# Patient Record
Sex: Male | Born: 1950 | Race: Black or African American | Hispanic: No | Marital: Married | Smoking: Never smoker
Health system: Southern US, Community
[De-identification: ages and names within clinical notes are randomized; demographics above are authoritative.]

## PROBLEM LIST (undated history)

## (undated) DIAGNOSIS — E785 Hyperlipidemia, unspecified: Secondary | ICD-10-CM

## (undated) DIAGNOSIS — I10 Essential (primary) hypertension: Secondary | ICD-10-CM

## (undated) DIAGNOSIS — J189 Pneumonia, unspecified organism: Secondary | ICD-10-CM

## (undated) DIAGNOSIS — E119 Type 2 diabetes mellitus without complications: Secondary | ICD-10-CM

## (undated) DIAGNOSIS — R7303 Prediabetes: Secondary | ICD-10-CM

## (undated) DIAGNOSIS — I7121 Aneurysm of the ascending aorta, without rupture: Secondary | ICD-10-CM

## (undated) DIAGNOSIS — I499 Cardiac arrhythmia, unspecified: Secondary | ICD-10-CM

## (undated) HISTORY — PX: CARDIAC CATHETERIZATION: SHX172

## (undated) HISTORY — PX: OTHER SURGICAL HISTORY: SHX169

---

## 2004-05-21 ENCOUNTER — Emergency Department (HOSPITAL_COMMUNITY): Admission: EM | Admit: 2004-05-21 | Discharge: 2004-05-21 | Payer: Self-pay | Admitting: Emergency Medicine

## 2004-05-23 ENCOUNTER — Emergency Department (HOSPITAL_COMMUNITY): Admission: EM | Admit: 2004-05-23 | Discharge: 2004-05-23 | Payer: Self-pay | Admitting: Emergency Medicine

## 2004-05-30 ENCOUNTER — Ambulatory Visit: Payer: Self-pay | Admitting: Family Medicine

## 2004-06-12 ENCOUNTER — Ambulatory Visit: Payer: Self-pay | Admitting: *Deleted

## 2004-06-23 ENCOUNTER — Ambulatory Visit: Payer: Self-pay | Admitting: Cardiology

## 2004-06-23 ENCOUNTER — Encounter (HOSPITAL_COMMUNITY): Admission: RE | Admit: 2004-06-23 | Discharge: 2004-07-23 | Payer: Self-pay | Admitting: *Deleted

## 2004-06-23 ENCOUNTER — Ambulatory Visit: Payer: Self-pay | Admitting: Family Medicine

## 2004-06-30 ENCOUNTER — Ambulatory Visit: Payer: Self-pay | Admitting: *Deleted

## 2004-07-13 ENCOUNTER — Inpatient Hospital Stay (HOSPITAL_BASED_OUTPATIENT_CLINIC_OR_DEPARTMENT_OTHER): Admission: RE | Admit: 2004-07-13 | Discharge: 2004-07-13 | Payer: Self-pay | Admitting: *Deleted

## 2004-07-13 ENCOUNTER — Ambulatory Visit: Payer: Self-pay | Admitting: *Deleted

## 2004-07-27 ENCOUNTER — Ambulatory Visit: Payer: Self-pay | Admitting: *Deleted

## 2005-02-06 ENCOUNTER — Ambulatory Visit: Payer: Self-pay | Admitting: Family Medicine

## 2005-05-16 ENCOUNTER — Ambulatory Visit: Payer: Self-pay | Admitting: Family Medicine

## 2005-05-17 ENCOUNTER — Encounter (HOSPITAL_COMMUNITY): Admission: RE | Admit: 2005-05-17 | Discharge: 2005-06-16 | Payer: Self-pay | Admitting: Family Medicine

## 2005-11-29 ENCOUNTER — Ambulatory Visit: Payer: Self-pay | Admitting: Family Medicine

## 2006-01-10 ENCOUNTER — Ambulatory Visit: Payer: Self-pay | Admitting: Family Medicine

## 2006-04-29 ENCOUNTER — Ambulatory Visit: Payer: Self-pay | Admitting: Family Medicine

## 2006-04-30 ENCOUNTER — Encounter (INDEPENDENT_AMBULATORY_CARE_PROVIDER_SITE_OTHER): Payer: Self-pay | Admitting: Family Medicine

## 2006-04-30 LAB — CONVERTED CEMR LAB
BUN: 21 mg/dL (ref 6–23)
CO2: 26 meq/L (ref 19–32)
Calcium: 9.8 mg/dL (ref 8.4–10.5)
Chloride: 101 meq/L (ref 96–112)
Creatinine, Ser: 1.36 mg/dL (ref 0.40–1.50)
Glucose, Bld: 91 mg/dL (ref 70–99)
Potassium: 4.8 meq/L (ref 3.5–5.3)
Sodium: 141 meq/L (ref 135–145)

## 2006-07-29 ENCOUNTER — Ambulatory Visit: Payer: Self-pay | Admitting: Family Medicine

## 2006-07-29 DIAGNOSIS — K429 Umbilical hernia without obstruction or gangrene: Secondary | ICD-10-CM | POA: Insufficient documentation

## 2006-07-29 DIAGNOSIS — R7989 Other specified abnormal findings of blood chemistry: Secondary | ICD-10-CM | POA: Insufficient documentation

## 2006-07-29 DIAGNOSIS — E785 Hyperlipidemia, unspecified: Secondary | ICD-10-CM | POA: Insufficient documentation

## 2006-07-29 DIAGNOSIS — I10 Essential (primary) hypertension: Secondary | ICD-10-CM | POA: Insufficient documentation

## 2006-07-29 LAB — CONVERTED CEMR LAB
Cholesterol, target level: 200 mg/dL
HDL goal, serum: 40 mg/dL
LDL Goal: 130 mg/dL

## 2006-08-27 ENCOUNTER — Encounter (INDEPENDENT_AMBULATORY_CARE_PROVIDER_SITE_OTHER): Payer: Self-pay | Admitting: Family Medicine

## 2006-09-27 ENCOUNTER — Ambulatory Visit: Payer: Self-pay | Admitting: Family Medicine

## 2006-11-08 ENCOUNTER — Ambulatory Visit: Payer: Self-pay | Admitting: Family Medicine

## 2006-12-10 ENCOUNTER — Encounter (INDEPENDENT_AMBULATORY_CARE_PROVIDER_SITE_OTHER): Payer: Self-pay | Admitting: Family Medicine

## 2006-12-11 LAB — CONVERTED CEMR LAB
ALT: 19 units/L (ref 0–53)
AST: 19 units/L (ref 0–37)
Albumin: 4.5 g/dL (ref 3.5–5.2)
Alkaline Phosphatase: 81 units/L (ref 39–117)
BUN: 13 mg/dL (ref 6–23)
Basophils Absolute: 0 10*3/uL (ref 0.0–0.1)
Basophils Relative: 1 % (ref 0–1)
CO2: 27 meq/L (ref 19–32)
Calcium: 9.5 mg/dL (ref 8.4–10.5)
Chloride: 103 meq/L (ref 96–112)
Cholesterol: 173 mg/dL (ref 0–200)
Creatinine, Ser: 1.41 mg/dL (ref 0.40–1.50)
Eosinophils Absolute: 0.1 10*3/uL (ref 0.0–0.7)
Eosinophils Relative: 2 % (ref 0–5)
Glucose, Bld: 106 mg/dL — ABNORMAL HIGH (ref 70–99)
HCT: 46.9 % (ref 39.0–52.0)
HDL: 45 mg/dL (ref >39)
Hemoglobin: 15.6 g/dL (ref 13.0–17.0)
LDL Cholesterol: 102 mg/dL — ABNORMAL HIGH (ref 0–99)
Lymphocytes Relative: 43 % (ref 12–46)
Lymphs Abs: 1.8 10*3/uL (ref 0.7–3.3)
MCHC: 33.3 g/dL (ref 30.0–36.0)
MCV: 93.6 fL (ref 78.0–100.0)
Monocytes Absolute: 0.3 10*3/uL (ref 0.2–0.7)
Monocytes Relative: 8 % (ref 3–11)
Neutro Abs: 2 10*3/uL (ref 1.7–7.7)
Neutrophils Relative %: 48 % (ref 43–77)
PSA: 1.61 ng/mL (ref 0.10–4.00)
Platelets: 255 10*3/uL (ref 150–400)
Potassium: 4.3 meq/L (ref 3.5–5.3)
RBC: 5.01 M/uL (ref 4.22–5.81)
RDW: 12.6 % (ref 11.5–14.0)
Sodium: 141 meq/L (ref 135–145)
TSH: 1.548 microintl units/mL (ref 0.350–5.50)
Total Bilirubin: 0.9 mg/dL (ref 0.3–1.2)
Total CHOL/HDL Ratio: 3.8
Total Protein: 7.5 g/dL (ref 6.0–8.3)
Triglycerides: 129 mg/dL (ref <150)
VLDL: 26 mg/dL (ref 0–40)
WBC: 4.2 10*3/uL (ref 4.0–10.5)

## 2006-12-13 ENCOUNTER — Ambulatory Visit: Payer: Self-pay | Admitting: Family Medicine

## 2006-12-13 LAB — CONVERTED CEMR LAB: OCCULT 1: NEGATIVE

## 2007-03-14 ENCOUNTER — Ambulatory Visit: Payer: Self-pay | Admitting: Family Medicine

## 2007-03-17 ENCOUNTER — Telehealth (INDEPENDENT_AMBULATORY_CARE_PROVIDER_SITE_OTHER): Payer: Self-pay | Admitting: *Deleted

## 2007-03-26 ENCOUNTER — Emergency Department (HOSPITAL_COMMUNITY): Admission: EM | Admit: 2007-03-26 | Discharge: 2007-03-26 | Payer: Self-pay | Admitting: Emergency Medicine

## 2007-04-10 ENCOUNTER — Encounter: Payer: Self-pay | Admitting: Family Medicine

## 2007-06-20 ENCOUNTER — Ambulatory Visit: Payer: Self-pay | Admitting: Family Medicine

## 2007-06-21 ENCOUNTER — Encounter (INDEPENDENT_AMBULATORY_CARE_PROVIDER_SITE_OTHER): Payer: Self-pay | Admitting: Family Medicine

## 2007-06-21 LAB — CONVERTED CEMR LAB
BUN: 12 mg/dL (ref 6–23)
CO2: 21 meq/L (ref 19–32)
Calcium: 8.8 mg/dL (ref 8.4–10.5)
Chloride: 102 meq/L (ref 96–112)
Creatinine, Ser: 1.09 mg/dL (ref 0.40–1.50)
Glucose, Bld: 83 mg/dL (ref 70–99)
Potassium: 4.5 meq/L (ref 3.5–5.3)
Sodium: 137 meq/L (ref 135–145)

## 2007-06-23 ENCOUNTER — Telehealth (INDEPENDENT_AMBULATORY_CARE_PROVIDER_SITE_OTHER): Payer: Self-pay | Admitting: *Deleted

## 2007-06-24 ENCOUNTER — Encounter (INDEPENDENT_AMBULATORY_CARE_PROVIDER_SITE_OTHER): Payer: Self-pay | Admitting: Family Medicine

## 2007-07-28 ENCOUNTER — Telehealth (INDEPENDENT_AMBULATORY_CARE_PROVIDER_SITE_OTHER): Payer: Self-pay | Admitting: Family Medicine

## 2007-07-28 ENCOUNTER — Ambulatory Visit: Payer: Self-pay | Admitting: Family Medicine

## 2007-07-28 DIAGNOSIS — J301 Allergic rhinitis due to pollen: Secondary | ICD-10-CM | POA: Insufficient documentation

## 2007-07-28 LAB — CONVERTED CEMR LAB
Bilirubin Urine: NEGATIVE
Blood in Urine, dipstick: NEGATIVE
Glucose, Urine, Semiquant: NEGATIVE
Nitrite: NEGATIVE
Protein, U semiquant: NEGATIVE
Specific Gravity, Urine: 1.02
Urobilinogen, UA: 0.2
WBC Urine, dipstick: NEGATIVE
pH: 5.5

## 2007-08-04 ENCOUNTER — Telehealth (INDEPENDENT_AMBULATORY_CARE_PROVIDER_SITE_OTHER): Payer: Self-pay | Admitting: Family Medicine

## 2007-08-04 ENCOUNTER — Ambulatory Visit: Payer: Self-pay | Admitting: Family Medicine

## 2007-08-25 ENCOUNTER — Ambulatory Visit: Payer: Self-pay | Admitting: Family Medicine

## 2007-08-25 DIAGNOSIS — R609 Edema, unspecified: Secondary | ICD-10-CM | POA: Insufficient documentation

## 2007-09-26 ENCOUNTER — Ambulatory Visit: Payer: Self-pay | Admitting: Family Medicine

## 2007-11-07 ENCOUNTER — Ambulatory Visit: Payer: Self-pay | Admitting: Family Medicine

## 2008-01-02 ENCOUNTER — Ambulatory Visit: Payer: Self-pay | Admitting: Family Medicine

## 2008-04-13 ENCOUNTER — Encounter (INDEPENDENT_AMBULATORY_CARE_PROVIDER_SITE_OTHER): Payer: Self-pay | Admitting: Family Medicine

## 2008-04-16 ENCOUNTER — Ambulatory Visit: Payer: Self-pay | Admitting: Family Medicine

## 2008-05-27 ENCOUNTER — Encounter (INDEPENDENT_AMBULATORY_CARE_PROVIDER_SITE_OTHER): Payer: Self-pay | Admitting: Family Medicine

## 2008-05-31 ENCOUNTER — Ambulatory Visit: Payer: Self-pay | Admitting: Family Medicine

## 2008-05-31 LAB — CONVERTED CEMR LAB
ALT: 20 units/L (ref 0–53)
AST: 18 units/L (ref 0–37)
Albumin: 4.5 g/dL (ref 3.5–5.2)
Alkaline Phosphatase: 82 units/L (ref 39–117)
BUN: 14 mg/dL (ref 6–23)
Basophils Absolute: 0 10*3/uL (ref 0.0–0.1)
Basophils Relative: 1 % (ref 0–1)
CO2: 29 meq/L (ref 19–32)
Calcium: 10 mg/dL (ref 8.4–10.5)
Chloride: 102 meq/L (ref 96–112)
Cholesterol: 191 mg/dL (ref 0–200)
Creatinine, Ser: 1.21 mg/dL (ref 0.40–1.50)
Eosinophils Absolute: 0.1 10*3/uL (ref 0.0–0.7)
Eosinophils Relative: 2 % (ref 0–5)
Glucose, Bld: 84 mg/dL (ref 70–99)
HCT: 48.5 % (ref 39.0–52.0)
HDL: 45 mg/dL (ref >39)
Hemoglobin: 15.7 g/dL (ref 13.0–17.0)
LDL Cholesterol: 95 mg/dL (ref 0–99)
Lymphocytes Relative: 37 % (ref 12–46)
Lymphs Abs: 2.1 10*3/uL (ref 0.7–4.0)
MCHC: 32.4 g/dL (ref 30.0–36.0)
MCV: 95.5 fL (ref 78.0–100.0)
Monocytes Absolute: 0.7 10*3/uL (ref 0.1–1.0)
Monocytes Relative: 11 % (ref 3–12)
Neutro Abs: 2.9 10*3/uL (ref 1.7–7.7)
Neutrophils Relative %: 50 % (ref 43–77)
PSA: 2.87 ng/mL (ref 0.10–4.00)
Platelets: 254 10*3/uL (ref 150–400)
Potassium: 4.3 meq/L (ref 3.5–5.3)
RBC: 5.08 M/uL (ref 4.22–5.81)
RDW: 12.6 % (ref 11.5–15.5)
Sodium: 140 meq/L (ref 135–145)
TSH: 1.414 microintl units/mL (ref 0.350–4.50)
Total Bilirubin: 0.8 mg/dL (ref 0.3–1.2)
Total CHOL/HDL Ratio: 4.2
Total Protein: 7.6 g/dL (ref 6.0–8.3)
Triglycerides: 253 mg/dL — ABNORMAL HIGH (ref <150)
VLDL: 51 mg/dL — ABNORMAL HIGH (ref 0–40)
WBC: 5.8 10*3/uL (ref 4.0–10.5)

## 2008-06-04 ENCOUNTER — Encounter (INDEPENDENT_AMBULATORY_CARE_PROVIDER_SITE_OTHER): Payer: Self-pay | Admitting: Family Medicine

## 2008-06-07 ENCOUNTER — Encounter (INDEPENDENT_AMBULATORY_CARE_PROVIDER_SITE_OTHER): Payer: Self-pay | Admitting: Family Medicine

## 2008-06-07 LAB — CONVERTED CEMR LAB
OCCULT 1: NEGATIVE
OCCULT 2: NEGATIVE
OCCULT 3: NEGATIVE

## 2008-08-31 ENCOUNTER — Ambulatory Visit: Payer: Self-pay | Admitting: Family Medicine

## 2008-11-04 ENCOUNTER — Encounter (INDEPENDENT_AMBULATORY_CARE_PROVIDER_SITE_OTHER): Payer: Self-pay | Admitting: Family Medicine

## 2008-11-30 ENCOUNTER — Ambulatory Visit: Payer: Self-pay | Admitting: Family Medicine

## 2009-01-31 ENCOUNTER — Encounter (INDEPENDENT_AMBULATORY_CARE_PROVIDER_SITE_OTHER): Payer: Self-pay | Admitting: Family Medicine

## 2010-01-03 ENCOUNTER — Encounter: Payer: Self-pay | Admitting: Gastroenterology

## 2010-01-09 ENCOUNTER — Telehealth (INDEPENDENT_AMBULATORY_CARE_PROVIDER_SITE_OTHER): Payer: Self-pay

## 2010-02-03 ENCOUNTER — Encounter (INDEPENDENT_AMBULATORY_CARE_PROVIDER_SITE_OTHER): Payer: Self-pay

## 2010-02-16 ENCOUNTER — Encounter: Payer: Self-pay | Admitting: Gastroenterology

## 2010-02-28 ENCOUNTER — Ambulatory Visit: Payer: Self-pay | Admitting: Gastroenterology

## 2010-02-28 ENCOUNTER — Ambulatory Visit (HOSPITAL_COMMUNITY)
Admission: RE | Admit: 2010-02-28 | Discharge: 2010-02-28 | Payer: Self-pay | Source: Home / Self Care | Admitting: Gastroenterology

## 2010-05-09 NOTE — Assessment & Plan Note (Signed)
Summary: 6 week follow up/arc   Vital Signs:  Patient Profile:   60 Years Old Male Height:     73 inches Weight:      267 pounds BMI:     35.35 O2 Sat:      98 % Temp:     97 degrees F Pulse rate:   84 / minute Resp:     14 per minute BP sitting:   137 / 85  Vitals Entered By: Sherilyn Banker (November 07, 2007 3:57 PM)                 PCP:  Franchot Heidelberg, MD  Chief Complaint:  follow up visit.  History of Present Illness: Pt in for recheck.  He comes in today and states has done well.  He has had a terrible bout of allergic rhinits and stuffed ears. He is using Nasonex and Loratdine and states quit use as worked well. He denies headache, dizzyness and sore throat. He states he likes the nasonex but makes him gag. He denies sneezing, ictchy eyes and sore throat. He is happy with progress. He denies side-effects on Rx except for gag.   He did not bring stool cards back. States forgot as he went on vacation. Has good apetite. Denies nausea and vomitting. No Diarrhea or constipation. No bloody stools.  He now presents.  Hypertension History:      He denies headache, chest pain, palpitations, dyspnea with exertion, orthopnea, PND, peripheral edema, visual symptoms, and neurologic problems.  He notes no problems with any antihypertensive medication side effects.        Positive major cardiovascular risk factors include male age 1 years old or older, hyperlipidemia, and hypertension.  Negative major cardiovascular risk factors include no history of diabetes, negative family history for ischemic heart disease, and non-tobacco-user status.        Further assessment for target organ damage reveals no history of ASHD, stroke/TIA, or peripheral vascular disease.    Lipid Management History:      Positive NCEP/ATP III risk factors include male age 32 years old or older and hypertension.  Negative NCEP/ATP III risk factors include non-diabetic, no family history for ischemic heart  disease, non-tobacco-user status, no ASHD (atherosclerotic heart disease), no prior stroke/TIA, no peripheral vascular disease, and no history of aortic aneurysm.        The patient states that he knows about the "Therapeutic Lifestyle Change" diet.  His compliance with the TLC diet is fair.  The patient expresses understanding of adjunctive measures for cholesterol lowering.  Adjunctive measures started by the patient include aerobic exercise, fiber, and omega-3 supplements.        Prior Medications Reviewed Using: Patient Recall  Updated Prior Medication List: AMLODIPINE BESYLATE 5 MG  TABS (AMLODIPINE BESYLATE) One daily LISINOPRIL-HYDROCHLOROTHIAZIDE 20-12.5 MG  TABS (LISINOPRIL-HYDROCHLOROTHIAZIDE) One daily FISH OIL 1000 MG CAPS (OMEGA-3 FATTY ACIDS) once daily FLAX SEED OIL 1000 MG CAPS (FLAXSEED (LINSEED)) once daily ASPIR-LOW 81 MG TBEC (ASPIRIN) One daily NASONEX 50 MCG/ACT  SUSP (MOMETASONE FUROATE) 2 squirts per nostril once daily LORATADINE 10 MG  TABS (LORATADINE) One daily  Current Allergies (reviewed today): ! * IVP DYE  Past Medical History:    Reviewed history from 06/20/2007 and no changes required:       Current Problems:        MORBID OBESITY (ICD-278.01)       LEG EDEMA, BILATERAL (ICD-782.3)       EUSTACHIAN TUBE DYSFUNCTION (ICD-381.81)  ALLERGIC RHINITIS, SEASONAL (ICD-477.0)       HYPERTENSION (ICD-401)       HERNIA, UMBILICAL (ICD-553.1)       HYPERGLYCEMIA (ICD-790.6)       HYPERLIPIDEMIA (ICD-272.4)                Past Surgical History:    Reviewed history from 09/26/2007 and no changes required:       Denies surgical history       Heart cat - 2006 - normal coronaries    Risk Factors: Tobacco use:  never Drug use:  no Alcohol use:  no  Family History Risk Factors:    Family History of MI in females < 12 years old:  no    Family History of MI in males < 42 years old:  no   Review of Systems  General      Denies chills, fever, and  sweats.  Resp      Denies cough, shortness of breath, sputum productive, and wheezing.  GI      See HPI  GU      Denies nocturia, urinary frequency, and urinary hesitancy.  Neuro      Denies brief paralysis, falling down, headaches, memory loss, seizures, tremors, and weakness.   Physical Exam  General:     Well-developed,well-nourished,in no acute distress; alert,appropriate and cooperative throughout examination. Obese. Lungs:     Normal respiratory effort, chest expands symmetrically. Lungs are clear to auscultation, no crackles or wheezes. Heart:     Normal rate and regular rhythm. S1 and S2 normal without gallop, murmur, click, rub or other extra sounds. Abdomen:     Bowel sounds positive,abdomen soft and non-tender without masses, organomegaly or hernias noted. Extremities:     No clubbing, cyanosis, edema, or deformity noted with normal full range of motion of all joints.   Cervical Nodes:     No lymphadenopathy noted Psych:     Cognition and judgment appear intact. Alert and cooperative with normal attention span and concentration. No apparent delusions, illusions, hallucinations    Impression & Recommendations:  Problem # 1:  HYPERTENSION (ICD-401) Stable. Rx as is. TLC a must. His updated medication list for this problem includes:    Amlodipine Besylate 5 Mg Tabs (Amlodipine besylate) ..... One daily    Lisinopril-hydrochlorothiazide 20-12.5 Mg Tabs (Lisinopril-hydrochlorothiazide) ..... One daily           Problem # 2:  MORBID OBESITY (ICD-278.01) Councelled again on diet,exersize, portion control and caloric intake limit of 2000 calories per day. States will try harder. Aware how eeight loss can improve morbidity and mortality.  Problem # 3:  ALLERGIC RHINITIS, SEASONAL (ICD-477.0) Resume daily Claaritin and try vermayst given "PND and gag" on other nasal spray.   Problem # 4:  EUSTACHIAN TUBE DYSFUNCTION (ICD-381.81) Resolved. Cont allergy  optomization.  Problem # 5:  Preventive Health Care (ICD-V70.0) Failed to bring heme cards back again. Agrees to do so nect week. Aware of cancer and death risk. Declines colonoscopy sighting cost today.  Complete Medication List: 1)  Amlodipine Besylate 5 Mg Tabs (Amlodipine besylate) .... One daily 2)  Lisinopril-hydrochlorothiazide 20-12.5 Mg Tabs (Lisinopril-hydrochlorothiazide) .... One daily 3)  Fish Oil 1000 Mg Caps (Omega-3 fatty acids) .... Once daily 4)  Flax Seed Oil 1000 Mg Caps (Flaxseed (linseed)) .... Once daily 5)  Aspir-low 81 Mg Tbec (Aspirin) .... One daily 6)  Nasonex 50 Mcg/act Susp (Mometasone furoate) .... 2 squirts per nostril once daily 7)  Loratadine  10 Mg Tabs (Loratadine) .... One daily  Hypertension Assessment/Plan:      The patient's hypertensive risk group is category B: At least one risk factor (excluding diabetes) with no target organ damage.  His calculated 10 year risk of coronary heart disease is 11 %.  Today's blood pressure is 137/85.  His blood pressure goal is < 140/90.  Lipid Assessment/Plan:      Based on NCEP/ATP III, the patient's risk factor category is "2 or more risk factors and a calculated 10 year CAD risk of < 20%".  From this information, the patient's calculated lipid goals are as follows: Total cholesterol goal is 200; LDL cholesterol goal is 130; HDL cholesterol goal is 40; Triglyceride goal is 150.     Patient Instructions: 1)  Please schedule a follow-up appointment in 2 months.   ]

## 2010-05-09 NOTE — Assessment & Plan Note (Signed)
Summary: 3 MON F/U   Vital Signs:  Patient Profile:   60 Years Old Male Height:     73 inches Weight:      267 pounds BMI:     35.35 O2 Sat:      98 % Temp:     97.6 degrees F Pulse rate:   89 / minute Resp:     14 per minute BP sitting:   146 / 95  Vitals Entered By: Sherilyn Banker (March 14, 2007 3:47 PM)                 PCP:  Franchot Heidelberg, MD  Chief Complaint:  follow up visit.  History of Present Illness: Pt in for recheck.  He states he has begun exersize and has been weight lifting. He is eating healthier including fruits and veggies. He notes he has been juicing on fruits and loves this. His weight has gonbe up from previous 261 to 267 with Thanksgiving.  He admits he never brought stool cards back and he will bring this back. He will get this back asap. He still does not want the colonscopy. His apetite is good. Denies nausea and vomititng. No diarrhea or constipation. No bloody stool.  Now presents.  Hypertension History:      He denies headache, chest pain, palpitations, dyspnea with exertion, orthopnea, PND, peripheral edema, visual symptoms, neurologic problems, syncope, and side effects from treatment.  Further comments include: Has missed BP pills last two days. Marland Kitchen        Positive major cardiovascular risk factors include male age 51 years old or older, hyperlipidemia, and hypertension.  Negative major cardiovascular risk factors include no history of diabetes, negative family history for ischemic heart disease, and non-tobacco-user status.        Further assessment for target organ damage reveals no history of ASHD, stroke/TIA, or peripheral vascular disease.    Lipid Management History:      Positive NCEP/ATP III risk factors include male age 69 years old or older and hypertension.  Negative NCEP/ATP III risk factors include non-diabetic, no family history for ischemic heart disease, non-tobacco-user status, no ASHD (atherosclerotic heart disease), no  prior stroke/TIA, no peripheral vascular disease, and no history of aortic aneurysm.        The patient states that he knows about the "Therapeutic Lifestyle Change" diet.  His compliance with the TLC diet is fair.  The patient expresses understanding of adjunctive measures for cholesterol lowering.  Adjunctive measures started by the patient include aerobic exercise, fiber, ASA, and omega-3 supplements.      Current Allergies (reviewed today): ! * IVP DYE  Past Medical History:    Reviewed history from 07/29/2006 and no changes required:       Hyperlipidemia       Hypertension  Past Surgical History:    Reviewed history from 07/29/2006 and no changes required:       Denies surgical history   Family History:    Reviewed history from 07/29/2006 and no changes required:       Father: CAD; CVA; MI Dead at 2       Mother: CVA; DM Dead at 68       1 sister: Colon CA 28  Social History:    Reviewed history from 07/29/2006 and no changes required:       Married       Never Smoked       Alcohol use-no  Drug use-no       Occupation: Best boy for [employer]    Review of Systems      See HPI  General      Denies fatigue and malaise.  Resp      Denies cough, shortness of breath, sputum productive, and wheezing.  GI      Denies abdominal pain, constipation, diarrhea, nausea, and vomiting.  GU      Denies decreased libido, nocturia, urinary frequency, and urinary hesitancy.   Physical Exam  General:     Well-developed,well-nourished,in no acute distress; alert,appropriate and cooperative throughout examination Lungs:     Normal respiratory effort, chest expands symmetrically. Lungs are clear to auscultation, no crackles or wheezes. Heart:     Normal rate and regular rhythm. S1 and S2 normal without gallop, murmur, click, rub or other extra sounds. Abdomen:     Bowel sounds positive,abdomen soft and non-tender without masses, organomegaly. Small umbilical  hernia noted. No strangulation. Extremities:     No clubbing, cyanosis, edema, or deformity noted with normal full range of motion of all joints.   Psych:     Cognition and judgment appear intact. Alert and cooperative with normal attention span and concentration. No apparent delusions, illusions, hallucinations    Impression & Recommendations:  Problem # 1:  HYPERTENSION (ICD-401) Discussed.  He is not using meds daily. Councelled risk. Agrees he will try harder. Advised stroke and CAD risk. Limit salt. Exersize anmd weight loss a must. Aware. His updated medication list for this problem includes:    Norvasc 5 Mg Tabs (Amlodipine besylate) ..... Once daily    Lisinopril-hydrochlorothiazide 20-25 Mg Tabs (Lisinopril-hydrochlorothiazide) ..... Once daily   Problem # 2:  HYPERGLYCEMIA (ICD-790.6) See above.  Problem # 3:  HYPERLIPIDEMIA (ICD-272.4) TLC only. Councelled on diet and exersize and low fat intake.  Problem # 4:  Preventive Health Care (ICD-V70.0) Discussed. Needs heme-occults. Refuses colonoscopy.Aware of cancer risk.   Problem # 5:  Non compliance Discussed. Advised need to work with [place] as we cannot optomize his care unless he helps [place]. He is aware. Await stool cards. Take BP meds daily.  Complete Medication List: 1)  Norvasc 5 Mg Tabs (Amlodipine besylate) .... Once daily 2)  Lisinopril-hydrochlorothiazide 20-25 Mg Tabs (Lisinopril-hydrochlorothiazide) .... Once daily 3)  Fish Oil 1000 Mg Caps (Omega-3 fatty acids) .... Once daily 4)  Flax Seed Oil 1000 Mg Caps (Flaxseed (linseed)) .... Once daily 5)  Aspir-low 81 Mg Tbec (Aspirin) .... One daily  Hypertension Assessment/Plan:      The patient's hypertensive risk group is category B: At least one risk factor (excluding diabetes) with no target organ damage.  His calculated 10 year risk of coronary heart disease is 11 %.  Today's blood pressure is 146/95.  His blood pressure goal is < 140/90.  Lipid  Assessment/Plan:      Based on NCEP/ATP III, the patient's risk factor category is "2 or more risk factors and a calculated 10 year CAD risk of < 20%".  From this information, the patient's calculated lipid goals are as follows: Total cholesterol goal is 200; LDL cholesterol goal is 130; HDL cholesterol goal is 40; Triglyceride goal is 150.     Patient Instructions: 1)  Please schedule a follow-up appointment in 3 months.    Prescriptions: LISINOPRIL-HYDROCHLOROTHIAZIDE 20-25 MG TABS (LISINOPRIL-HYDROCHLOROTHIAZIDE) once daily  #30 x 3   Entered and Authorized by:   Franchot Heidelberg MD   Signed by:   Franchot Heidelberg  MD on 03/14/2007   Method used:   Print then Give to Patient   RxID:   [id number] NORVASC 5 MG TABS (AMLODIPINE BESYLATE) once daily  #30 x 3   Entered and Authorized by:   Franchot Heidelberg MD   Signed by:   Franchot Heidelberg MD on 03/14/2007   Method used:   Print then Give to Patient   RxID:   [id number]  ]

## 2010-05-09 NOTE — Assessment & Plan Note (Signed)
Summary: FOLLOW UP/ARC   Vital Signs:  Patient Profile:   60 Years Old Male Height:     73 inches Weight:      261 pounds BMI:     34.56 O2 Sat:      99 % Temp:     97.6 degrees F Pulse rate:   100 / minute Resp:     16 per minute BP sitting:   136 / 88  Vitals Entered By: Sherilyn Banker (September 27, 2006 4:32 PM)               PCP:  Franchot Heidelberg, MD  Chief Complaint:  follow up visit.  History of Present Illness: Pt in for recheck.  He notes he is doing well.  He has HTN and is using meds as directed. he is hopeful he can get BP to 120/80 and stop meds with diet and exersize. he notes no chest pain, shortness of breath, orthopnea, PND and ankle edema. Taking meds daily. Not exersizing too much. No side-effects on medication.  He is due for lipids as he have a hx of this and has not had it done in 2 years. he has been putting a lot of his care of sighting cost concerns after a heart eval  that he is till paying for. he did call his insuarnce company and was told they will not pay for PSA but $70. They could not tell him about colon cancer screen unless I provided code for this. Curios if I can give him this.  He notes he is very reluctant to have evals due to cost concerns and I should understand he does not want to end up broke with tests.  Now presents.  Hypertension History:      He denies headache, chest pain, palpitations, dyspnea with exertion, orthopnea, PND, peripheral edema, visual symptoms, neurologic problems, syncope, and side effects from treatment.  He notes no problems with any antihypertensive medication side effects.        Positive major cardiovascular risk factors include male age 35 years old or older, hyperlipidemia, and hypertension.  Negative major cardiovascular risk factors include no history of diabetes, negative family history for ischemic heart disease, and non-tobacco-user status.        Further assessment for target organ damage reveals no  history of ASHD, stroke/TIA, or peripheral vascular disease.    Lipid Management History:      Positive NCEP/ATP III risk factors include male age 94 years old or older and hypertension.  Negative NCEP/ATP III risk factors include non-diabetic, no family history for ischemic heart disease, non-tobacco-user status, no ASHD (atherosclerotic heart disease), no prior stroke/TIA, no peripheral vascular disease, and no history of aortic aneurysm.        The patient states that he knows about the "Therapeutic Lifestyle Change" diet.  His compliance with the TLC diet is fair.  The patient expresses understanding of adjunctive measures for cholesterol lowering.  Adjunctive measures started by the patient include aerobic exercise, fiber, ASA, and omega-3 supplements.  Comments: Due for lipids.    Current Allergies (reviewed today): ! * IVP DYE   Family History:    Reviewed history from 07/29/2006 and no changes required:       Father: CAD; CVA; MI Dead at 69       Mother: CVA; DM Dead at 80       1 sister: Colon CA 93  Social History:    Reviewed history from 07/29/2006 and no changes  required:       Married       Never Smoked       Alcohol use-no       Drug use-no       Occupation: Best boy for [employer]    Review of Systems      See HPI  General      Denies fatigue and malaise.  Resp      Denies cough, shortness of breath, sputum productive, and wheezing.  GI      Denies abdominal pain, change in bowel habits, constipation, dark tarry stools, diarrhea, nausea, and vomiting.  GU      Denies decreased libido, erectile dysfunction, nocturia, urinary frequency, and urinary hesitancy.  Psych      Denies anxiety and depression.   Physical Exam  General:     Well-developed,well-nourished,in no acute distress; alert,appropriate and cooperative throughout examination Lungs:     Normal respiratory effort, chest expands symmetrically. Lungs are clear to auscultation, no  crackles or wheezes. Heart:     Normal rate and regular rhythm. S1 and S2 normal without gallop, murmur, click, rub or other extra sounds. Abdomen:     Bowel sounds positive,abdomen soft and non-tender without masses, organomegaly. Small umbilical hernia noted. No strangulation. Extremities:     No clubbing, cyanosis, edema, or deformity noted with normal full range of motion of all joints.      Impression & Recommendations:  Problem # 1:  HYPERTENSION (ICD-401) Discussed. BP at goal per JNC 7. Councelled diet, exersize and low salt intake. Advised yearly eye exam and educated on need for dental care. Advised we can certainly look at backing down on meds if he can get BP down with weight loss and TLC. His updated medication list for this problem includes:    Norvasc 5 Mg Tabs (Amlodipine besylate) ..... Once daily    Lisinopril-hydrochlorothiazide 20-25 Mg Tabs (Lisinopril-hydrochlorothiazide) ..... Once daily  Orders: T-CBC w/Diff ([id number])   Problem # 2:  HYPERLIPIDEMIA (ICD-272.4) Discussed need to optomize given risk for atherosclerosis. Councelled diet and exersize. Repeat lipid and optomize per atp iii. He is going to check with insurance on cost of tests and out of pocket expense before decoiding if he wants test. Aware of risk and benfit of not getting this checked. Orders: T-Comprehensive Metabolic Panel [phone]) T-Lipid Profile [phone]) T-TSH [phone])   Problem # 3:  HERNIA, UMBILICAL (ICD-553.1) Stable. Follow. Discussed signs of complication and need to notify me immediately if any change. Not open to surgical eval at this time.  Problem # 4:  Preventive Health Care (ICD-V70.0) Discussed PSA, Recta and colonoscopy. Reviewed USPTF guidelines on screnning and risk for malignancy and death  if not done. He again notes he will check with insurance before doing anything as he does not want expense. Councelled risk and benefits. He is well aware. Advised I  cannot help him if he does not do anything due to cost - this wastes his time and money. He will think about this and check with insurance. he will then get back to me - hence we will see him in 6 weeks and optomize as he allows.   Other Orders: T-PSA  ([id number])  Hypertension Assessment/Plan:      The patient's hypertensive risk group is category B: At least one risk factor (excluding diabetes) with no target organ damage.  Today's blood pressure is 136/88.  His blood pressure goal is < 140/90.  Lipid Assessment/Plan:  Based on NCEP/ATP III, the patient's risk factor category is "2 or more risk factors and a calculated 10 year CAD risk of < 20%".  From this information, the patient's calculated lipid goals are as follows: Total cholesterol goal is 200; LDL cholesterol goal is 130; HDL cholesterol goal is 40; Triglyceride goal is 150.     Patient Instructions: 1)  Please schedule a follow-up appointment in 6 weeks.

## 2010-05-09 NOTE — Assessment & Plan Note (Signed)
Summary: 3 month follow up/arc   Vital Signs:  Patient profile:   60 year old male Height:      73 inches Weight:      261 pounds O2 Sat:      96 % Pulse rate:   88 / minute Resp:     20 per minute BP sitting:   130 / 86  (left arm) Cuff size:   large  Vitals Entered By: Carlye Grippe (November 30, 2008 3:52 PM) CC: follow-up visit BP, Lipid Management, Hypertension Management   Primary Provider:  Franchot Heidelberg, MD  CC:  follow-up visit BP, Lipid Management, and Hypertension Management.  History of Present Illness: Pt in for rcheck.  He notes he is doing well.  He admits he still misses an occasional dose on meds and states knows better.  He denies side-effects on medications. He denies chest pain, SOB, orthopnea, PND and palpitations. Denies leg swelling. Notes socks may dent shin a little at end of long day.  He limits salt. He uis exersizing and does walkimg tapes at home. States does mile daily now. Weight down two more pounds.   He now presents.  Hypertension History:      He denies headache, chest pain, palpitations, dyspnea with exertion, orthopnea, PND, peripheral edema, visual symptoms, neurologic problems, syncope, and side effects from treatment.  Further comments include: See HPI. Marland Kitchen        Positive major cardiovascular risk factors include male age 45 years old or older, hyperlipidemia, and hypertension.  Negative major cardiovascular risk factors include no history of diabetes, negative family history for ischemic heart disease, and non-tobacco-user status.        Further assessment for target organ damage reveals no history of ASHD, stroke/TIA, or peripheral vascular disease.    Lipid Management History:      Positive NCEP/ATP III risk factors include male age 1 years old or older and hypertension.  Negative NCEP/ATP III risk factors include non-diabetic, no family history for ischemic heart disease, non-tobacco-user status, no ASHD (atherosclerotic heart  disease), no prior stroke/TIA, no peripheral vascular disease, and no history of aortic aneurysm.        The patient states that he knows about the "Therapeutic Lifestyle Change" diet.  His compliance with the TLC diet is fair.  The patient expresses understanding of adjunctive measures for cholesterol lowering.  Comments: He is using fish and flaxseed. Needs repeat done.  States poor covergae on insurance and scared lab may not be covered. States more of discount program and looking for new insurance now. .    Current Medications (verified): 1)  Amlodipine Besylate 5 Mg  Tabs (Amlodipine Besylate) .... One Daily 2)  Lisinopril-Hydrochlorothiazide 20-12.5 Mg  Tabs (Lisinopril-Hydrochlorothiazide) .... Two Times A Day 3)  Fish Oil 1000 Mg Caps (Omega-3 Fatty Acids) .... Once Daily 4)  Flax Seed Oil 1000 Mg Caps (Flaxseed (Linseed)) .... Once Daily 5)  Aspir-Low 81 Mg Tbec (Aspirin) .... One Daily 6)  Veramyst 27.5 Mcg/spray Susp (Fluticasone Furoate) .... Two Squirts Per Nostril Daily 7)  Loratadine 10 Mg  Tabs (Loratadine) .... One Daily  Allergies (verified): 1)  ! * Ivp Dye  Past History:  Past Medical History: Last updated: 11/07/2007 Current Problems:  MORBID OBESITY (ICD-278.01) LEG EDEMA, BILATERAL (ICD-782.3) EUSTACHIAN TUBE DYSFUNCTION (ICD-381.81) ALLERGIC RHINITIS, SEASONAL (ICD-477.0) HYPERTENSION (ICD-401) HERNIA, UMBILICAL (ICD-553.1) HYPERGLYCEMIA (ICD-790.6) HYPERLIPIDEMIA (ICD-272.4)  Past Surgical History: Last updated: 08/31/2008 Denies surgical history Heart cath - 2006 - normal coronaries  Family  History: Last updated: 08/31/2008 Father: CAD; CVA; MI Dead at 25 Mother: CVA; DM Dead at 11 1 sister: Colon CA 64 - dx at age 67, now also with DM (dx at age 23) Kids - Step-son - age 34 and healthy  Social History: Last updated: 08/31/2008 Married Never Smoked Alcohol use-no Drug use-no Occupation: Best boy for [employer] Education: [employer], commercial - assosciates in [employer] Lives with wife  Risk Factors: Smoking Status: never (08/31/2008)  Review of Systems      See HPI General:  Denies chills, fever, and sweats. Resp:  Denies cough, shortness of breath, sputum productive, and wheezing. GI:  Denies abdominal pain, constipation, diarrhea, nausea, and vomiting; He had negative heme-occults. Sister dx with colon cancer in early 65s. States may dfo if isnurance can be better. . GU:  Denies nocturia, urinary frequency, and urinary hesitancy; PSA due next year.Marland Kitchen Psych:  Denies anxiety and depression.  Physical Exam  General:  Well-developed,well-nourished,in no acute distress; alert,appropriate and cooperative throughout examination Lungs:  Normal respiratory effort, chest expands symmetrically. Lungs are clear to auscultation, no crackles or wheezes. Heart:  Normal rate and regular rhythm. S1 and S2 normal without gallop, murmur, click, rub or other extra sounds. Abdomen:  Bowel sounds positive,abdomen soft and non-tender without masses, organomegaly with grape sized umbilical hernia at 12. No pain and present for years. Extremities:  No clubbing, cyanosis, edema, or deformity noted with normal full range of motion of all joints.   Cervical Nodes:  No lymphadenopathy noted Psych:  Cognition and judgment appear intact. Alert and cooperative with normal attention span and concentration. No apparent delusions, illusions, hallucinations   Impression & Recommendations:  Problem # 1:  HYPERTENSION (ICD-401) Still has rouble taking RX daily. Urged to do so. He needs repeat renal fucntion and states with current insurance and cost cannot afford. States will look into better insurance. Advised repeat lab at latest in 2 to 3 months. Agrees. He wil have this with new PCP as I am relocating. Gave info on local MDs and agreeable.  Limitsalt, cont weight loss with diet and exersize. Councelled eye and dental care and advised again why  improtant to follow BMP and UA with HTN. His updated medication list for this problem includes:    Amlodipine Besylate 5 Mg Tabs (Amlodipine besylate) ..... One daily    Lisinopril-hydrochlorothiazide 20-12.5 Mg Tabs (Lisinopril-hydrochlorothiazide) .Marland Kitchen..Marland Kitchen Two times a day  Problem # 2:  HYPERLIPIDEMIA (ICD-272.4) See above. Cost concerns with poor insurance. Repeat LFTS and Lipids in 2 to 3 months as hoepful can have better insurance by then. If Trig still high, optomize Rx with Nisapn or similar. Again, TLC diet and exersize a must.  Problem # 3:  MORBID OBESITY (ICD-278.01) See above. Weight down. Cont diet, exersize and portion control.  Problem # 4:  SPECIAL SCREENING FOR MALIGNANT NEOPLASMS COLON (ICD-V76.51) Again advised need for colonoscopy with fam hx of early colon cancer. Advised neg heme-occult and somwhat reassuring but still needs study. States as with labs may do this Fall pending insurance issues resolve.  Problem # 5:  Preventive Health Care (ICD-V70.0) Advoisef flu and H1N1 vaccine this fall and will defer optomization to new MD.  Complete Medication List: 1)  Amlodipine Besylate 5 Mg Tabs (Amlodipine besylate) .... One daily 2)  Lisinopril-hydrochlorothiazide 20-12.5 Mg Tabs (Lisinopril-hydrochlorothiazide) .... Two times a day 3)  Fish Oil 1000 Mg Caps (Omega-3 fatty acids) .... Once daily 4)  Flax Seed Oil 1000 Mg  Caps (Flaxseed (linseed)) .... Once daily 5)  Aspir-low 81 Mg Tbec (Aspirin) .... One daily 6)  Veramyst 27.5 Mcg/spray Susp (Fluticasone furoate) .... Two squirts per nostril daily 7)  Loratadine 10 Mg Tabs (Loratadine) .... One daily  Hypertension Assessment/Plan:      The patient's hypertensive risk group is category B: At least one risk factor (excluding diabetes) with no target organ damage.  His calculated 10 year risk of coronary heart disease is 6 %.  Today's blood pressure is 130/86.  His blood pressure goal is < 140/90.  Lipid Assessment/Plan:       Based on NCEP/ATP III, the patient's risk factor category is "2 or more risk factors and a calculated 10 year CAD risk of < 20%".  The patient's lipid goals are as follows: Total cholesterol goal is 200; LDL cholesterol goal is 130; HDL cholesterol goal is 40; Triglyceride goal is 150.    Patient Instructions: 1)  Please schedule a follow-up appointment in 2 to3 months with new PCP as I am relocating.  Prescriptions: LORATADINE 10 MG  TABS (LORATADINE) One daily  #30 x 3   Entered and Authorized by:   Franchot Heidelberg MD   Signed by:   Franchot Heidelberg MD on 11/30/2008   Method used:   Electronically to        [facility] [phone]* (retail)       [address]       [place], [place]  [address]       Ph: [phone] or [phone]       Fax: [phone]   RxID:   [id number] VERAMYST 27.5 MCG/SPRAY SUSP (FLUTICASONE FUROATE) Two squirts per nostril daily  #1 x 3   Entered and Authorized by:   Franchot Heidelberg MD   Signed by:   Franchot Heidelberg MD on 11/30/2008   Method used:   Electronically to        [facility] [phone]* (retail)       [address]       [place], [place]  [address]       Ph: [phone] or [phone]       Fax: [phone]   RxID:   [id number] LISINOPRIL-HYDROCHLOROTHIAZIDE 20-12.5 MG  TABS (LISINOPRIL-HYDROCHLOROTHIAZIDE) two times a day  #60 x 3   Entered and Authorized by:   Franchot Heidelberg MD   Signed by:   Franchot Heidelberg MD on 11/30/2008   Method used:   Electronically to        [facility] [phone]* (retail)       [address]       [place], [place]  [address]       Ph: [phone] or [phone]       Fax: [phone]   RxID:   [id number] AMLODIPINE BESYLATE 5 MG  TABS (AMLODIPINE BESYLATE) One daily  #90 x 0   Entered and Authorized by:   Franchot Heidelberg MD   Signed by:   Franchot Heidelberg MD on 11/30/2008   Method used:   Electronically to        [facility] [phone]* (retail)       [address]       [place], [place]  [address]       Ph: [phone] or [phone]  Fax: [phone]   RxID:   [id number]

## 2010-05-09 NOTE — Assessment & Plan Note (Signed)
Summary: 3 MONTH FOLLOW UP/ARC   Vital Signs:  Patient Profile:   61 Years Old Male Height:     73 inches Weight:      257 pounds BMI:     34.03 O2 Sat:      100 % Temp:     98.1 degrees F Pulse rate:   65 / minute Resp:     16 per minute BP sitting:   147 / 101  Vitals Entered By: Sherilyn Banker (July 29, 2006 4:28 PM)                Chief Complaint:  follow up visit.  Hypertension History:      He denies headache, chest pain, palpitations, dyspnea with exertion, orthopnea, PND, peripheral edema, visual symptoms, neurologic problems, syncope, and side effects from treatment.  He notes no problems with any antihypertensive medication side effects.  Further comments include: Pt did not take BP meds all weekend. Notes he just fell off schedule a bit. He is watching salt intake. He does not add salt to food. He is exersizing by lifting weights. .        Positive major cardiovascular risk factors include male age 59 years old or older, hyperlipidemia, and hypertension.  Negative major cardiovascular risk factors include no history of diabetes, negative family history for ischemic heart disease, and non-tobacco-user status.        Further assessment for target organ damage reveals no history of ASHD, stroke/TIA, or peripheral vascular disease.    Lipid Management History:      Positive NCEP/ATP III risk factors include male age 67 years old or older and hypertension.  Negative NCEP/ATP III risk factors include non-diabetic, no family history for ischemic heart disease, non-tobacco-user status, no ASHD (atherosclerotic heart disease), no prior stroke/TIA, no peripheral vascular disease, and no history of aortic aneurysm.        The patient states that he does not know about the "Therapeutic Lifestyle Change" diet.  His compliance with the TLC diet is fair.  The patient does not know about adjunctive measures for cholesterol lowering.  Adjunctive measures started by the patient include aerobic  exercise, fiber, ASA, and omega-3 supplements.  Comments: Pt had lipids done in 2006 under Dr. Early Chars - LDL 105 with Trig 165.    Prior Medications (reviewed today): NORVASC 5 MG TABS (AMLODIPINE BESYLATE) once daily LISINOPRIL-HYDROCHLOROTHIAZIDE 20-25 MG TABS (LISINOPRIL-HYDROCHLOROTHIAZIDE) once daily FISH OIL 1000 MG CAPS (OMEGA-3 FATTY ACIDS) once daily FLAX SEED OIL 1000 MG CAPS (FLAXSEED (LINSEED)) once daily Current Allergies (reviewed today): ! * IVP DYE  Past Surgical History:    Heart Cath: July 13, 2004:  Normal coronaries   Family History:    Reviewed history and no changes required:       Father: CAD; CVA; MI Dead at 43       Mother: CVA; DM Dead at 71       1 sister: Colon CA 36  Social History:    Reviewed history and no changes required:       Married       Never Smoked       Alcohol use-no       Drug use-no       Occupation: Best boy for [employer]   Risk Factors:  Tobacco use:  never Drug use:  no Alcohol use:  no  Family History Risk Factors:    Family History of MI in females < 68 years old:  no    Family History of MI in males < 64 years old:  no   Review of Systems      See HPI   Physical Exam  General:     Well-developed,well-nourished,in no acute distress; alert,appropriate and cooperative throughout examination Lungs:     Normal respiratory effort, chest expands symmetrically. Lungs are clear to auscultation, no crackles or wheezes. Heart:     Normal rate and regular rhythm. S1 and S2 normal without gallop, murmur, click, rub or other extra sounds. Abdomen:     Bowel sounds positive,abdomen soft and non-tender without masses, organomegaly or hernias noted. Extremities:     No clubbing, cyanosis, edema, or deformity noted with normal full range of motion of all joints.   Cervical Nodes:     No lymphadenopathy noted Psych:     Cognition and judgment appear intact. Alert and cooperative with normal attention span and  concentration. No apparent delusions, illusions, hallucinations    Impression & Recommendations:  Problem # 1:  HYPERTENSION (ICD-401) BP high. Not taking meds as directed - skeptical he takes it daily as he did talk to me today about getting off medication. Questioned on cost and he notes he can afford it. Councelled on diet, exersize and need for low salt. Advised yearly eye check. Recheck 2 months and optomize per JNC 7. His updated medication list for this problem includes:    Norvasc 5 Mg Tabs (Amlodipine besylate) ..... Once daily    Lisinopril-hydrochlorothiazide 20-25 Mg Tabs (Lisinopril-hydrochlorothiazide) ..... Once daily   Problem # 2:  HYPERLIPIDEMIA (ICD-272.4) Repeat lipids on return.  Problem # 3:  Preventive Health Care (ICD-V70.0) Needs PSA, rectal and colonoscopy given strong fam hx of colon cancer age 64 in a sister. Advised on this - again with risk of cancer and death outlined. Pt sights he is going to call his insurance company and see if they will cover cost. Advised on the importance of this USPTF guidelines. He is well aware.  Problem # 4:  Allergic reaction to IV contrast Info given. Advised need to complete paperwork for medical alert bracelet or necklace.  Medications Added to Medication List This Visit: 1)  Aspir-low 81 Mg Tbec (Aspirin) .... One daily  Hypertension Assessment/Plan:      The patient's hypertensive risk group is category B: At least one risk factor (excluding diabetes) with no target organ damage.  Today's blood pressure is 147/101.  His blood pressure goal is < 140/90.  Lipid Assessment/Plan:      Based on NCEP/ATP III, the patient's risk factor category is "2 or more risk factors and a calculated 10 year CAD risk of < 20%".  From this information, the patient's calculated lipid goals are as follows: Total cholesterol goal is 200; LDL cholesterol goal is 130; HDL cholesterol goal is 40; Triglyceride goal is 150.     Patient  Instructions: 1)  Please schedule a follow-up appointment in 2 months - sooner if needed.

## 2010-05-09 NOTE — Letter (Signed)
Summary: Historic office notes,labs, med. history  Historic office notes,labs, med. history   Imported By: Curtis Sites 01/31/2009 13:25:42  _____________________________________________________________________  External Attachment:    Type:   Image     Comment:   External Document

## 2010-05-09 NOTE — Assessment & Plan Note (Signed)
Summary: 3 MONTH FOLLOW UP/ARC   Vital Signs:  Patient Profile:   60 Years Old Male Height:     73 inches Weight:      271 pounds BMI:     35.88 O2 Sat:      100 % Pulse rate:   78 / minute Resp:     14 per minute BP sitting:   140 / 86  Vitals Entered By: Sherilyn Banker (April 16, 2008 4:15 PM)                 PCP:  Franchot Heidelberg, MD  Chief Complaint:  follow up visit.  History of Present Illness: Pt in for recheck.  He states he ran out of meds a few days ago and did not take for 3 to 4 days due to insurance issues  -told was not in the system after getting new benefit package and then finally was found in the system. States filled and has been on this for 2 days. States cost $22 to fill. States gets Rx at [facility] and curious about cheaper medications. States was $10 before.  He denies chest pain, SOB, orthopnea, PND and palpitations. Denies leg swelling. He is watching salt. He is walking for exersize.  States has slacked off with Xmas. Was lifting weights and walking but not as much now. Will get busy again.   He denies cough and wheezing. States allergies has done great. Has not used Rx daily as he does not need it.   He has refused colonoscopy and has not done stool cards yet. States scared to do until know what benefits are with new insurance. He states needs to call to find out if they will cover this. He denies abdominal pain, nausea and vomitting, diarrhea and constipation. No bloody stools noted.   He denies urianry symptoms.  He now presents.  Lipid Management History:      Positive NCEP/ATP III risk factors include male age 50 years old or older and hypertension.  Negative NCEP/ATP III risk factors include non-diabetic, no family history for ischemic heart disease, non-tobacco-user status, no ASHD (atherosclerotic heart disease), no prior stroke/TIA, no peripheral vascular disease, and no history of aortic aneurysm.        The patient states that he knows  about the "Therapeutic Lifestyle Change" diet.  His compliance with the TLC diet is fair.  The patient expresses understanding of adjunctive measures for cholesterol lowering.  Adjunctive measures started by the patient include aerobic exercise, fiber, and omega-3 supplements.  Comments: Agrees with lab.     Prior Medications Reviewed Using: Patient Recall  Updated Prior Medication List: AMLODIPINE BESYLATE 5 MG  TABS (AMLODIPINE BESYLATE) One daily LISINOPRIL-HYDROCHLOROTHIAZIDE 20-12.5 MG  TABS (LISINOPRIL-HYDROCHLOROTHIAZIDE) One daily FISH OIL 1000 MG CAPS (OMEGA-3 FATTY ACIDS) once daily FLAX SEED OIL 1000 MG CAPS (FLAXSEED (LINSEED)) once daily ASPIR-LOW 81 MG TBEC (ASPIRIN) One daily VERAMYST 27.5 MCG/SPRAY SUSP (FLUTICASONE FUROATE) Two squirts per nostril daily LORATADINE 10 MG  TABS (LORATADINE) One daily  Current Allergies (reviewed today): ! * IVP DYE  Past Medical History:    Reviewed history from 11/07/2007 and no changes required:       Current Problems:        MORBID OBESITY (ICD-278.01)       LEG EDEMA, BILATERAL (ICD-782.3)       EUSTACHIAN TUBE DYSFUNCTION (ICD-381.81)       ALLERGIC RHINITIS, SEASONAL (ICD-477.0)       HYPERTENSION (ICD-401)  HERNIA, UMBILICAL (ICD-553.1)       HYPERGLYCEMIA (ICD-790.6)       HYPERLIPIDEMIA (ICD-272.4)                Past Surgical History:    Reviewed history from 09/26/2007 and no changes required:       Denies surgical history       Heart cat - 2006 - normal coronaries   Family History:    Reviewed history from 07/29/2006 and no changes required:       Father: CAD; CVA; MI Dead at 36       Mother: CVA; DM Dead at 62       1 sister: Colon CA 96  Social History:    Reviewed history from 07/29/2006 and no changes required:       Married       Never Smoked       Alcohol use-no       Drug use-no       Occupation: Best boy for [employer]    Review of Systems      See HPI   Physical  Exam  General:     Well-developed,well-nourished,in no acute distress; alert,appropriate and cooperative throughout examination Lungs:     Normal respiratory effort, chest expands symmetrically. Lungs are clear to auscultation, no crackles or wheezes. Heart:     Normal rate and regular rhythm. S1 and S2 normal without gallop, murmur, click, rub or other extra sounds. Abdomen:     Bowel sounds positive,abdomen soft and non-tender without masses, organomegaly with graoe sized umbilical hernia at 12. No pain and present for years. Extremities:     No clubbing, cyanosis, edema, or deformity noted with normal full range of motion of all joints.   Psych:     Cognition and judgment appear intact. Alert and cooperative with normal attention span and concentration. No apparent delusions, illusions, hallucinations    Impression & Recommendations:  Problem # 1:  HYPERTENSION (ICD-401) Mild elevation. Off meds for few days. Cont as is and recheck 4 weeks. He is hesistant to do labs and has not dfone his part with stool cards. Advised cannot care appropriately if we do not get labs. Agree. He will call insurance and verify payment for studies. Do next week. Await lab and optomize. If insurance does not cover lab, which I doubt, will have to look at altenrative eval and cash pay.  His updated medication list for this problem includes:    Amlodipine Besylate 5 Mg Tabs (Amlodipine besylate) ..... One daily    Lisinopril-hydrochlorothiazide 20-12.5 Mg Tabs (Lisinopril-hydrochlorothiazide) ..... One daily  Orders: T-Comprehensive Metabolic Panel [id number]) T-CBC w/Diff ([id number])   Problem # 2:  HYPERLIPIDEMIA (ICD-272.4) Repeat labs and optomize per ATP III. Orders: T-Comprehensive Metabolic Panel [id number]) T-Lipid Profile [id number]) T-TSH [id number])   Problem # 3:  HERNIA, UMBILICAL (ICD-553.1) Stable. No signsmof stangulation. Offered surgical eval and declines. Advised  strangulation sign and asked for immediate update if occurs. Wear support belt when lifting weights given risk for herniation. Agrees.  Problem # 4:  MORBID OBESITY (ICD-278.01) TLC diet advised.  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 meds as below. May use OTC Mucinex for cough and congestion but avoid all sudafed type products given HTN. Agrees.  Problem # 6:  Preventive Health Care (ICD-V70.0) Check PSA. Rectal next visit. Councelled need for stool cards vs screening colonoscopy and he will follow-up with insurance on benefits and coverage. Aware of risk  in not doing so i.e. cancer and death.  Complete Medication List: 1)  Amlodipine Besylate 5 Mg Tabs (Amlodipine besylate) .... One daily 2)  Lisinopril-hydrochlorothiazide 20-12.5 Mg Tabs (Lisinopril-hydrochlorothiazide) .... One daily 3)  Fish Oil 1000 Mg Caps (Omega-3 fatty acids) .... Once daily 4)  Flax Seed Oil 1000 Mg Caps (Flaxseed (linseed)) .... Once daily 5)  Aspir-low 81 Mg Tbec (Aspirin) .... One daily 6)  Veramyst 27.5 Mcg/spray Susp (Fluticasone furoate) .... Two squirts per nostril daily 7)  Loratadine 10 Mg Tabs (Loratadine) .... One daily  Other Orders: T-PSA  ([id number])  Lipid Assessment/Plan:      Based on NCEP/ATP III, the patient's risk factor category is "2 or more risk factors and a calculated 10 year CAD risk of < 20%".  From this information, the patient's calculated lipid goals are as follows: Total cholesterol goal is 200; LDL cholesterol goal is 130; HDL cholesterol goal is 40; Triglyceride goal is 150.     Patient Instructions: 1)  Please schedule a follow-up appointment in 1 month.   Prescriptions: LISINOPRIL-HYDROCHLOROTHIAZIDE 20-12.5 MG  TABS (LISINOPRIL-HYDROCHLOROTHIAZIDE) One daily  #30 x 1   Entered and Authorized by:   Franchot Heidelberg MD   Signed by:   Franchot Heidelberg MD on 04/16/2008   Method used:   Print then Give to Patient   RxID:    [id number] AMLODIPINE BESYLATE 5 MG  TABS (AMLODIPINE BESYLATE) One daily  #90 x 0   Entered and Authorized by:   Franchot Heidelberg MD   Signed by:   Franchot Heidelberg MD on 04/16/2008   Method used:   Print then Give to Patient   RxID:   [id number]

## 2010-05-09 NOTE — Letter (Signed)
Summary: TCS ORDER/TRIAGE  TCS ORDER/TRIAGE   Imported By: Rexene Alberts 02/16/2010 09:36:38  _____________________________________________________________________  External Attachment:    Type:   Image     Comment:   External Document

## 2010-05-09 NOTE — Progress Notes (Signed)
Summary: patient walk in  Phone Note Call from Patient   Reason for Call: Talk to Nurse Summary of Call: patient came in this morning demanding that he be seen as a work in. He said that he felt dizzy and said he knew it was from his sinuses and wanted to see the dr for it. He then said he needed to talk to the dr anyway. I then told him I could probably need to get him to come in at 1430 because if he needed to talk to dr Erby Pian about something else he needed to come back this afternoon. He wasn't very happy about this and I advised him next time he wanted to be worked in that he could call and speak to our triage nurse and get an appt so he wouldn' have to drive up here and drive back. I did tell him I was sorry and that we would see him at 1430. Initial call taken by: Donneta Romberg,  July 28, 2007 9:55 AM  Follow-up for Phone Call        Noted. Follow-up by: Franchot Heidelberg MD,  July 28, 2007 3:10 PM

## 2010-05-09 NOTE — Letter (Signed)
Summary: lab/xray Letter  lab/xray Letter   Imported By: Curtis Sites 06/24/2007 11:04:36  _____________________________________________________________________  External Attachment:    Type:   Image     Comment:   External Document

## 2010-05-09 NOTE — Miscellaneous (Signed)
Summary: colonoscopy  Clinical Lists Changes  Observations: Added new observation of COLONOSCOPY: refuses (03/14/2007 14:23)      Preventive Care Screening  Colonoscopy:    Date:  03/14/2007    Results:  refuses     06-23-07-pt still has not brought in stool cards.

## 2010-05-09 NOTE — Letter (Signed)
Summary: Internal Other Domingo Dimes  Internal Other Domingo Dimes   Imported By: Cloria Spring LPN 34/74/2595 63:87:56  _____________________________________________________________________  External Attachment:    Type:   Image     Comment:   External Document

## 2010-05-09 NOTE — Assessment & Plan Note (Signed)
Summary: FOLLOW UP 3 MONTH/SLJ   Vital Signs:  Patient profile:   60 year old male Height:      73 inches Weight:      263 pounds O2 Sat:      100 % Temp:     99.0 degrees F Pulse rate:   87 / minute Resp:     14 per minute BP sitting:   154 / 99  Vitals Entered By: Sherilyn Banker LPN (Aug 31, 2008 4:04 PM) CC: BP recheck , weight check, Lipid Management   Primary Gaven Eugene:  Franchot Heidelberg, MD  CC:  BP recheck , weight check, and Lipid Management.  History of Present Illness: Pt in for recheck.  He states he got shocking news today. States has insurance and not sur why they are not paying bills. He will call and check on this with them as not sure what is going on. States has had three or four chnages since he has been there.   He states he has been using BP pills more regularly. he misses this 3 or 4 times since last vist. He states he does not take medication same time each day. States thinks would do better if he used daily. He has lost 6 pounds since last visit with dietary chnages and exersize. He states he will do BP at home and levels here good - states 142/93.  States has been as low as 127.83. He denies chest pain, orthopnea, PND and states still mild leg swelling on amlodipine. States if he wears low cut socks legs do not swell. Knows related to amlodipine.  He is watching salt.   He has a family hx of colon cancer. Had neg heme occult set. He has good apetite. He denies nausea or vomitting. He denies diarrhea and constipation.He states has thought of colonoscopy and states will go on vacation week of July 4th and will do this after. However, will talk to insurance as ppears to not be paying bills.  He now presents.   Lipid Management History:      Positive NCEP/ATP III risk factors include male age 69 years old or older and hypertension.  Negative NCEP/ATP III risk factors include non-diabetic, no family history for ischemic heart disease, non-tobacco-user status, no  ASHD (atherosclerotic heart disease), no prior stroke/TIA, no peripheral vascular disease, and no history of aortic aneurysm.        The patient states that he knows about the "Therapeutic Lifestyle Change" diet.  His compliance with the TLC diet is fair.  The patient expresses understanding of adjunctive measures for cholesterol lowering.  Adjunctive measures started by the patient include aerobic exercise and fiber.  Comments: He states he is more active this summer. Eats tons of veggies and fruits. Has cut back on portions. Limiting fast food. He does walk some and lifts weight. States can do even better.   Preventive Screening-Counseling & Management     Smoking Status: never  Current Problems (verified): 1)  Special Screening For Malignant Neoplasms Colon  (ICD-V76.51) 2)  Morbid Obesity  (ICD-278.01) 3)  Leg Edema, Bilateral  (ICD-782.3) 4)  Allergic Rhinitis, Seasonal  (ICD-477.0) 5)  Hypertension  (ICD-401) 6)  Hernia, Umbilical  (ICD-553.1) 7)  Hyperglycemia  (ICD-790.6) 8)  Hyperlipidemia  (ICD-272.4)  Current Medications (verified): 1)  Amlodipine Besylate 5 Mg  Tabs (Amlodipine Besylate) .... One Daily 2)  Lisinopril-Hydrochlorothiazide 20-12.5 Mg  Tabs (Lisinopril-Hydrochlorothiazide) .... Two Times A Day 3)  Fish Oil 1000 Mg Caps (Omega-3  Fatty Acids) .... Once Daily 4)  Flax Seed Oil 1000 Mg Caps (Flaxseed (Linseed)) .... Once Daily 5)  Aspir-Low 81 Mg Tbec (Aspirin) .... One Daily 6)  Veramyst 27.5 Mcg/spray Susp (Fluticasone Furoate) .... Two Squirts Per Nostril Daily 7)  Loratadine 10 Mg  Tabs (Loratadine) .... One Daily  Allergies (verified): 1)  ! * Ivp Dye  Past History:  Past Medical History:    Current Problems:     MORBID OBESITY (ICD-278.01)    LEG EDEMA, BILATERAL (ICD-782.3)    EUSTACHIAN TUBE DYSFUNCTION (ICD-381.81)    ALLERGIC RHINITIS, SEASONAL (ICD-477.0)    HYPERTENSION (ICD-401)    HERNIA, UMBILICAL (ICD-553.1)    HYPERGLYCEMIA (ICD-790.6)     HYPERLIPIDEMIA (ICD-272.4)     (11/07/2007)  Family History:    Father: CAD; CVA; MI Dead at 70    Mother: CVA; DM Dead at 9    1 sister: Colon CA 8 - dx at age 14     (05/31/2008)  Social History:    Married    Never Smoked    Alcohol use-no    Drug use-no    Occupation: Best boy for [employer]     (07/29/2006)  Risk Factors:    Alcohol Use: N/A    >5 drinks/d w/in last 3 months: N/A    Caffeine Use: N/A    Diet: N/A    Exercise: N/A  Risk Factors:    Smoking Status: never (07/29/2006)    Packs/Day: N/A    Cigars/wk: N/A    Pipe Use/wk: N/A    Cans of tobacco/wk: N/A    Passive Smoke Exposure: N/A  Past Surgical History:    Denies surgical history    Heart cath - 2006 - normal coronaries  Family History:    Father: CAD; CVA; MI Dead at 60    Mother: CVA; DM Dead at 5    1 sister: Colon CA 35 - dx at age 21, now also with DM (dx at age 46)    Kids - Step-son - age 22 and healthy  Social History:    Reviewed history from 07/29/2006 and no changes required:       Married       Never Smoked       Alcohol use-no       Drug use-no       Occupation: Best boy for [employer]       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 Resp:  Denies cough, shortness of breath, sputum productive, and wheezing. GI:  See HPI. GU:  Denies nocturia, urinary frequency, and urinary hesitancy. Psych:  Denies anxiety and depression.  Physical Exam  General:  Well-developed,well-nourished,in no acute distress; alert,appropriate and cooperative throughout examination Lungs:  Normal respiratory effort, chest expands symmetrically. Lungs are clear to auscultation, no crackles or wheezes. Heart:  Normal rate and regular rhythm. S1 and S2 normal without gallop, murmur, click, rub or other extra sounds. Abdomen:  Bowel sounds positive,abdomen soft and  non-tender without masses, organomegaly with grape sized umbilical hernia at 12. No pain and present for years. Extremities:  No clubbing, cyanosis, edema, or deformity noted with normal full range of motion of all joints.   Cervical Nodes:  No lymphadenopathy noted Psych:  Cognition and judgment appear intact. Alert and cooperative with normal  attention span and concentration. No apparent delusions, illusions, hallucinations   Impression & Recommendations:  Problem # 1:  HYPERTENSION (ICD-401) Dicussed. pressure remians high. Refuses Rx chnages. Aware of risk. States will try even harder to take BP med daily. Advised diet, exersize and weight loss. Limit salt. Aware of need for eye and dental care. Recheck at end of visit - 150/92 . He will log BP once weekly for 3 months and will assess at that time.  His updated medication list for this problem includes:    Amlodipine Besylate 5 Mg Tabs (Amlodipine besylate) ..... One daily    Lisinopril-hydrochlorothiazide 20-12.5 Mg Tabs (Lisinopril-hydrochlorothiazide) .Marland Kitchen..Marland Kitchen Two times a day  Problem # 2:  HYPERLIPIDEMIA (ICD-272.4) TLC diet only. Recheck lab in 3 months.  Problem # 3:  MORBID OBESITY (ICD-278.01) Discussed. Happy with weight loss. Urged to stay course, increase exersize.  Problem # 4:  SPECIAL SCREENING FOR MALIGNANT NEOPLASMS COLON (ICD-V76.51) Advised again on colonoscopy. Aware of risk with sister and her hx. Neg stool cards times 3. States amy do after vacaton July 4. Recheck 3 months - sooner if needed. Agrees.  Complete Medication List: 1)  Amlodipine Besylate 5 Mg Tabs (Amlodipine besylate) .... One daily 2)  Lisinopril-hydrochlorothiazide 20-12.5 Mg Tabs (Lisinopril-hydrochlorothiazide) .... Two times a day 3)  Fish Oil 1000 Mg Caps (Omega-3 fatty acids) .... Once daily 4)  Flax Seed Oil 1000 Mg Caps (Flaxseed (linseed)) .... Once daily 5)  Aspir-low 81 Mg Tbec (Aspirin) .... One daily 6)  Veramyst 27.5 Mcg/spray Susp  (Fluticasone furoate) .... Two squirts per nostril daily 7)  Loratadine 10 Mg Tabs (Loratadine) .... One daily  Lipid Assessment/Plan:      Based on NCEP/ATP III, the patient's risk factor category is "2 or more risk factors and a calculated 10 year CAD risk of < 20%".  From this information, the patient's calculated lipid goals are as follows: Total cholesterol goal is 200; LDL cholesterol goal is 130; HDL cholesterol goal is 40; Triglyceride goal is 150.    Patient Instructions: 1)  Please schedule a follow-up appointment in 3 months. Prescriptions: LORATADINE 10 MG  TABS (LORATADINE) One daily  #30 x 3   Entered and Authorized by:   Franchot Heidelberg MD   Signed by:   Franchot Heidelberg MD on 08/31/2008   Method used:   Electronically to        [facility] [phone]* (retail)       [address]       [place], [place]  [address]       Ph: [phone] or [phone]       Fax: [phone]   RxID:   [id number] VERAMYST 27.5 MCG/SPRAY SUSP (FLUTICASONE FUROATE) Two squirts per nostril daily  #1 x 3   Entered and Authorized by:   Franchot Heidelberg MD   Signed by:   Franchot Heidelberg MD on 08/31/2008   Method used:   Electronically to        [facility] [phone]* (retail)       [address]       [place], [place]  [address]       Ph: [phone] or [phone]       Fax: [phone]   RxID:   [id number] LISINOPRIL-HYDROCHLOROTHIAZIDE 20-12.5 MG  TABS (LISINOPRIL-HYDROCHLOROTHIAZIDE) two times a day  #60 x 3   Entered and Authorized by:   Franchot Heidelberg MD  Signed by:   Franchot Heidelberg MD on 08/31/2008   Method used:   Electronically to        [facility] [phone]* (retail)       [address]       [place], [place]  [address]       Ph: [phone] or [phone]       Fax: [phone]   RxID:   [id number] AMLODIPINE BESYLATE 5 MG  TABS (AMLODIPINE BESYLATE) One daily  #90 x 1   Entered and  Authorized by:   Franchot Heidelberg MD   Signed by:   Franchot Heidelberg MD on 08/31/2008   Method used:   Electronically to        [facility] [phone]* (retail)       [address]       [place], [place]  [address]       Ph: [phone] or [phone]       Fax: [phone]   RxID:   [id number]     Prior Medications Reviewed Using: Patient Recall  Current Allergies (reviewed today): ! * IVP DYE

## 2010-05-09 NOTE — Progress Notes (Signed)
Summary: pt cx TCS for 01/23/2010  Phone Note Call from Patient   Caller: Patient Summary of Call: Pt came by office and cancelled appt for TCS on 01/23/2010. Wants to schedule in Nov. when that schedule is available. Prefers a WED. Informed [place] in Endo. Cancelled in IDX. Initial call taken by: Cloria Spring LPN,  January 09, 2010 10:16 AM     Appended Document: pt cx TCS for 01/23/2010 [facility] to call and schedule TCS. Mailing letter also to call.

## 2010-05-09 NOTE — Assessment & Plan Note (Signed)
Summary: 3 month follow up/ dms   Vital Signs:  Patient Profile:   60 Years Old Male Height:     73 inches Weight:      261 pounds BMI:     34.56 O2 Sat:      99 % Temp:     97.1 degrees F Pulse rate:   90 / minute Resp:     16 per minute BP sitting:   146 / 88  Vitals Entered By: Sherilyn Banker (June 20, 2007 4:20 PM)                 PCP:  Franchot Heidelberg, MD  Chief Complaint:  follow up visit.  History of Present Illness: Pt in for recheck.  He notes he is doing well. He has not brought stool cards back. States has been busy and will bring back. He denies apetite change, abdominal pain, weight loss and has not had nausea, vomitting, diarrhea or constipation. No bloody stool. Declines colonoscopy.  He now presents.   Hypertension History:      He denies headache, chest pain, palpitations, dyspnea with exertion, orthopnea, PND, peripheral edema, visual symptoms, neurologic problems, syncope, and side effects from treatment.  He notes no problems with any antihypertensive medication side effects.  Further comments include: Still missing some doses on his BP meds. Has missed about 4 doses in 3 weeks. He is not open to dose increase and states he wants to work on weight loss and diet. He is walking and working with wegjhts now. 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 Does two to three miles on tape. Marland Kitchen        Positive major cardiovascular risk factors include male age 24 years old or older, hyperlipidemia, and hypertension.  Negative major cardiovascular risk factors include no history of diabetes, negative family history for ischemic heart disease, and non-tobacco-user status.        Further assessment for target organ damage reveals no history of ASHD, stroke/TIA, or peripheral vascular disease.    Lipid Management History:      Positive NCEP/ATP III risk factors include male age 42 years old or older and hypertension.  Negative NCEP/ATP III risk  factors include non-diabetic, no family history for ischemic heart disease, non-tobacco-user status, no ASHD (atherosclerotic heart disease), no prior stroke/TIA, no peripheral vascular disease, and no history of aortic aneurysm.        The patient states that he knows about the "Therapeutic Lifestyle Change" diet.  His compliance with the TLC diet is fair.  The patient expresses understanding of adjunctive measures for cholesterol lowering.  Adjunctive measures started by the patient include aerobic exercise, fiber, ASA, and omega-3 supplements.  Comments: Not taking aspirin daily as he should. States forgets.      Updated Prior Medication List: NORVASC 5 MG TABS (AMLODIPINE BESYLATE) once daily LISINOPRIL-HYDROCHLOROTHIAZIDE 20-25 MG TABS (LISINOPRIL-HYDROCHLOROTHIAZIDE) once daily FISH OIL 1000 MG CAPS (OMEGA-3 FATTY ACIDS) once daily FLAX SEED OIL 1000 MG CAPS (FLAXSEED (LINSEED)) once daily ASPIR-LOW 81 MG TBEC (ASPIRIN) One daily  Current Allergies (reviewed today): ! * IVP DYE  Past Medical History:    Reviewed history from 07/29/2006 and no changes required:       HYPERTENSION (ICD-401)       HERNIA, UMBILICAL (ICD-553.1)       HYPERGLYCEMIA (ICD-790.6)       HYPERLIPIDEMIA (ICD-272.4)         Past Surgical History:  Reviewed history from 07/29/2006 and no changes required:       Denies surgical history   Family History:    Reviewed history from 07/29/2006 and no changes required:       Father: CAD; CVA; MI Dead at 8       Mother: CVA; DM Dead at 42       1 sister: Colon CA 77  Social History:    Reviewed history from 07/29/2006 and no changes required:       Married       Never Smoked       Alcohol use-no       Drug use-no       Occupation: Best boy for [employer]   Risk Factors: Tobacco use:  never Drug use:  no Alcohol use:  no  Family History Risk Factors:    Family History of MI in females < 61 years old:  no    Family History of MI in males  < 62 years old:  no   Review of Systems      See HPI  General      Denies fatigue and malaise.  Resp      Denies cough, shortness of breath, sputum productive, and wheezing.  GI      See HPI  GU      Denies nocturia, urinary frequency, and urinary hesitancy.   Physical Exam  General:     Well-developed,well-nourished,in no acute distress; alert,appropriate and cooperative throughout examination Lungs:     Normal respiratory effort, chest expands symmetrically. Lungs are clear to auscultation, no crackles or wheezes. Heart:     Normal rate and regular rhythm. S1 and S2 normal without gallop, murmur, click, rub or other extra sounds. Abdomen:     Bowel sounds positive,abdomen soft and non-tender without masses, organomegaly. Small umbilical hernia noted. No strangulation. Extremities:     No clubbing, cyanosis, edema, or deformity noted with normal full range of motion of all joints.   Cervical Nodes:     No lymphadenopathy noted Psych:     Cognition and judgment appear intact. Alert and cooperative with normal attention span and concentration. No apparent delusions, illusions, hallucinations    Impression & Recommendations:  Problem # 1:  HYPERTENSION (ICD-401) BO too high.Advised med incrase. Declines. States will do diet, exersize. Recheck 3 months. Aware of CVA and MI risk. His updated medication list for this problem includes:    Norvasc 5 Mg Tabs (Amlodipine besylate) ..... Once daily    Lisinopril-hydrochlorothiazide 20-25 Mg Tabs (Lisinopril-hydrochlorothiazide) ..... Once daily  Orders: T-Basic Metabolic Panel [phone]) Venipuncture ([id number])   Problem # 2:  HYPERLIPIDEMIA (ICD-272.4) Cont TLC.   Problem # 3:  HYPERGLYCEMIA (ICD-790.6) Weight loss and healthy diet encouraged. await BMP to check renal fucntion and lytes with HCTZ use.  Problem # 4:  Preventive Health Care (ICD-V70.0) Needs stool guiac. Reasusres me again will do. Declines colonoscopy.  Advised stool cards and he will bring this in as soon as he can. Aware of colon cancer and death risk  Complete Medication List: 1)  Norvasc 5 Mg Tabs (Amlodipine besylate) .... Once daily 2)  Lisinopril-hydrochlorothiazide 20-25 Mg Tabs (Lisinopril-hydrochlorothiazide) .... Once daily 3)  Fish Oil 1000 Mg Caps (Omega-3 fatty acids) .... Once daily 4)  Flax Seed Oil 1000 Mg Caps (Flaxseed (linseed)) .... Once daily 5)  Aspir-low 81 Mg Tbec (Aspirin) .... One daily  Hypertension Assessment/Plan:      The patient's hypertensive risk group  is category B: At least one risk factor (excluding diabetes) with no target organ damage.  His calculated 10 year risk of coronary heart disease is 14 %.  Today's blood pressure is 146/88.  His blood pressure goal is < 140/90.  Lipid Assessment/Plan:      Based on NCEP/ATP III, the patient's risk factor category is "2 or more risk factors and a calculated 10 year CAD risk of < 20%".  From this information, the patient's calculated lipid goals are as follows: Total cholesterol goal is 200; LDL cholesterol goal is 130; HDL cholesterol goal is 40; Triglyceride goal is 150.      Patient Instructions: 1)  Please schedule a follow-up appointment in 3 months.    Prescriptions: LISINOPRIL-HYDROCHLOROTHIAZIDE 20-25 MG TABS (LISINOPRIL-HYDROCHLOROTHIAZIDE) once daily  #30 x 3   Entered and Authorized by:   Franchot Heidelberg MD   Signed by:   Franchot Heidelberg MD on 06/20/2007   Method used:   Print then Give to Patient   RxID:   [id number] NORVASC 5 MG TABS (AMLODIPINE BESYLATE) once daily  #30 x 3   Entered and Authorized by:   Franchot Heidelberg MD   Signed by:   Franchot Heidelberg MD on 06/20/2007   Method used:   Print then Give to Patient   RxID:   [id number]  ]  Preventive Care Screening  Last Flu Shot:    Date:  06/20/2007    Results:  Refused

## 2010-05-09 NOTE — Progress Notes (Signed)
Summary: BP update  Phone Note Call from Patient   Caller: Patient Summary of Call: Patient called and said his BP (144/103)was still up and wanted to be seen today. No appointments avail for today, told I can put him in on Tuesday, but he insists appointment today. Also said his ears are no better. Advise Initial call taken by: Sonny Dandy,  August 04, 2007 8:46 AM  Follow-up for Phone Call        Appt at 4 pm. Follow-up by: Franchot Heidelberg MD,  August 04, 2007 8:52 AM  Additional Follow-up for Phone Call Additional follow up Details #1::        Appointment made and patient called by Adelene Amas. Additional Follow-up by: Sonny Dandy,  August 04, 2007 9:39 AM

## 2010-05-09 NOTE — Assessment & Plan Note (Signed)
Summary: 6 WEEK FOLLOW UP/ARC   Vital Signs:  Patient Profile:   60 Years Old Male Height:     73 inches Weight:      216 pounds BMI:     28.60 O2 Sat:      99 % Temp:     98.0 degrees F Pulse rate:   89 / minute Resp:     14 per minute BP sitting:   151 / 99  Vitals Entered By: Sherilyn Banker (November 08, 2006 3:51 PM)               PCP:  Franchot Heidelberg, MD  Chief Complaint:  follow up visit/ needs refills on Norvasc and Lisinopril.  History of Present Illness: Pt in for recheck.  He notes he was running late today. He never got his labs done since last visit and he will go get this done. He states he just got back from vacation and he has just been too busy at work.  He again notes he is not ready for rectal, PSA and colonoscopy. He wants to wait until we get his blood results before we refer him.  He now presents.  Hypertension History:      He denies headache, chest pain, palpitations, dyspnea with exertion, orthopnea, PND, peripheral edema, visual symptoms, neurologic problems, syncope, and side effects from treatment.  He notes no problems with any antihypertensive medication side effects.  Further comments include: He is watching salt intake. He is not really exersizing. He was rushed to get in today.        Positive major cardiovascular risk factors include male age 74 years old or older, hyperlipidemia, and hypertension.  Negative major cardiovascular risk factors include no history of diabetes, negative family history for ischemic heart disease, and non-tobacco-user status.        Further assessment for target organ damage reveals no history of ASHD, stroke/TIA, or peripheral vascular disease.     Current Allergies (reviewed today): ! * IVP DYE  Past Medical History:    Reviewed history from 07/29/2006 and no changes required:       Hyperlipidemia       Hypertension  Past Surgical History:    Reviewed history from 07/29/2006 and no changes required:  Denies surgical history   Family History:    Reviewed history from 07/29/2006 and no changes required:       Father: CAD; CVA; MI Dead at 76       Mother: CVA; DM Dead at 56       1 sister: Colon CA 81  Social History:    Reviewed history from 07/29/2006 and no changes required:       Married       Never Smoked       Alcohol use-no       Drug use-no       Occupation: Best boy for [employer]    Review of Systems      See HPI   Physical Exam  General:     Well-developed,well-nourished,in no acute distress; alert,appropriate and cooperative throughout examination Lungs:     Normal respiratory effort, chest expands symmetrically. Lungs are clear to auscultation, no crackles or wheezes. Heart:     Normal rate and regular rhythm. S1 and S2 normal without gallop, murmur, click, rub or other extra sounds. Abdomen:     Bowel sounds positive,abdomen soft and non-tender without masses, organomegaly. Small umbilical hernia noted. No strangulation. Extremities:  No clubbing, cyanosis, edema, or deformity noted with normal full range of motion of all joints.      Impression & Recommendations:  Problem # 1:  HYPERTENSION (ICD-401) High. Anxious as he did not follow through on labs and was in rush to get here. Recheck four weeks. Refill meds. Await labs. His updated medication list for this problem includes:    Norvasc 5 Mg Tabs (Amlodipine besylate) ..... Once daily    Lisinopril-hydrochlorothiazide 20-25 Mg Tabs (Lisinopril-hydrochlorothiazide) ..... Once daily   Problem # 2:  HYPERLIPIDEMIA (ICD-272.4) Await labs to optomize per ATP III. Councelled diet, exersize and low salt intake.  Problem # 3:  WELL ADULT EXAM (ICD-V70.0) Await labs and optomize.Aware of USPTF guidelines. Appears to be trying to put off labs and advised I cannot cont to see himand write his meds if he does not do his part. He agrees and will return in 4 weeks with labs prior to visit.   Complete  Medication List: 1)  Norvasc 5 Mg Tabs (Amlodipine besylate) .... Once daily 2)  Lisinopril-hydrochlorothiazide 20-25 Mg Tabs (Lisinopril-hydrochlorothiazide) .... Once daily 3)  Fish Oil 1000 Mg Caps (Omega-3 fatty acids) .... Once daily 4)  Flax Seed Oil 1000 Mg Caps (Flaxseed (linseed)) .... Once daily 5)  Aspir-low 81 Mg Tbec (Aspirin) .... One daily  Hypertension Assessment/Plan:      The patient's hypertensive risk group is category B: At least one risk factor (excluding diabetes) with no target organ damage.  Today's blood pressure is 151/99.  His blood pressure goal is < 140/90.   Patient Instructions: 1)  Please schedule a follow-up appointment in 1 month.    Prescriptions: LISINOPRIL-HYDROCHLOROTHIAZIDE 20-25 MG TABS (LISINOPRIL-HYDROCHLOROTHIAZIDE) once daily  #30 x 1   Entered and Authorized by:   Franchot Heidelberg MD   Signed by:   Franchot Heidelberg MD on 11/08/2006   Method used:   Print then Give to Patient   RxID:   [id number] NORVASC 5 MG TABS (AMLODIPINE BESYLATE) once daily  #30 x 1   Entered and Authorized by:   Franchot Heidelberg MD   Signed by:   Franchot Heidelberg MD on 11/08/2006   Method used:   Print then Give to Patient   RxID:   [id number]

## 2010-05-09 NOTE — Letter (Signed)
Summary: Historic registration  Historic registration   Imported By: Curtis Sites 01/31/2009 13:26:24  _____________________________________________________________________  External Attachment:    Type:   Image     Comment:   External Document

## 2010-05-09 NOTE — Assessment & Plan Note (Signed)
Summary: follow up 1 month/slj   Vital Signs:  Patient Profile:   60 Years Old Male Height:     73 inches Weight:      262 pounds BMI:     34.69 O2 Sat:      98 % Temp:     97.1 degrees F Pulse rate:   99 / minute Resp:     14 per minute BP sitting:   134 / 89  Vitals Entered By: Sherilyn Banker (Aug 25, 2007 4:07 PM)                 PCP:  Franchot Heidelberg, MD  Chief Complaint:  follow up visit.  History of Present Illness: Pt in for recheck.  He has a hx of vertigo sx. He used the antivert and states he only took a few doses. He states he took twice and notes he did not know if he needed to continue. He states when he used this dizzyness was gone. He states still mild diziness at times. Notes when wind blows and hits ear sx come back. He feels unsteady. He denies headaches and runny nose. He has no sore throat and notes occasional itch. He denies sneezing. Minimal cough. States some mucous - mostly white. Much better than before.  He adds sx worse in air conditioning as well. States feels like somthing crawling in head. He denies blackouts and syncope. No falls or injuries noted. He cont Nasonex for allergies and has not used for 3 to 4 days. States when he uses this he does choke a little. He notes when he coughs a lot he feels like blacking out. Normal head CT Dec 2009.   He now presents.   Hypertension History:      He complains of peripheral edema, but denies headache, chest pain, palpitations, dyspnea with exertion, orthopnea, PND, visual symptoms, neurologic problems, syncope, and side effects from treatment.  He notes no problems with any antihypertensive medication side effects.  Further comments include: POn higher dose Norvasc. Now c omplaining of leg swelling. States only since gone to higehr dose. Reports leg numbness and tingling with this. Worse at end of day or wtih long periods of standing. Watching salt intake.        Positive major cardiovascular risk factors  include male age 29 years old or older, hyperlipidemia, and hypertension.  Negative major cardiovascular risk factors include no history of diabetes, negative family history for ischemic heart disease, and non-tobacco-user status.        Further assessment for target organ damage reveals no history of ASHD, stroke/TIA, or peripheral vascular disease.    Lipid Management History:      Positive NCEP/ATP III risk factors include male age 44 years old or older and hypertension.  Negative NCEP/ATP III risk factors include non-diabetic, no family history for ischemic heart disease, non-tobacco-user status, no ASHD (atherosclerotic heart disease), no prior stroke/TIA, no peripheral vascular disease, and no history of aortic aneurysm.        The patient states that he knows about the "Therapeutic Lifestyle Change" diet.  His compliance with the TLC diet is fair.  The patient expresses understanding of adjunctive measures for cholesterol lowering.  Adjunctive measures started by the patient include aerobic exercise, fiber, ASA, and omega-3 supplements.        Prior Medications Reviewed Using: Patient Recall  Updated Prior Medication List: AMLODIPINE BESYLATE 5 MG  TABS (AMLODIPINE BESYLATE) One daily LISINOPRIL-HYDROCHLOROTHIAZIDE 20-12.5 MG  TABS (LISINOPRIL-HYDROCHLOROTHIAZIDE) One  daily FISH OIL 1000 MG CAPS (OMEGA-3 FATTY ACIDS) once daily FLAX SEED OIL 1000 MG CAPS (FLAXSEED (LINSEED)) once daily ASPIR-LOW 81 MG TBEC (ASPIRIN) One daily MECLIZINE HCL 25 MG  TABS (MECLIZINE HCL) One every 6 hours as needed NASONEX 50 MCG/ACT  SUSP (MOMETASONE FUROATE) 2 squirts per nostril once daily LORATADINE 10 MG  TABS (LORATADINE) One daily  Current Allergies (reviewed today): ! * IVP DYE  Past Medical History:    Reviewed history from 06/20/2007 and no changes required:       HYPERTENSION (ICD-401)       HERNIA, UMBILICAL (ICD-553.1)       HYPERGLYCEMIA (ICD-790.6)       HYPERLIPIDEMIA (ICD-272.4)          Past Surgical History:    Reviewed history from 07/29/2006 and no changes required:       Denies surgical history   Family History:    Reviewed history from 07/29/2006 and no changes required:       Father: CAD; CVA; MI Dead at 52       Mother: CVA; DM Dead at 73       1 sister: Colon CA 60  Social History:    Reviewed history from 07/29/2006 and no changes required:       Married       Never Smoked       Alcohol use-no       Drug use-no       Occupation: Best boy for [employer]   Risk Factors: Tobacco use:  never Drug use:  no Alcohol use:  no  Family History Risk Factors:    Family History of MI in females < 48 years old:  no    Family History of MI in males < 49 years old:  no   Review of Systems      See HPI   Physical Exam  General:     Well-developed,well-nourished,in no acute distress; alert,appropriate and cooperative throughout examination Head:     Normocephalic and atraumatic without obvious abnormalities.  Eyes:     No corneal or conjunctival inflammation noted. EOMI. Perrla. Eyes not injected Ears:     External ear exam shows no significant lesions or deformities.  Otoscopic examination reveals clear canals, tympanic membranes are intact bilaterally without bulging, retraction, inflammation or discharge. Hearing is grossly normal bilaterally. Nose:     Mild congestion with large turbinates. Mouth:     Oral mucosa and oropharynx without lesions or exudates.  Teeth in good repair. Neck:     No deformities, masses, or tenderness noted. Lungs:     Normal respiratory effort, chest expands symmetrically. Lungs are clear to auscultation, no crackles or wheezes. Heart:     Normal rate and regular rhythm. S1 and S2 normal without gallop, murmur, click, rub or other extra sounds. Abdomen:     Bowel sounds positive,abdomen soft and non-tender without masses, organomegaly or hernias noted. Extremities:     Trace edema.    Impression &  Recommendations:  Problem # 1:  ALLERGIC RHINITIS, SEASONAL (ICD-477.0) Discussed. Single dose Decdron/Depomedrol today for residual sx. Advised need for daily use of Calritin, Nasonex. He has not done this and was edcuated on allergic response and histamine cascade. Suspect inner ear sx related to latter and will try Antivert two times a day for the next 5 to 7 days. If sx persist, refer ENT. Agrees. Call if sx worsen. Agrees. Orders: Decadron 1mg  (H8469) Depo- Medrol 80mg  (J1040)  Admin of Therapeutic Inj  intramuscular or subcutaneous ([address])   Problem # 2:  HYPERTENSION (ICD-401) BP improved but did not tolerate Norvasc based on leg swelling. Decrease to 5mg  dose as he did not have sx with this. Incerease ACE and use two times a day. Advised risk and beenfit. and gave new script. Councelled diet, exersize and low salt intake. Recheck 4 weeks with renal function check. His updated medication list for this problem includes:    Amlodipine Besylate 5 Mg Tabs (Amlodipine besylate) ..... One daily    Lisinopril-hydrochlorothiazide 20-12.5 Mg Tabs (Lisinopril-hydrochlorothiazide) ..... One daily   Problem # 3:  LEG EDEMA, BILATERAL (ICD-782.3) See above. reassured. Should resolve with decreased Amlodipine dose. His updated medication list for this problem includes:    Lisinopril-hydrochlorothiazide 20-12.5 Mg Tabs (Lisinopril-hydrochlorothiazide) ..... One daily   Complete Medication List: 1)  Amlodipine Besylate 5 Mg Tabs (Amlodipine besylate) .... One daily 2)  Lisinopril-hydrochlorothiazide 20-12.5 Mg Tabs (Lisinopril-hydrochlorothiazide) .... One daily 3)  Fish Oil 1000 Mg Caps (Omega-3 fatty acids) .... Once daily 4)  Flax Seed Oil 1000 Mg Caps (Flaxseed (linseed)) .... Once daily 5)  Aspir-low 81 Mg Tbec (Aspirin) .... One daily 6)  Meclizine Hcl 25 Mg Tabs (Meclizine hcl) .... One every 6 hours as needed 7)  Nasonex 50 Mcg/act Susp (Mometasone furoate) .... 2 squirts per nostril once  daily 8)  Loratadine 10 Mg Tabs (Loratadine) .... One daily  Hypertension Assessment/Plan:      The patient's hypertensive risk group is category B: At least one risk factor (excluding diabetes) with no target organ damage.  His calculated 10 year risk of coronary heart disease is 11 %.  Today's blood pressure is 134/89.  His blood pressure goal is < 140/90.  Lipid Assessment/Plan:      Based on NCEP/ATP III, the patient's risk factor category is "2 or more risk factors and a calculated 10 year CAD risk of < 20%".  From this information, the patient's calculated lipid goals are as follows: Total cholesterol goal is 200; LDL cholesterol goal is 130; HDL cholesterol goal is 40; Triglyceride goal is 150.     Patient Instructions: 1)  Please schedule a follow-up appointment in 1 month.   Prescriptions: LISINOPRIL-HYDROCHLOROTHIAZIDE 20-12.5 MG  TABS (LISINOPRIL-HYDROCHLOROTHIAZIDE) One daily  #60 x 3   Entered and Authorized by:   Franchot Heidelberg MD   Signed by:   Franchot Heidelberg MD on 08/25/2007   Method used:   Print then Give to Patient   RxID:   [id number] AMLODIPINE BESYLATE 5 MG  TABS (AMLODIPINE BESYLATE) One daily  #90 x 3   Entered and Authorized by:   Franchot Heidelberg MD   Signed by:   Franchot Heidelberg MD on 08/25/2007   Method used:   Print then Give to Patient   RxID:   [id number] LORATADINE 10 MG  TABS (LORATADINE) One daily  #30 x 3   Entered and Authorized by:   Franchot Heidelberg MD   Signed by:   Franchot Heidelberg MD on 08/25/2007   Method used:   Print then Give to Patient   RxID:   [id number]  ]  Medication Administration  Injection # 1:    Medication: Decadron 1mg     Diagnosis: ALLERGIC RHINITIS, SEASONAL (ICD-477.0)    Route: IM    Site: L thigh    Exp Date: 02/07/2009    Lot #: [id number]    Mfr: [employer]    Patient tolerated injection without complications  Given by: Sherilyn Banker (Aug 25, 2007 5:03 PM)  Injection # 2:     Medication: Depo- Medrol 80mg     Diagnosis: ALLERGIC RHINITIS, SEASONAL (ICD-477.0)    Route: IM    Site: L thigh    Exp Date: 11/07/2009    Lot #: [id number]    Mfr: [employer]    Patient tolerated injection without complications    Given by: Sherilyn Banker (Aug 25, 2007 5:03 PM)  Orders Added: 1)  Decadron 1mg  [J1094] 2)  Depo- Medrol 80mg  [J1040] 3)  Admin of Therapeutic Inj  intramuscular or subcutaneous [96372] 4)  Est. Patient Level IV [96295]

## 2010-05-09 NOTE — Assessment & Plan Note (Signed)
Summary: 4 WEEK FOLLOW UP/ARC   Vital Signs:  Patient Profile:   60 Years Old Male Height:     73 inches Weight:      216 pounds O2 Sat:      99 % Pulse rate:   87 / minute Resp:     14 per minute BP sitting:   138 / 88  Vitals Entered By: Sherilyn Banker (December 13, 2006 4:20 PM)                 PCP:  Franchot Heidelberg, MD  Chief Complaint:  follow up visit.  History of Present Illness: Pt in for recheck.  He had recent labs. This is reviewed:  1. CBC - normal 2. CMP - glucose 106 3. Lipids - see below 4. TSH - normal 5. PSA - normal  He states he has not had any dysuria. No burning or stinging. States he never urinates after going to bed. No hx of porstate cancer and he denies family hx of prostate cancer - not sure about an uncle but adds he was in his 84s.  His apetite is good. he denies nause, vomitting, diarrhea and constipation. No bloody stools. Not open to colonoscopy at present - states cost is concern and insurance told him they don't pay much for this. Would be agreebale with heme-occults. Aware of colonc cancer risk.  Now presents.  Hypertension History:      He denies headache, chest pain, palpitations, dyspnea with exertion, orthopnea, PND, peripheral edema, visual symptoms, neurologic problems, syncope, and side effects from treatment.  He notes no problems with any antihypertensive medication side effects.  Further comments include: Taking pills. Home readings doing weel - 120s over 80. Watching salt. Exersizing little but will increase.        Positive major cardiovascular risk factors include male age 56 years old or older, hyperlipidemia, and hypertension.  Negative major cardiovascular risk factors include no history of diabetes, negative family history for ischemic heart disease, and non-tobacco-user status.        Further assessment for target organ damage reveals no history of ASHD, stroke/TIA, or peripheral vascular disease.    Lipid Management  History:      Positive NCEP/ATP III risk factors include male age 82 years old or older and hypertension.  Negative NCEP/ATP III risk factors include non-diabetic, no family history for ischemic heart disease, non-tobacco-user status, no ASHD (atherosclerotic heart disease), no prior stroke/TIA, no peripheral vascular disease, and no history of aortic aneurysm.        The patient states that he knows about the "Therapeutic Lifestyle Change" diet.  His compliance with the TLC diet is fair.  The patient expresses understanding of adjunctive measures for cholesterol lowering.  Adjunctive measures started by the patient include aerobic exercise, fiber, ASA, and omega-3 supplements.      Current Allergies (reviewed today): ! * IVP DYE  Past Medical History:    Reviewed history from 07/29/2006 and no changes required:       Hyperlipidemia       Hypertension  Past Surgical History:    Reviewed history from 07/29/2006 and no changes required:       Denies surgical history   Family History:    Reviewed history from 07/29/2006 and no changes required:       Father: CAD; CVA; MI Dead at 74       Mother: CVA; DM Dead at 44       1 sister:  Colon CA 50  Social History:    Reviewed history from 07/29/2006 and no changes required:       Married       Never Smoked       Alcohol use-no       Drug use-no       Occupation: Best boy for [employer]    Review of Systems      See HPI  General      Denies fatigue and malaise.  Resp      Denies cough, shortness of breath, sputum productive, and wheezing.  GI      Denies abdominal pain, constipation, diarrhea, nausea, and vomiting.  GU      Denies decreased libido, nocturia, urinary frequency, and urinary hesitancy.   Physical Exam  General:     Well-developed,well-nourished,in no acute distress; alert,appropriate and cooperative throughout examination Lungs:     Normal respiratory effort, chest expands symmetrically. Lungs are  clear to auscultation, no crackles or wheezes. Heart:     Normal rate and regular rhythm. S1 and S2 normal without gallop, murmur, click, rub or other extra sounds. Abdomen:     Bowel sounds positive,abdomen soft and non-tender without masses, organomegaly. Small umbilical hernia noted. No strangulation. Rectal:     No external abnormalities noted. Normal sphincter tone. No rectal masses or tenderness. Prostate:     Prostate gland firm and smooth, no enlargement, nodularity, tenderness, mass, asymmetry or induration. Extremities:     No clubbing, cyanosis, edema, or deformity noted with normal full range of motion of all joints.   Cervical Nodes:     No lymphadenopathy noted Psych:     Cognition and judgment appear intact. Alert and cooperative with normal attention span and concentration. No apparent delusions, illusions, hallucinations    Impression & Recommendations:  Problem # 1:  WELL ADULT EXAM (ICD-V70.0) Discussed. Advised diet, exersize and low salt intake. Discussed dental care and eye exams. We reviewed USPTF guidelines and hemeoccult was done and found to be negative with PSA and rectal normal. He does not want colonscopy as he feels this is to expensive. He states insurance will not cover this well either. We discussed heme-occults times three as next option and he agrees with this. Instructed on obtaining this and he will drop specimens off for review.   Orders: Hemoccult Guaiac-1 spec.(in office) ([address])   Problem # 2:  HYPERTENSION (ICD-401) Stable. Meds as is.  His updated medication list for this problem includes:    Norvasc 5 Mg Tabs (Amlodipine besylate) ..... Once daily    Lisinopril-hydrochlorothiazide 20-25 Mg Tabs (Lisinopril-hydrochlorothiazide) ..... Once daily   Problem # 3:  HYPERLIPIDEMIA (ICD-272.4) Diascussed. TLC all that is needed.  Problem # 4:  HYPERGLYCEMIA (ICD-790.6) Mild elevation. Advised diet, weight loss. Monitor periodically. Does place  at increased risk for development of DM. Follow.  Complete Medication List: 1)  Norvasc 5 Mg Tabs (Amlodipine besylate) .... Once daily 2)  Lisinopril-hydrochlorothiazide 20-25 Mg Tabs (Lisinopril-hydrochlorothiazide) .... Once daily 3)  Fish Oil 1000 Mg Caps (Omega-3 fatty acids) .... Once daily 4)  Flax Seed Oil 1000 Mg Caps (Flaxseed (linseed)) .... Once daily 5)  Aspir-low 81 Mg Tbec (Aspirin) .... One daily  Hypertension Assessment/Plan:      The patient's hypertensive risk group is category B: At least one risk factor (excluding diabetes) with no target organ damage.  His calculated 10 year risk of coronary heart disease is 9 %.  Today's blood pressure is 138/88.  His blood pressure goal is < 140/90.  Lipid Assessment/Plan:      Based on NCEP/ATP III, the patient's risk factor category is "2 or more risk factors and a calculated 10 year CAD risk of < 20%".  From this information, the patient's calculated lipid goals are as follows: Total cholesterol goal is 200; LDL cholesterol goal is 130; HDL cholesterol goal is 40; Triglyceride goal is 150.     Patient Instructions: 1)  Please schedule a follow-up appointment in 3 months. Drop heme-occults off on as soon as collected.    Prescriptions: LISINOPRIL-HYDROCHLOROTHIAZIDE 20-25 MG TABS (LISINOPRIL-HYDROCHLOROTHIAZIDE) once daily  #30 x 3   Entered and Authorized by:   Franchot Heidelberg MD   Signed by:   Franchot Heidelberg MD on 12/13/2006   Method used:   Print then Give to Patient   RxID:   [id number] NORVASC 5 MG TABS (AMLODIPINE BESYLATE) once daily  #30 x 3   Entered and Authorized by:   Franchot Heidelberg MD   Signed by:   Franchot Heidelberg MD on 12/13/2006   Method used:   Print then Give to Patient   RxID:   [id number]   Laboratory Results    Stool - Occult Blood Hemmoccult #1: negative Date: 12/13/2006  Kit Test Internal QC: Positive   (Normal Range: Negative)

## 2010-05-09 NOTE — Letter (Signed)
Summary: Historic Patient File  Historic Patient File   Imported By: Lind Guest 02/28/2010 11:16:26  _____________________________________________________________________  External Attachment:    Type:   Image     Comment:   External Document

## 2010-05-09 NOTE — Letter (Signed)
Summary: [facility]  [facility]  [address]   [place], [place] [address]   Phone: [phone]  Fax: [phone]    02/03/2010  THOMSON HERBERS [address] [place], [place]  [address] [birth year]  Dear Mr. Ruedas,   Your physician has indicated that:   Please call our office to schedule your appointment for your colonoscopy.     Please call our office at  [phone].    Thank you,    Cloria Spring LPN  [facility] Ph: [phone]   Fax: [phone]

## 2010-05-09 NOTE — Miscellaneous (Signed)
Summary: Occult Blood  Clinical Lists Changes  Observations: Added new observation of HEMOCCU 3 DT: 06/07/2008 (06/07/2008 16:56) Added new observation of HEMOCCU 2 DT: 06/07/2008 (06/07/2008 16:56) Added new observation of HEMOCCU 1 DT: 06/07/2008 (06/07/2008 16:56) Added new observation of LAB RESULTS: Lot 04-06 exp 6-10 (06/07/2008 16:56) Added new observation of HEMOCCULT 3: negative (06/07/2008 16:56) Added new observation of HEMOCCULT 2: negative (06/07/2008 16:56) Added new observation of HEMOCCULT 1: negative (06/07/2008 16:56)     Laboratory Results  Date/Time Received: June 07, 2008 4:56 PM   Stool - Occult Blood Hemmoccult #1: negative Date: 06/07/2008 Hemoccult #2: negative Date: 06/07/2008 Hemoccult #3: negative Date: 06/07/2008 Comments: Lot 04-06 exp 6-10  Results normal Does not rule out neoplasm and pre-cancerous polyps Appt as set Nurse to call  Appended Document: Orders Update    Clinical Lists Changes  Problems: Added new problem of SPECIAL SCREENING FOR MALIGNANT NEOPLASMS COLON (ICD-V76.51) Orders: Added new Service order of Hemoccult Cards -3 specimans (take home) (16109) - Signed      Appended Document: Occult Blood pt notified of results.

## 2010-05-09 NOTE — Assessment & Plan Note (Signed)
Summary: work in/dizzy/?sinus/arc   Vital Signs:  Patient Profile:   60 Years Old Male Height:     73 inches Weight:      257 pounds BMI:     34.03 O2 Sat:      99 % Temp:     97.8 degrees F Pulse rate:   97 / minute Pulse (ortho):   100 / minute Resp:     14 per minute BP sitting:   149 / 94 BP standing:   155 / 99  Vitals Entered By: Sherilyn Banker (July 28, 2007 2:31 PM)                 Serial Vital Signs/Assessments:  Time      Position  BP       Pulse  Resp  Temp     By           Lying LA  144/97   97                    Kim French           Sitting   141/91   90                    Kim French           Standing  155/99   100                   Sherilyn Banker   PCP:  Franchot Heidelberg, MD  Chief Complaint:  sinus congestion, ear pain, and dizzy.  History of Present Illness: Pt in for acute visit.  He states he got sick last week after mowing lawn. He uses dust mask normally but did not this time.He states 3 days after mowing started having sinus trouble. He notes he turned head in bed and felt like falling out. States grabbed sides and wife though he was crazy. Sx cleared after lying dead stilll. States has tapered off now and not nearly as bad. STates sx worse with lying down and ears popping. Describes raw tyope of feeling in them. He has stuffy nose and notes has PND. States has blown but nothing wants to come out.  States he has had to mouth breath as he gets that congested. He has itchy eyes and watering. He has been sneezing and notes miserable. He denies sore throat.  He denies headaches, tremor, weakness and blackouts.   He denies chest pain, SOB, orthopnea, PND and ankle edema. No palpitations.  He has a good apetite. Denies nausea and vomitting. NO report of diarrhea or constipation.  He has no urinary sx and denies frequency and nocturia.   He denies ill contacts and travel. He has self treated with premier allergy relief. He got this at [facility] and states  not as dizzy since. Has only had two doses.   Now presents.  Hypertension History:      He denies headache, chest pain, palpitations, dyspnea with exertion, orthopnea, PND, peripheral edema, visual symptoms, neurologic problems, syncope, and side effects from treatment.  He notes no problems with any antihypertensive medication side effects.  Further comments include: On AMlodipine. States running high at drug store - 140s over 90s to low 100s.        Positive major cardiovascular risk factors include male age 37 years old or older, hyperlipidemia, and hypertension.  Negative major cardiovascular risk factors include no history of diabetes, negative family history  for ischemic heart disease, and non-tobacco-user status.        Further assessment for target organ damage reveals no history of ASHD, stroke/TIA, or peripheral vascular disease.    Lipid Management History:      Positive NCEP/ATP III risk factors include male age 46 years old or older and hypertension.  Negative NCEP/ATP III risk factors include non-diabetic, no family history for ischemic heart disease, non-tobacco-user status, no ASHD (atherosclerotic heart disease), no prior stroke/TIA, no peripheral vascular disease, and no history of aortic aneurysm.        The patient states that he knows about the "Therapeutic Lifestyle Change" diet.  His compliance with the TLC diet is fair.        Prior Medications Reviewed Using: Patient Recall  Updated Prior Medication List: NORVASC 5 MG TABS (AMLODIPINE BESYLATE) once daily LISINOPRIL-HYDROCHLOROTHIAZIDE 20-25 MG TABS (LISINOPRIL-HYDROCHLOROTHIAZIDE) once daily FISH OIL 1000 MG CAPS (OMEGA-3 FATTY ACIDS) once daily FLAX SEED OIL 1000 MG CAPS (FLAXSEED (LINSEED)) once daily ASPIR-LOW 81 MG TBEC (ASPIRIN) One daily  Current Allergies (reviewed today): ! * IVP DYE  Past Medical History:    Reviewed history from 06/20/2007 and no changes required:       HYPERTENSION (ICD-401)        HERNIA, UMBILICAL (ICD-553.1)       HYPERGLYCEMIA (ICD-790.6)       HYPERLIPIDEMIA (ICD-272.4)         Past Surgical History:    Reviewed history from 07/29/2006 and no changes required:       Denies surgical history   Family History:    Reviewed history from 07/29/2006 and no changes required:       Father: CAD; CVA; MI Dead at 34       Mother: CVA; DM Dead at 90       1 sister: Colon CA 7  Social History:    Reviewed history from 07/29/2006 and no changes required:       Married       Never Smoked       Alcohol use-no       Drug use-no       Occupation: Best boy for [employer]    Review of Systems      See HPI   Physical Exam  General:     Well-developed,well-nourished,in no acute distress; alert,appropriate and cooperative throughout examination Head:     Normocephalic and atraumatic without obvious abnormalities. No apparent alopecia or balding. Eyes:     Mild corneal injection. Normal EOM. PERRLA. Ears:     External ear exam shows no significant lesions or deformities.  Otoscopic examination reveals clear canals, tympanic membranes are intact bilaterally without bulging, retraction, inflammation or discharge. Hearing is grossly normal bilaterally. Nose:     Mod congestion with large turbinates. Mouth:     Oral mucosa and oropharynx without lesions or exudates.   Neck:     No deformities, masses, or tenderness noted. Lungs:     Normal respiratory effort, chest expands symmetrically. Lungs are clear to auscultation, no crackles or wheezes. Heart:     Normal rate and regular rhythm. S1 and S2 normal without gallop, murmur, click, rub or other extra sounds. Abdomen:     Bowel sounds positive,abdomen soft and non-tender without masses, organomegaly or hernias noted. Extremities:     No clubbing, cyanosis, edema, or deformity noted with normal full range of motion of all joints.   Neurologic:     No cranial nerve deficits  noted. Station and gait are  normal. Plantar reflexes are down-going bilaterally. DTRs are symmetrical throughout. Sensory, motor and coordinative functions appear intact.    Impression & Recommendations:  Problem # 1:  ALLERGIC RHINITIS, SEASONAL (ICD-477.0) Severe. Single dose Decadron/Depomedrol and add Nasonex to OTC allergy rx. Advised risk and benefit of med use and encouraged mask when mowing. Track pollen counts on [email or web address] and  update if worse. Agrees.  Problem # 2:  EUSTACHIAN TUBE DYSFUNCTION (ICD-381.81) See above. Valasalva manuever q2 hours, nasal steroid.   Problem # 3:  HYPERTENSION (ICD-401) BP high. Increase Amlodipine to 10 mg and cont Lisinopril/HCTZ as is. Advised diet, exersize and low salt intake. recheck 4 weeks. UA normal except for few ketones. Admits did not eat yet - encouraged  to do.  His updated medication list for this problem includes:    Amlodipine Besylate 10 Mg Tabs (Amlodipine besylate) ..... One daily    Lisinopril-hydrochlorothiazide 20-25 Mg Tabs (Lisinopril-hydrochlorothiazide) ..... Once daily  Orders: UA Dipstick w/o Micro (manual) (16109)   Complete Medication List: 1)  Amlodipine Besylate 10 Mg Tabs (Amlodipine besylate) .... One daily 2)  Lisinopril-hydrochlorothiazide 20-25 Mg Tabs (Lisinopril-hydrochlorothiazide) .... Once daily 3)  Fish Oil 1000 Mg Caps (Omega-3 fatty acids) .... Once daily 4)  Flax Seed Oil 1000 Mg Caps (Flaxseed (linseed)) .... Once daily 5)  Aspir-low 81 Mg Tbec (Aspirin) .... One daily  Hypertension Assessment/Plan:      The patient's hypertensive risk group is category B: At least one risk factor (excluding diabetes) with no target organ damage.  His calculated 10 year risk of coronary heart disease is 14 %.  Today's blood pressure is 149/94.  His blood pressure goal is < 140/90.  Lipid Assessment/Plan:      Based on NCEP/ATP III, the patient's risk factor category is "2 or more risk factors and a calculated 10 year CAD risk of < 20%".   From this information, the patient's calculated lipid goals are as follows: Total cholesterol goal is 200; LDL cholesterol goal is 130; HDL cholesterol goal is 40; Triglyceride goal is 150.     Patient Instructions: 1)  Please schedule a follow-up appointment in 1 month - sooner if needed or if sx worsen.    Prescriptions: AMLODIPINE BESYLATE 10 MG  TABS (AMLODIPINE BESYLATE) One daily  #90 x 1   Entered and Authorized by:   Franchot Heidelberg MD   Signed by:   Franchot Heidelberg MD on 07/28/2007   Method used:   Print then Give to Patient   RxID:   [id number]  ] Laboratory Results   Urine Tests    Routine Urinalysis   Color: yellow Appearance: Clear Glucose: negative   (Normal Range: Negative) Bilirubin: negative   (Normal Range: Negative) Ketone: trace (5)   (Normal Range: Negative) Spec. Gravity: 1.020   (Normal Range: 1.003-1.035) Blood: negative   (Normal Range: Negative) pH: 5.5   (Normal Range: 5.0-8.0) Protein: negative   (Normal Range: Negative) Urobilinogen: 0.2   (Normal Range: 0-1) Nitrite: negative   (Normal Range: Negative) Leukocyte Esterace: negative   (Normal Range: Negative)        Appended Document: Orders Update    Clinical Lists Changes  Orders: Added new Service order of Decadron 1mg  (N5621) - Signed Added new Service order of Depo- Medrol 80mg  (J1040) - Signed Added new Service order of Admin of Therapeutic Inj  intramuscular or subcutaneous ([id number]) - Signed        Medication Administration  Injection #  1:    Medication: Decadron 1mg     Diagnosis: ALLERGIC RHINITIS, SEASONAL (ICD-477.0)    Route: IM    Site: L thigh    Exp Date: 04/09/2009    Lot #: [id number]    Mfr: American Regent    Patient tolerated injection without complications    Given by: Sherilyn Banker (July 28, 2007 3:27 PM)  Injection # 2:    Medication: Depo- Medrol 80mg     Diagnosis: ALLERGIC RHINITIS, SEASONAL (ICD-477.0)    Route: IM    Exp Date:  08/07/2008    Mfr: sicor    Patient tolerated injection without complications    Given by: Sherilyn Banker (July 28, 2007 3:28 PM)  Orders Added: 1)  Decadron 1mg  [J1094] 2)  Depo- Medrol 80mg  [J1040] 3)  Admin of Therapeutic Inj  intramuscular or subcutaneous [[id number]]

## 2010-05-09 NOTE — Assessment & Plan Note (Signed)
Summary: WORK IN/BP/ARC   Vital Signs:  Patient Profile:   60 Years Old Male Height:     73 inches Weight:      256 pounds BMI:     33.90 O2 Sat:      99 % Temp:     97.3 degrees F Pulse rate:   94 / minute Resp:     14 per minute BP sitting:   150 / 98  Vitals Entered By: Sherilyn Banker (August 04, 2007 4:10 PM)                 PCP:  Franchot Heidelberg, MD  Chief Complaint:  high BP.  History of Present Illness: Pt in for acute visit.  He called in today and was concerned about BP. He has been checking BP several times a day and states high - woke this am 147/110 at 6 am, then 140sover low 100s rest of day. He denies chest pain, SOB, orthopna, PND, ankle edema and palpitatations. He is watching salt intakie. He is exersizing but not so much as he has been a little dizzy with URI. He states this is getting a little better with the nasonex, OTC Allergy relief and Decdron/Depomedrol injection. States not nearly as dizzy as before and states feels like world is spinning around him. He denies headache, tremor and weakness and has not had falls. He states nose less stuffy and sneezing less. Eyes less itchy. NOtes does have some PND and makes him have a scratchy throat  - similar with ears. He has good apetitie. Denies nausea and vomitting. No diarrhea or constipation. No bloody stools. Cough does have yellow tinge to sputum and brings this up at times.  He now presents.    Updated Prior Medication List: AMLODIPINE BESYLATE 10 MG  TABS (AMLODIPINE BESYLATE) One daily LISINOPRIL-HYDROCHLOROTHIAZIDE 20-25 MG TABS (LISINOPRIL-HYDROCHLOROTHIAZIDE) once daily FISH OIL 1000 MG CAPS (OMEGA-3 FATTY ACIDS) once daily FLAX SEED OIL 1000 MG CAPS (FLAXSEED (LINSEED)) once daily ASPIR-LOW 81 MG TBEC (ASPIRIN) One daily  Current Allergies (reviewed today): ! * IVP DYE  Past Medical History:    Reviewed history from 06/20/2007 and no changes required:       HYPERTENSION (ICD-401)       HERNIA,  UMBILICAL (ICD-553.1)       HYPERGLYCEMIA (ICD-790.6)       HYPERLIPIDEMIA (ICD-272.4)         Past Surgical History:    Reviewed history from 07/29/2006 and no changes required:       Denies surgical history   Family History:    Reviewed history from 07/29/2006 and no changes required:       Father: CAD; CVA; MI Dead at 32       Mother: CVA; DM Dead at 68       1 sister: Colon CA 68  Social History:    Reviewed history from 07/29/2006 and no changes required:       Married       Never Smoked       Alcohol use-no       Drug use-no       Occupation: Best boy for [employer]   Risk Factors: Tobacco use:  never Drug use:  no Alcohol use:  no  Family History Risk Factors:    Family History of MI in females < 98 years old:  no    Family History of MI in males < 42 years old:  no   Review  of Systems      See HPI   Physical Exam  General:     Well-developed,well-nourished,in no acute distress; alert,appropriate and cooperative throughout examination Head:     Normocephalic and atraumatic without obvious abnormalities.  Eyes:     No corneal or conjunctival inflammation noted. EOMI. Perrla. Eyes not injected Ears:     External ear exam shows no significant lesions or deformities.  Otoscopic examination reveals clear canals, tympanic membranes are intact bilaterally without bulging, retraction, inflammation or discharge. Hearing is grossly normal bilaterally. Nose:     Mild congestion with large turbinates. Mouth:     Oral mucosa and oropharynx without lesions or exudates.  Teeth in good repair. Lungs:     Normal respiratory effort, chest expands symmetrically. Lungs are clear to auscultation, no crackles or wheezes. Heart:     Normal rate and regular rhythm. S1 and S2 normal without gallop, murmur, click, rub or other extra sounds. Abdomen:     Bowel sounds positive,abdomen soft and non-tender without masses, organomegaly or hernias noted. Extremities:     No  clubbing, cyanosis, edema, or deformity noted with normal full range of motion of all joints.   Neurologic:     No cranial nerve deficits noted. Station and gait are normal. Plantar reflexes are down-going bilaterally. DTRs are symmetrical throughout. Sensory, motor and coordinative functions appear intact. Psych:     Anxious about BP and states juts trying to stick around.    Impression & Recommendations:  Problem # 1:  HYPERTENSION (ICD-401) BP rechecked at end of visit and 143/93. Advised improved. Councelled 6 weeks until med will reach full benefit. 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 States has heard from many of them that HTN put them where they are at. Councelled we are working to get this down and will doso aggressively. Somehwat slow process given time it takes for meds to reach efficacy. Reassurance given and advised need to check BP once a week in rest for 10 minutes. Councelled low salt intake. Advised healthy diet and exersize and stressed need for weight loss as well. Agrees. Recheck as scheduled - sooner if concern. His updated medication list for this problem includes:    Amlodipine Besylate 10 Mg Tabs (Amlodipine besylate) ..... One daily    Lisinopril-hydrochlorothiazide 20-25 Mg Tabs (Lisinopril-hydrochlorothiazide) ..... Once daily   Problem # 2:  ALLERGIC RHINITIS, SEASONAL (ICD-477.0) Improved. Meds as is.   Problem # 3:  EUSTACHIAN TUBE DYSFUNCTION (ICD-381.81) Likely cause for vertigo. Advised short trial Meclizine. Agreeable. Advised risk and benefit. Update if dizzyness persist or worsens.  Complete Medication List: 1)  Amlodipine Besylate 10 Mg Tabs (Amlodipine besylate) .... One daily 2)  Lisinopril-hydrochlorothiazide 20-25 Mg Tabs (Lisinopril-hydrochlorothiazide) .... Once daily 3)  Fish Oil 1000 Mg Caps (Omega-3 fatty acids) .... Once daily 4)  Flax Seed Oil 1000 Mg Caps (Flaxseed  (linseed)) .... Once daily 5)  Aspir-low 81 Mg Tbec (Aspirin) .... One daily 6)  Meclizine Hcl 25 Mg Tabs (Meclizine hcl) .... One every 6 hours as needed 7)  Nasonex 50 Mcg/act Susp (Mometasone furoate) .... 2 squirts per nostril once daily   Patient Instructions: 1)  As scheduled in 3 weeks - sooner if needed.    Prescriptions: NASONEX 50 MCG/ACT  SUSP (MOMETASONE FUROATE) 2 squirts per nostril once daily  #1 x 3   Entered and Authorized by:   Franchot Heidelberg MD   Signed by:  Franchot Heidelberg MD on 08/04/2007   Method used:   Print then Give to Patient   RxID:   [id number] MECLIZINE HCL 25 MG  TABS (MECLIZINE HCL) One every 6 hours as needed  #30 x 0   Entered and Authorized by:   Franchot Heidelberg MD   Signed by:   Franchot Heidelberg MD on 08/04/2007   Method used:   Print then Give to Patient   RxID:   [id number]  ]

## 2010-05-09 NOTE — Assessment & Plan Note (Signed)
Summary: FOLLOW UP 1 MONTH/SLJ   Vital Signs:  Patient Profile:   60 Years Old Male Height:     73 inches Weight:      269 pounds BMI:     35.62 O2 Sat:      99 % Pulse rate:   90 / minute Resp:     14 per minute BP sitting:   155 / 107  Vitals Entered By: Sherilyn Banker LPN (May 31, 2008 4:15 PM)                 PCP:  Franchot Heidelberg, MD  Chief Complaint:  lab follow up.  History of Present Illness: Pt in for recheck.  He had recent labs and this is reviewed:  1. CBC - normal 2. CMP - normal 3. TSH - normal 4. Lipids - see below. 5. PSA -  normal  He now presents.  Hypertension History:      He denies headache, chest pain, palpitations, dyspnea with exertion, orthopnea, PND, peripheral edema, visual symptoms, neurologic problems, syncope, and side effects from treatment.  He notes no problems with any antihypertensive medication side effects.  Further comments include: He is using BP meds randomly. States not as consitant as usual. States hs varying shifts and believes Rx makes him pee more. States will start taking each morning from now on.        Positive major cardiovascular risk factors include male age 82 years old or older, hyperlipidemia, and hypertension.  Negative major cardiovascular risk factors include no history of diabetes, negative family history for ischemic heart disease, and non-tobacco-user status.        Further assessment for target organ damage reveals no history of ASHD, stroke/TIA, or peripheral vascular disease.    Lipid Management History:      Positive NCEP/ATP III risk factors include male age 76 years old or older and hypertension.  Negative NCEP/ATP III risk factors include non-diabetic, no family history for ischemic heart disease, non-tobacco-user status, no ASHD (atherosclerotic heart disease), no prior stroke/TIA, no peripheral vascular disease, and no history of aortic aneurysm.        The patient states that he knows about the  "Therapeutic Lifestyle Change" diet.  His compliance with the TLC diet is fair.  The patient expresses understanding of adjunctive measures for cholesterol lowering.  Comments: He has not used his flaxseed or sih oil in thelast 10 to 12 months and states must be cause for elevated Trig. He is still exersizing but has fallen of wagon some. States will get back with walking and weight lifting.      Prior Medications Reviewed Using: Patient Recall  Updated Prior Medication List: AMLODIPINE BESYLATE 5 MG  TABS (AMLODIPINE BESYLATE) One daily LISINOPRIL-HYDROCHLOROTHIAZIDE 20-12.5 MG  TABS (LISINOPRIL-HYDROCHLOROTHIAZIDE) One daily FISH OIL 1000 MG CAPS (OMEGA-3 FATTY ACIDS) once daily FLAX SEED OIL 1000 MG CAPS (FLAXSEED (LINSEED)) once daily ASPIR-LOW 81 MG TBEC (ASPIRIN) One daily VERAMYST 27.5 MCG/SPRAY SUSP (FLUTICASONE FUROATE) Two squirts per nostril daily LORATADINE 10 MG  TABS (LORATADINE) One daily  Current Allergies (reviewed today): ! * IVP DYE  Past Medical History:    Reviewed history from 11/07/2007 and no changes required:       Current Problems:        MORBID OBESITY (ICD-278.01)       LEG EDEMA, BILATERAL (ICD-782.3)       EUSTACHIAN TUBE DYSFUNCTION (ICD-381.81)       ALLERGIC RHINITIS, SEASONAL (ICD-477.0)  HYPERTENSION (ICD-401)       HERNIA, UMBILICAL (ICD-553.1)       HYPERGLYCEMIA (ICD-790.6)       HYPERLIPIDEMIA (ICD-272.4)                Past Surgical History:    Reviewed history from 09/26/2007 and no changes required:       Denies surgical history       Heart cat - 2006 - normal coronaries   Family History:    Reviewed history from 07/29/2006 and no changes required:       Father: CAD; CVA; MI Dead at 30       Mother: CVA; DM Dead at 33       1 sister: Colon CA 70 - dx at age 91  Social History:    Reviewed history from 07/29/2006 and no changes required:       Married       Never Smoked       Alcohol use-no       Drug use-no        Occupation: Best boy for [employer]   Risk Factors: Tobacco use:  never Drug use:  no Alcohol use:  no  Family History Risk Factors:    Family History of MI in females < 50 years old:  no    Family History of MI in males < 9 years old:  no  Colonoscopy History:     Date of Last Colonoscopy:  05/31/2008    Results:  Advised   Review of Systems      See HPI  General      Denies chills, fever, and sweats.  Resp      Denies cough, shortness of breath, sputum productive, and wheezing.  GI      Denies abdominal pain, constipation, diarrhea, nausea, and vomiting.      Has fam hx of colon cancer in sister age 41 - now 3. States  will bring stool cards in - was going to do earlier but got busy. Promises to have in by Friday. Declines colonoscopy and sights cost at present.  GU      Denies nocturia, urinary frequency, and urinary hesitancy.      He denies family hx of prostate cancer. Denies nocturia. States may pee once a night if really drinks a lot before bed.  Psych      Denies anxiety and depression.   Physical Exam  General:     Well-developed,well-nourished,in no acute distress; alert,appropriate and cooperative throughout examination Lungs:     Normal respiratory effort, chest expands symmetrically. Lungs are clear to auscultation, no crackles or wheezes. Heart:     Normal rate and regular rhythm. S1 and S2 normal without gallop, murmur, click, rub or other extra sounds. Abdomen:     Bowel sounds positive,abdomen soft and non-tender without masses, organomegaly with grape sized umbilical hernia at 13. No pain and present for years. Genitalia:     Refuses rectal Prostate:     Rfuses rectal Extremities:     No clubbing, cyanosis, edema, or deformity noted with normal full range of motion of all joints.      Impression & Recommendations:  Problem # 1:  HYPERTENSION (ICD-401) BP high. Some inconsistancy in use of meds. Will start daily in am. Advised to  track use of Rx and check on urinary frequency. Update in 4 weeks. Councelled cont TLC with diet, exersize and low fat intake. Advised yearly eye exam and  dental care as well and educated why this is important. His updated medication list for this problem includes:    Amlodipine Besylate 5 Mg Tabs (Amlodipine besylate) ..... One daily    Lisinopril-hydrochlorothiazide 20-12.5 Mg Tabs (Lisinopril-hydrochlorothiazide) ..... One daily   Problem # 2:  HYPERLIPIDEMIA (ICD-272.4) Non fasting per patient. Resume fish oil and flax seed. Recheck 6 months.  Problem # 3:  MORBID OBESITY (ICD-278.01) Discussed. Councelled diet, portion control and edcuated on of exersize 5 days a week. Aim for one pound per week weight loss. Recheck 3 months.  Problem # 4:  Preventive Health Care (ICD-V70.0) Advised rectal and refused. Edcuated risk and benefit of doing this and why it is improtant in prostate cancer screen. Aware and notes may do next visit. He has yet to bring stool cards in. Advised how sister had colon cancer at age 8 and he needs to be screened with colonoscopy as high risk.Advised at dx of colon cancer entials with surgery, chemoa, raditation, feeding tubes and possibly death.  Aware and states will bring stool cards in and will check on deducatbl with insurance. Will update me on next visit and "I promise to have stool cards here by Friday".   He will update in 3 months if he wishes to proceed with colonoscopy.  Complete Medication List: 1)  Amlodipine Besylate 5 Mg Tabs (Amlodipine besylate) .... One daily 2)  Lisinopril-hydrochlorothiazide 20-12.5 Mg Tabs (Lisinopril-hydrochlorothiazide) .... One daily 3)  Fish Oil 1000 Mg Caps (Omega-3 fatty acids) .... Once daily 4)  Flax Seed Oil 1000 Mg Caps (Flaxseed (linseed)) .... Once daily 5)  Aspir-low 81 Mg Tbec (Aspirin) .... One daily 6)  Veramyst 27.5 Mcg/spray Susp (Fluticasone furoate) .... Two squirts per nostril daily 7)  Loratadine  10 Mg Tabs (Loratadine) .... One daily  Hypertension Assessment/Plan:      The patient's hypertensive risk group is category B: At least one risk factor (excluding diabetes) with no target organ damage.  His calculated 10 year risk of coronary heart disease is 9 %.  Today's blood pressure is 155/107.  His blood pressure goal is < 140/90.  Lipid Assessment/Plan:      Based on NCEP/ATP III, the patient's risk factor category is "2 or more risk factors and a calculated 10 year CAD risk of < 20%".  From this information, the patient's calculated lipid goals are as follows: Total cholesterol goal is 200; LDL cholesterol goal is 130; HDL cholesterol goal is 40; Triglyceride goal is 150.     Patient Instructions: 1)  Please schedule a follow-up appointment in 3 months.     Preventive Care Screening  Colonoscopy:    Date:  05/31/2008    Results:  Advised  PSA:    Next Due:  06/2009

## 2010-05-09 NOTE — Miscellaneous (Signed)
Summary: Rx dose change  Clinical Lists Changes  Medications: Changed medication from LISINOPRIL-HYDROCHLOROTHIAZIDE 20-12.5 MG  TABS (LISINOPRIL-HYDROCHLOROTHIAZIDE) One daily to LISINOPRIL-HYDROCHLOROTHIAZIDE 20-12.5 MG  TABS (LISINOPRIL-HYDROCHLOROTHIAZIDE) two times a day

## 2010-05-09 NOTE — Progress Notes (Signed)
Summary: 06/20/07 lab results  Phone Note Outgoing Call   Call placed by: Sonny Dandy,  June 23, 2007 9:33 AM Summary of Call: message left for patient to return call .................................................................Marland KitchenMarland KitchenSonny Dandy  June 23, 2007 9:33 AM   called, no answer, letter mailed .................................................................Marland KitchenMarland KitchenSonny Dandy  June 24, 2007 8:23 AM   Initial call taken by: Sonny Dandy,  June 24, 2007 8:23 AM         Appended Document: 06/20/07 lab results Patient called and results given

## 2010-05-09 NOTE — Assessment & Plan Note (Signed)
Summary: follow up 2 month/slj   Vital Signs:  Patient Profile:   60 Years Old Male Height:     73 inches Weight:      272 pounds BMI:     36.02 O2 Sat:      99 % Pulse rate:   80 / minute Resp:     14 per minute BP sitting:   134 / 81  Vitals Entered By: Sherilyn Banker (January 02, 2008 3:55 PM)                 PCP:  Franchot Heidelberg, MD  Chief Complaint:  follow up visit.  History of Present Illness: Pt in for recheck.  He was last seent wo months ago and has been struggling with allergies last few weeks. States has stuffy nose and some ear fullness. He states he hs been sneezing some and states his nose is stffy with clear liquid. States congested. He has used the naal spray and this worked better than the other Rx. He is using the claritin but admits stpped for few days. He has not had fever, chills or sweats. He notes no ill contacts or travel. 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 He has not had flu shot yet. States wants to get upper airway signs better first - or at least this is his opinion.  He was supposed to bring stool cards back and never did. States he is very sorry and misplaced. He has a good apetite. He denies nausea and vomitting. No diarrhea and constipation. No bloody stools. His wife did have colonoscopy and was not bad. He is not open to this himself though and blames medical bills as cause.  He is obese. He has gained about 5 pounds. States not walking as he should. States watching diet some but can do better.  He now presents.  Hypertension History:      He denies headache, chest pain, palpitations, dyspnea with exertion, orthopnea, PND, peripheral edema, visual symptoms, neurologic problems, syncope, and side effects from treatment.  He notes no problems with any antihypertensive medication side effects.        Positive major cardiovascular risk factors include male age 55 years old or older,  hyperlipidemia, and hypertension.  Negative major cardiovascular risk factors include no history of diabetes, negative family history for ischemic heart disease, and non-tobacco-user status.        Further assessment for target organ damage reveals no history of ASHD, stroke/TIA, or peripheral vascular disease.    Lipid Management History:      Positive NCEP/ATP III risk factors include male age 47 years old or older and hypertension.  Negative NCEP/ATP III risk factors include non-diabetic, no family history for ischemic heart disease, non-tobacco-user status, no ASHD (atherosclerotic heart disease), no prior stroke/TIA, no peripheral vascular disease, and no history of aortic aneurysm.        The patient states that he knows about the "Therapeutic Lifestyle Change" diet.  His compliance with the TLC diet is fair.  The patient expresses understanding of adjunctive measures for cholesterol lowering.  Comments: Notes needs to work on diet and exersize.      Prior Medications Reviewed Using: Patient Recall  Updated Prior Medication List: AMLODIPINE BESYLATE 5 MG  TABS (AMLODIPINE BESYLATE) One daily LISINOPRIL-HYDROCHLOROTHIAZIDE 20-12.5 MG  TABS (LISINOPRIL-HYDROCHLOROTHIAZIDE) One daily FISH OIL 1000 MG CAPS (OMEGA-3 FATTY ACIDS) once daily FLAX SEED OIL 1000 MG CAPS (FLAXSEED (LINSEED)) once  daily ASPIR-LOW 81 MG TBEC (ASPIRIN) One daily VERAMYST 27.5 MCG/SPRAY SUSP (FLUTICASONE FUROATE) Two squirts per nostril daily LORATADINE 10 MG  TABS (LORATADINE) One daily  Current Allergies (reviewed today): ! * IVP DYE  Past Medical History:    Reviewed history from 11/07/2007 and no changes required:       Current Problems:        MORBID OBESITY (ICD-278.01)       LEG EDEMA, BILATERAL (ICD-782.3)       EUSTACHIAN TUBE DYSFUNCTION (ICD-381.81)       ALLERGIC RHINITIS, SEASONAL (ICD-477.0)       HYPERTENSION (ICD-401)       HERNIA, UMBILICAL (ICD-553.1)       HYPERGLYCEMIA (ICD-790.6)        HYPERLIPIDEMIA (ICD-272.4)                Past Surgical History:    Reviewed history from 09/26/2007 and no changes required:       Denies surgical history       Heart cat - 2006 - normal coronaries   Family History:    Reviewed history from 07/29/2006 and no changes required:       Father: CAD; CVA; MI Dead at 53       Mother: CVA; DM Dead at 81       1 sister: Colon CA 96  Social History:    Reviewed history from 07/29/2006 and no changes required:       Married       Never Smoked       Alcohol use-no       Drug use-no       Occupation: Best boy for [employer]    Review of Systems      See HPI   Physical Exam  General:     Well-developed,well-nourished,in no acute distress; alert,appropriate and cooperative throughout examination Head:     Normocephalic and atraumatic without obvious abnormalities.  Eyes:     No corneal or conjunctival inflammation noted. EOMI. Perrla. Eyes injected Ears:     External ear exam shows no significant lesions or deformities.  Otoscopic examination reveals clear canals, tympanic membranes are intact bilaterally without bulging, retraction, inflammation or discharge. Hearing is grossly normal bilaterally. Nose:     Mild congestion with large turbinates. Mouth:     Oral mucosa and oropharynx without lesions or exudates.  Teeth in good repair. Lungs:     Normal respiratory effort, chest expands symmetrically. Lungs are clear to auscultation, no crackles or wheezes. Heart:     Normal rate and regular rhythm. S1 and S2 normal without gallop, murmur, click, rub or other extra sounds. Abdomen:     Bowel sounds positive,abdomen soft and non-tender without masses, organomegaly or hernias noted. Extremities:     No clubbing, cyanosis, edema, or deformity noted with normal full range of motion of all joints.   Cervical Nodes:     No lymphadenopathy noted Psych:     Cognition and judgment appear intact. Alert and cooperative with  normal attention span and concentration. No apparent delusions, illusions, hallucinations    Impression & Recommendations:  Problem # 1:  ALLERGIC RHINITIS, SEASONAL (ICD-477.0) Discussed. Single dose Decadron/Depomedrol. Resume allergy rx daily and councelled not to miss doses. Advised air filter changes, avoidance of precipitators. Update if worsen.  Problem # 2:  HYPERTENSION (ICD-401) Stable. Advised need for BMP to check renal fucntion and lytes. Declines sighting cost. Will do in 3 months. Aware of  risk. Advised eye and dental care. His updated medication list for this problem includes:    Amlodipine Besylate 5 Mg Tabs (Amlodipine besylate) ..... One daily    Lisinopril-hydrochlorothiazide 20-12.5 Mg Tabs (Lisinopril-hydrochlorothiazide) ..... One daily   Problem # 3:  HYPERLIPIDEMIA (ICD-272.4) Repeat labs in 3 months. TLC a must.  Problem # 4:  MORBID OBESITY (ICD-278.01) Weight gain noted. Again educated diet, exercise and portion control.  Councelled improtance of this to reduce DM, CAD, CVA and DJD risk. Aware and agreeable.  Problem # 5:  Preventive Health Care (ICD-V70.0) Discussed. Advised flu-shot. States will get at work. We will re-attempt stool studies. Aware of improtance. Declines colonoscopy eval.  Complete Medication List: 1)  Amlodipine Besylate 5 Mg Tabs (Amlodipine besylate) .... One daily 2)  Lisinopril-hydrochlorothiazide 20-12.5 Mg Tabs (Lisinopril-hydrochlorothiazide) .... One daily 3)  Fish Oil 1000 Mg Caps (Omega-3 fatty acids) .... Once daily 4)  Flax Seed Oil 1000 Mg Caps (Flaxseed (linseed)) .... Once daily 5)  Aspir-low 81 Mg Tbec (Aspirin) .... One daily 6)  Veramyst 27.5 Mcg/spray Susp (Fluticasone furoate) .... Two squirts per nostril daily 7)  Loratadine 10 Mg Tabs (Loratadine) .... One daily  Hypertension Assessment/Plan:      The patient's hypertensive risk group is category B: At least one risk factor (excluding diabetes) with no target  organ damage.  His calculated 10 year risk of coronary heart disease is 11 %.  Today's blood pressure is 134/81.  His blood pressure goal is < 140/90.  Lipid Assessment/Plan:      Based on NCEP/ATP III, the patient's risk factor category is "2 or more risk factors and a calculated 10 year CAD risk of < 20%".  From this information, the patient's calculated lipid goals are as follows: Total cholesterol goal is 200; LDL cholesterol goal is 130; HDL cholesterol goal is 40; Triglyceride goal is 150.     Patient Instructions: 1)  Please schedule a follow-up appointment in 3 months.   Prescriptions: VERAMYST 27.5 MCG/SPRAY SUSP (FLUTICASONE FUROATE) Two squirts per nostril daily  #1 x 3   Entered and Authorized by:   Franchot Heidelberg MD   Signed by:   Franchot Heidelberg MD on 01/02/2008   Method used:   Print then Give to Patient   RxID:   [id number] LORATADINE 10 MG  TABS (LORATADINE) One daily  #30 x 3   Entered and Authorized by:   Franchot Heidelberg MD   Signed by:   Franchot Heidelberg MD on 01/02/2008   Method used:   Print then Give to Patient   RxID:   [id number] LISINOPRIL-HYDROCHLOROTHIAZIDE 20-12.5 MG  TABS (LISINOPRIL-HYDROCHLOROTHIAZIDE) One daily  #60 x 3   Entered and Authorized by:   Franchot Heidelberg MD   Signed by:   Franchot Heidelberg MD on 01/02/2008   Method used:   Print then Give to Patient   RxID:   [id number]  ]  Preventive Care Screening  Last Flu Shot:    Date:  01/02/2008    Next Due:  01/2009    Results:  Will get at work  PSA:    Next Due:  12/2007  Appended Document: Orders Update    Clinical Lists Changes  Orders: Added new Service order of Depo- Medrol 80mg  (J1040) - Signed Added new Service order of Dexamethasone Sodium Phosphate 1mg  (J1100) - Signed Added new Service order of Admin of Therapeutic Inj  intramuscular or subcutaneous (29518) - Signed       Medication Administration  Injection #  1:    Medication:  Depo- Medrol 80mg     Diagnosis: ALLERGIC RHINITIS, SEASONAL (ICD-477.0)    Route: IM    Site: L thigh    Exp Date: 09/07/2008    Lot #: [id number]    Mfr: icor    Patient tolerated injection without complications    Given by: Sherilyn Banker (January 02, 2008 4:23 PM)  Injection # 2:    Medication: Dexamethasone Sodium Phosphate 1mg     Diagnosis: ALLERGIC RHINITIS, SEASONAL (ICD-477.0)    Route: IM    Site: L thigh    Exp Date: 09/07/2009    Lot #: [id number]    Mfr: [employer]    Patient tolerated injection without complications    Given by: Sherilyn Banker (January 02, 2008 4:23 PM)  Orders Added: 1)  Depo- Medrol 80mg  [J1040] 2)  Dexamethasone Sodium Phosphate 1mg  [J1100] 3)  Admin of Therapeutic Inj  intramuscular or subcutaneous [54098]

## 2010-05-09 NOTE — Letter (Signed)
Summary: External Other  External Other   Imported By: Donneta Romberg 08/30/2006 11:19:25  _____________________________________________________________________  External Attachment:    Type:   Image     Comment:   External Document

## 2010-08-25 NOTE — Cardiovascular Report (Signed)
Chris Lucas, Chris Lucas             ACCOUNT NO.:  [id number]   MEDICAL RECORD NO.:  [id number]          PATIENT TYPE:  OIB   LOCATION:  6501                         FACILITY:  [facility]   PHYSICIAN:  Vida Roller, M.D.   DATE OF BIRTH:  03-07-1951   DATE OF PROCEDURE:  07/13/2004  DATE OF DISCHARGE:                              CARDIAC CATHETERIZATION   PRIMARY:  Greig Castilla B. Early Chars, M.D.   PROCEDURES PERFORMED:  1.  Left heart catheterization.  2.  Selective coronary angiography.   HISTORY OF PRESENT ILLNESS:  Chris Lucas is a 60 year old man who has a  history of hypertension, who presents with discomfort in his chest.  He  underwent an echocardiogram which showed normal LV function.  He underwent  an exercise Cardiolite which showed no significant evidence of ischemia, but  a depression in his LV systolic function with exercise concerning for  balanced ischemia and he was referred for heart catheterization.   DETAILS OF THE PROCEDURE:  The patient was brought to the cardiac  catheterization laboratory in the fasting state.  There he was prepped and  draped in the usual sterile manner.  Local anesthetic was obtained over the  right groin using 1% lidocaine without epinephrine.  The right femoral  artery was cannulated using the modified Seldinger technique with a 4 French  10 cm sheath and left heart catheterization was performed using a 4 [place]  Judkins #4 left and a 4 [place] Champ catheter.  The left coronary catheter  seated the left main coronary artery.  The Champ catheter seated the right  coronary artery.  Please note that on initial injection of the left main  coronary artery there was significant hypotension associated with the  infusion of dye which was felt to be consistent with a contrast reaction.  He was treated with IV Benadryl and IV steroids, as well as IV Pepcid and  had resolution of his hypotension with fluid resuscitation.  He did not lose  consciousness and was  asymptomatic.  We completed the coronary angiogram and  then the catheters were removed.  The patient was moved back to the  cardiology holding area where the femoral artery sheath was removed.  Hemostasis was obtained using direct manual pressure.  At the conclusion of  the hold, there was no evidence of ecchymosis or hematoma formation and  distal pulses were intact.  The total fluoroscopic time was 8.2 minutes.  Total ionized contrast was 120 mL.   RESULTS:  Aortic pressure 111/82 with a mean arterial pressure of 95 mmHg.   Left ventricle pressure 114/13 with an end-diastolic pressure of 14 mmHg.   CORONARY ANGIOGRAPHY:  The left main coronary artery is a large vessel which  is angiographic normal.   The left circumflex coronary artery is a large codominant vessel which has  one small obtuse marginal, one large obtuse marginal and a trifurcating  posterolateral branching system.   There is a ramus intermedius artery which is a small caliber and is  angiographically free of disease.  Please note that the circumflex  distribution is angiographically free of disease.  The left anterior descending coronary artery is a large caliber vessel which  has one large diagonal branch.  The entire LAD distribution is free of  disease.   The right coronary artery is a large dominant vessel which has a posterior  takeoff.  It has a moderate caliber posterior descending coronary artery and  a moderate caliber posterolateral branching system which is entirely free of  disease.   Left ventriculography was not performed.   INTERPRETATION:  1.  Normal coronary arteries.  2.  Severe contrast reaction.      JH/MEDQ  D:  07/13/2004  T:  07/13/2004  Job:  [id number]   cc:   Dorthula Rue. Early Chars, MD  Fax: [phone]

## 2010-08-25 NOTE — Procedures (Signed)
NAMEJAQUAVIAN, Chris Lucas             ACCOUNT NO.:  [id number]   MEDICAL RECORD NO.:  [id number]           PATIENT TYPE:   LOCATION:                                 FACILITY:   PHYSICIAN:  [place] Bing, M.D.  DATE OF BIRTH:  1951/03/03   DATE OF PROCEDURE:  06/23/2004  DATE OF DISCHARGE:                                  ECHOCARDIOGRAM   REFERRING:  Dr. Dorethea Clan and Dr. Early Chars.   CLINICAL DATA:  A 60 year old gentleman with chest pain and hypertension.   M-MODE:  Aorta 4.0, left atrium 3.5, septum 2.0, posterior wall 1.2, LV  diastole 3.0, LV systole 2.3.   1.  Technically adequate echocardiographic study.  2.  Normal left atrium, right atrium and right ventricle.  3.  Mild to moderate aortic valvular sclerosis; mild dilatation of the      aortic root.  4.  Normal mitral valve; mild annular calcification.  5.  Normal tricuspid and pulmonic valve; normal proximal pulmonary artery.  6.  Normal  IVC.  7.  Normal left ventricular size; mild hypertrophy with proximal septal      thickening; normal regional and global capsule LV systolic function.      RR/MEDQ  D:  06/23/2004  T:  06/23/2004  Job:  [id number]

## 2011-01-12 LAB — POCT CARDIAC MARKERS
CKMB, poc: <1 — ABNORMAL LOW
Myoglobin, poc: 76.6
Operator id: 267321
Troponin i, poc: <0.05

## 2011-01-12 LAB — DIFFERENTIAL
Basophils Absolute: 0
Basophils Relative: 0
Eosinophils Absolute: 0.1 — ABNORMAL LOW
Eosinophils Relative: 1
Lymphocytes Relative: 7 — ABNORMAL LOW
Lymphs Abs: 0.8
Monocytes Absolute: 0.5
Monocytes Relative: 5
Neutro Abs: 9 — ABNORMAL HIGH
Neutrophils Relative %: 87 — ABNORMAL HIGH

## 2011-01-12 LAB — BASIC METABOLIC PANEL
BUN: 12
CO2: 25
Calcium: 9.4
Chloride: 104
Creatinine, Ser: 1.13
GFR calc Af Amer: >60
GFR calc non Af Amer: >60
Glucose, Bld: 218 — ABNORMAL HIGH
Potassium: 3.7
Sodium: 138

## 2011-01-12 LAB — CBC
HCT: 42.9
Hemoglobin: 14.5
MCHC: 33.8
MCV: 92.4
Platelets: 232
RBC: 4.64
RDW: 12.5
WBC: 10.4

## 2019-07-23 ENCOUNTER — Ambulatory Visit: Payer: Self-pay

## 2019-07-30 ENCOUNTER — Ambulatory Visit: Payer: Self-pay | Attending: Internal Medicine

## 2019-07-30 DIAGNOSIS — Z23 Encounter for immunization: Secondary | ICD-10-CM

## 2019-07-30 NOTE — Progress Notes (Signed)
   Covid-19 Vaccination Clinic  Name:  Chris Lucas    MRN: [id number] DOB: 01-13-1951  07/30/2019  Mr. Chris Lucas was observed post Covid-19 immunization for 15 minutes without incident. He was provided with Vaccine Information Sheet and instruction to access the V-Safe system.   Mr. Chris Lucas was instructed to call 911 with any severe reactions post vaccine: Marland Kitchen Difficulty breathing  . Swelling of face and throat  . A fast heartbeat  . A bad rash all over body  . Dizziness and weakness   Immunizations Administered    Name Date Dose VIS Date Route   Moderna COVID-19 Vaccine 07/30/2019  8:16 AM 0.5 mL 03/2019 Intramuscular   Manufacturer: Moderna   Lot: [id number]   [place]: [id number]

## 2019-09-01 ENCOUNTER — Other Ambulatory Visit: Payer: Self-pay

## 2019-09-01 ENCOUNTER — Ambulatory Visit: Payer: Self-pay | Attending: Internal Medicine

## 2019-09-01 ENCOUNTER — Ambulatory Visit: Payer: Self-pay

## 2019-09-01 DIAGNOSIS — Z23 Encounter for immunization: Secondary | ICD-10-CM

## 2019-09-01 NOTE — Progress Notes (Signed)
   Covid-19 Vaccination Clinic  Name:  IZEAR PINE    MRN: [id number] DOB: 02/10/1951  09/01/2019  Mr. Minasyan was observed post Covid-19 immunization for 15 minutes without incident. He was provided with Vaccine Information Sheet and instruction to access the V-Safe system.   Mr. Prabhakar was instructed to call 911 with any severe reactions post vaccine: Marland Kitchen Difficulty breathing  . Swelling of face and throat  . A fast heartbeat  . A bad rash all over body  . Dizziness and weakness   Immunizations Administered    Name Date Dose VIS Date Route   Moderna COVID-19 Vaccine 09/01/2019  9:17 AM 0.5 mL 03/2019 Intramuscular   Manufacturer: Moderna   Lot: [id number]   [place]: [id number]

## 2020-02-08 ENCOUNTER — Encounter: Payer: Self-pay | Admitting: Internal Medicine

## 2020-05-11 DIAGNOSIS — I10 Essential (primary) hypertension: Secondary | ICD-10-CM | POA: Diagnosis not present

## 2020-05-11 DIAGNOSIS — E782 Mixed hyperlipidemia: Secondary | ICD-10-CM | POA: Diagnosis not present

## 2020-05-11 DIAGNOSIS — R972 Elevated prostate specific antigen [PSA]: Secondary | ICD-10-CM | POA: Diagnosis not present

## 2020-05-11 DIAGNOSIS — E1165 Type 2 diabetes mellitus with hyperglycemia: Secondary | ICD-10-CM | POA: Diagnosis not present

## 2020-05-11 DIAGNOSIS — E119 Type 2 diabetes mellitus without complications: Secondary | ICD-10-CM | POA: Diagnosis not present

## 2020-05-11 DIAGNOSIS — R944 Abnormal results of kidney function studies: Secondary | ICD-10-CM | POA: Diagnosis not present

## 2020-05-11 DIAGNOSIS — N4232 Atypical small acinar proliferation of prostate: Secondary | ICD-10-CM | POA: Diagnosis not present

## 2020-05-11 DIAGNOSIS — N182 Chronic kidney disease, stage 2 (mild): Secondary | ICD-10-CM | POA: Diagnosis not present

## 2020-05-11 DIAGNOSIS — E1169 Type 2 diabetes mellitus with other specified complication: Secondary | ICD-10-CM | POA: Diagnosis not present

## 2020-05-11 DIAGNOSIS — Z6835 Body mass index (BMI) 35.0-35.9, adult: Secondary | ICD-10-CM | POA: Diagnosis not present

## 2020-05-11 DIAGNOSIS — Z9114 Patient's other noncompliance with medication regimen: Secondary | ICD-10-CM | POA: Diagnosis not present

## 2020-05-17 ENCOUNTER — Other Ambulatory Visit: Payer: Medicare HMO

## 2020-05-17 DIAGNOSIS — Z20822 Contact with and (suspected) exposure to covid-19: Secondary | ICD-10-CM

## 2020-05-19 LAB — SARS-COV-2, NAA 2 DAY TAT

## 2020-05-19 LAB — NOVEL CORONAVIRUS, NAA: SARS-CoV-2, NAA: NOT DETECTED

## 2020-05-25 DIAGNOSIS — R972 Elevated prostate specific antigen [PSA]: Secondary | ICD-10-CM | POA: Diagnosis not present

## 2020-05-25 DIAGNOSIS — N4232 Atypical small acinar proliferation of prostate: Secondary | ICD-10-CM | POA: Diagnosis not present

## 2020-05-25 DIAGNOSIS — N400 Enlarged prostate without lower urinary tract symptoms: Secondary | ICD-10-CM | POA: Diagnosis not present

## 2020-08-10 DIAGNOSIS — I10 Essential (primary) hypertension: Secondary | ICD-10-CM | POA: Diagnosis not present

## 2020-08-10 DIAGNOSIS — E1165 Type 2 diabetes mellitus with hyperglycemia: Secondary | ICD-10-CM | POA: Diagnosis not present

## 2020-08-10 DIAGNOSIS — R972 Elevated prostate specific antigen [PSA]: Secondary | ICD-10-CM | POA: Diagnosis not present

## 2020-08-10 DIAGNOSIS — N182 Chronic kidney disease, stage 2 (mild): Secondary | ICD-10-CM | POA: Diagnosis not present

## 2020-08-10 DIAGNOSIS — E782 Mixed hyperlipidemia: Secondary | ICD-10-CM | POA: Diagnosis not present

## 2020-08-10 DIAGNOSIS — E119 Type 2 diabetes mellitus without complications: Secondary | ICD-10-CM | POA: Diagnosis not present

## 2020-08-10 DIAGNOSIS — E1169 Type 2 diabetes mellitus with other specified complication: Secondary | ICD-10-CM | POA: Diagnosis not present

## 2020-08-15 DIAGNOSIS — N1831 Chronic kidney disease, stage 3a: Secondary | ICD-10-CM | POA: Diagnosis not present

## 2020-08-15 DIAGNOSIS — R809 Proteinuria, unspecified: Secondary | ICD-10-CM | POA: Diagnosis not present

## 2020-08-15 DIAGNOSIS — I10 Essential (primary) hypertension: Secondary | ICD-10-CM | POA: Diagnosis not present

## 2020-08-15 DIAGNOSIS — E559 Vitamin D deficiency, unspecified: Secondary | ICD-10-CM | POA: Diagnosis not present

## 2020-08-15 DIAGNOSIS — R972 Elevated prostate specific antigen [PSA]: Secondary | ICD-10-CM | POA: Diagnosis not present

## 2020-08-15 DIAGNOSIS — E1165 Type 2 diabetes mellitus with hyperglycemia: Secondary | ICD-10-CM | POA: Diagnosis not present

## 2020-08-15 DIAGNOSIS — E669 Obesity, unspecified: Secondary | ICD-10-CM | POA: Diagnosis not present

## 2020-08-15 DIAGNOSIS — M5442 Lumbago with sciatica, left side: Secondary | ICD-10-CM | POA: Diagnosis not present

## 2020-11-21 DIAGNOSIS — E1165 Type 2 diabetes mellitus with hyperglycemia: Secondary | ICD-10-CM | POA: Diagnosis not present

## 2020-11-21 DIAGNOSIS — I10 Essential (primary) hypertension: Secondary | ICD-10-CM | POA: Diagnosis not present

## 2020-11-22 DIAGNOSIS — E1165 Type 2 diabetes mellitus with hyperglycemia: Secondary | ICD-10-CM | POA: Diagnosis not present

## 2020-11-22 DIAGNOSIS — I10 Essential (primary) hypertension: Secondary | ICD-10-CM | POA: Diagnosis not present

## 2020-11-22 DIAGNOSIS — N1831 Chronic kidney disease, stage 3a: Secondary | ICD-10-CM | POA: Diagnosis not present

## 2020-11-22 DIAGNOSIS — E669 Obesity, unspecified: Secondary | ICD-10-CM | POA: Diagnosis not present

## 2020-11-22 DIAGNOSIS — M5442 Lumbago with sciatica, left side: Secondary | ICD-10-CM | POA: Diagnosis not present

## 2020-11-22 DIAGNOSIS — R809 Proteinuria, unspecified: Secondary | ICD-10-CM | POA: Diagnosis not present

## 2020-11-22 DIAGNOSIS — R972 Elevated prostate specific antigen [PSA]: Secondary | ICD-10-CM | POA: Diagnosis not present

## 2020-11-22 DIAGNOSIS — Z0001 Encounter for general adult medical examination with abnormal findings: Secondary | ICD-10-CM | POA: Diagnosis not present

## 2020-11-22 DIAGNOSIS — E559 Vitamin D deficiency, unspecified: Secondary | ICD-10-CM | POA: Diagnosis not present

## 2020-12-20 DIAGNOSIS — E669 Obesity, unspecified: Secondary | ICD-10-CM | POA: Diagnosis not present

## 2020-12-20 DIAGNOSIS — I10 Essential (primary) hypertension: Secondary | ICD-10-CM | POA: Diagnosis not present

## 2020-12-20 DIAGNOSIS — N1831 Chronic kidney disease, stage 3a: Secondary | ICD-10-CM | POA: Diagnosis not present

## 2020-12-20 DIAGNOSIS — R809 Proteinuria, unspecified: Secondary | ICD-10-CM | POA: Diagnosis not present

## 2021-01-20 DIAGNOSIS — E669 Obesity, unspecified: Secondary | ICD-10-CM | POA: Diagnosis not present

## 2021-01-20 DIAGNOSIS — R252 Cramp and spasm: Secondary | ICD-10-CM | POA: Diagnosis not present

## 2021-01-20 DIAGNOSIS — R809 Proteinuria, unspecified: Secondary | ICD-10-CM | POA: Diagnosis not present

## 2021-01-20 DIAGNOSIS — N1831 Chronic kidney disease, stage 3a: Secondary | ICD-10-CM | POA: Diagnosis not present

## 2021-01-20 DIAGNOSIS — I10 Essential (primary) hypertension: Secondary | ICD-10-CM | POA: Diagnosis not present

## 2021-02-06 DIAGNOSIS — H524 Presbyopia: Secondary | ICD-10-CM | POA: Diagnosis not present

## 2021-02-13 DIAGNOSIS — Z0100 Encounter for examination of eyes and vision without abnormal findings: Secondary | ICD-10-CM | POA: Diagnosis not present

## 2021-03-06 DIAGNOSIS — E1165 Type 2 diabetes mellitus with hyperglycemia: Secondary | ICD-10-CM | POA: Diagnosis not present

## 2021-03-06 DIAGNOSIS — E782 Mixed hyperlipidemia: Secondary | ICD-10-CM | POA: Diagnosis not present

## 2021-03-09 NOTE — Progress Notes (Signed)
[facility] Note  03/13/2021     CHIEF COMPLAINT Patient presents for Retina Evaluation   HISTORY OF PRESENT ILLNESS: Chris Lucas is a 70 y.o. male who presents to the clinic today for:   HPI     Retina Evaluation   In right eye.  Associated Symptoms Floaters.  Negative for Flashes, Distortion, Pain, Redness, Glare, Fever and Weight Loss.  I, the attending physician,  performed the HPI with the patient and updated documentation appropriately.        Comments   Patient here for Retina Evaluation. Referred by  [facility]. Patient states denies having a stroke. Wasn't told anything about a stroke. Has spots that go across vision OD. 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 Borderline diabetic- diet and exercise controlled. No medicine. Allergic to contrast dye. Taking Amlodipine 10 mg 1 a day and Olmesartan 40 mg 1 a day.      Last edited by Bernarda Caffey, MD on 03/14/2021 11:03 PM.    Pt is here on the referral of [facility] in [place] for concern of a spot on the nerve, pt states he has floaters in his right eye that started in August  Referring physician: Madelin Headings, DO [address] [place],  [place] [id number]  HISTORICAL INFORMATION:   Selected notes from the MEDICAL RECORD NUMBER Referred by [facility] [place] LEE:  Ocular Hx- PMH-    CURRENT MEDICATIONS: No current outpatient medications on file. (Ophthalmic Drugs)   No current facility-administered medications for this visit. (Ophthalmic Drugs)   Current Outpatient Medications (Other)  Medication Sig   amLODipine (NORVASC) 10 MG tablet Take by mouth.   furosemide (LASIX) 20 MG tablet    olmesartan (BENICAR) 40 MG tablet Take by mouth.   No current facility-administered medications for this visit. (Other)   REVIEW OF SYSTEMS: ROS   Positive for: Cardiovascular, Eyes Negative for: Constitutional, Gastrointestinal, Neurological, Skin, Genitourinary, Musculoskeletal, HENT,  Endocrine, Respiratory, Psychiatric, Allergic/Imm, Heme/Lymph Last edited by Matthew Folks, COA on 03/13/2021  9:56 AM.     ALLERGIES No Known Allergies  PAST MEDICAL HISTORY History reviewed. No pertinent past medical history. History reviewed. No pertinent surgical history.  FAMILY HISTORY Family History  Problem Relation Age of Onset   Diabetes Mother     SOCIAL HISTORY Social History   Tobacco Use   Smoking status: Never   Smokeless tobacco: Never       OPHTHALMIC EXAM:  Base Eye Exam     Visual Acuity (Snellen - Linear)       Right Left   Dist cc 20/20 -2 20/25 +2    Correction: Glasses         Tonometry (Tonopen, 9:42 AM)       Right Left   Pressure 20 15         Pupils       Dark Light Shape React APD   Right 3 2 Round Brisk None   Left 3 2 Round Brisk None         Visual Fields (Counting fingers)       Left Right    Full Full         Extraocular Movement       Right Left    Full, Ortho Full, Ortho         Neuro/Psych     Oriented x3: Yes   Mood/Affect: Normal         Dilation  Both eyes: 1.0% Mydriacyl, 2.5% Phenylephrine @ 9:42 AM           Slit Lamp and Fundus Exam     Slit Lamp Exam       Right Left   Lids/Lashes Dermatochalasis - upper lid Dermatochalasis - upper lid   Conjunctiva/Sclera White and quiet White and quiet   Cornea trace PEE, mild arcus trace PEE, mild arcus   Anterior Chamber Deep and quiet Deep and quiet   Iris Round and dilated Round and dilated   Lens 2+ Nuclear sclerosis, 2+ Cortical cataract 2+ Nuclear sclerosis, 2+ Cortical cataract   Anterior Vitreous Vitreous syneresis, Posterior vitreous detachment Vitreous syneresis         Fundus Exam       Right Left   Disc Inferior elevated pigmented lesion -- melanocytoma, no surrounding edema or heme Pink and Sharp, mild PPP   C/D Ratio  0.3   Macula Flat, good foveal reflex, mild RPE mottling, No heme or edema Flat, good foveal  reflex, mild RPE mottling and clumping, No heme or edema   Vessels mild attenuation, mild tortuousity attenuated   Periphery Attached, No heme Attached, No heme           Refraction     Wearing Rx       Sphere Cylinder Axis Add   Right +1.25 +0.75 046 +2.25   Left +0.75 +2.00 171 +2.25    Age: 14 wks         Manifest Refraction       Sphere Cylinder Axis Dist VA   Right +1.50 +0.75 050 20/20-   Left +1.00 +1.75 170 20/20-2            IMAGING AND PROCEDURES  Imaging and Procedures for 03/13/2021  OCT, Retina - OU - Both Eyes       Right Eye Quality was good. Central Foveal Thickness: 225. Progression has no prior data. Findings include normal foveal contour, no IRF, no SRF (Severely elevated PED/choroidal lesion inferior disc).   Left Eye Central Foveal Thickness: 231. Progression has no prior data. Findings include normal foveal contour, no IRF, no SRF (Partial PVD).   Notes *Images captured and stored on drive  Diagnosis / Impression:  NFP, no IRF/SRF OU OD: Severely elevated PED/choroidal lesion inferior disc  Clinical management:  See below  Abbreviations: NFP - Normal foveal profile. CME - cystoid macular edema. PED - pigment epithelial detachment. IRF - intraretinal fluid. SRF - subretinal fluid. EZ - ellipsoid zone. ERM - epiretinal membrane. ORA - outer retinal atrophy. ORT - outer retinal tubulation. SRHM - subretinal hyper-reflective material. IRHM - intraretinal hyper-reflective material      Fluorescein Angiography Optos (Transit OD)       Right Eye Progression has no prior data. Early phase findings include blockage (Focal hypofluorescence of inferior disc). Mid/Late phase findings include blockage (Focal hypofluorescence of inferior disc; no leakage or hyperfluorescence).   Left Eye Progression has no prior data. Early phase findings include normal observations. Mid/Late phase findings include normal observations.   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 Retinal edema  H35.81 OCT, Retina - OU - Both Eyes    3.  Essential hypertension  I10     4. Hypertensive retinopathy of both eyes  H35.033 Fluorescein Angiography Optos (Transit OD)    5. Combined forms of age-related cataract of both eyes  H25.813       1,2. Melanocytoma of optic nerve head OD  - elevated pigmented lesion overlying inferior optic disc OD  - lesion found incidentally via Optos image / teleoptometric visit  - pt asymptomatic with BCVA 20/20  - OCT without surrounding IRF or SRF  - FA 12.5.22 with focal blockage at lesion; no leakage, CNV or hyperfluorescence  - discussed findings, prognosis  - no retinal or ophthalmic interventions indicated or recommended -- monitor  - f/u 3 months, DFE, OCT  3,4. Hypertensive retinopathy OU - discussed importance of tight BP control - monitor  5. Mixed Cataract OU - The symptoms of cataract, surgical options, and treatments and risks were discussed with patient. - discussed diagnosis and progression - not yet visually significant - monitor for now   Ophthalmic Meds Ordered this visit:  No orders of the defined types were placed in this encounter.    Return in 3 months (on 06/11/2021) for f/u melanocytoma of optic disc OD, Dilated Exam, OCT.  There are no Patient Instructions on file for this visit.   Explained the diagnoses, plan, and follow up with the patient and they expressed understanding.  Patient expressed understanding of the importance of proper follow up care.   This document serves as a record of services personally performed by Gardiner Sleeper, MD, PhD. It was created on their behalf by Roselee Nova, COMT. The creation of this record is  the provider's dictation and/or activities during the visit.  Electronically signed by: Roselee Nova, COMT 03/14/21 11:13 PM  This document serves as a record of services personally performed by Gardiner Sleeper, MD, PhD. It was created on their behalf by San Jetty. Owens Shark, OA an ophthalmic technician. The creation of this record is the provider's dictation and/or activities during the visit.    Electronically signed by: San Jetty. Owens Shark, [place] 12.05.2022 11:13 PM   Gardiner Sleeper, M.D., Ph.D. Diseases & Surgery of the Retina and Vitreous [facility]  I have reviewed the above documentation for accuracy and completeness, and I agree with the above. Gardiner Sleeper, M.D., Ph.D. 03/14/21 11:13 PM   Abbreviations: M myopia (nearsighted); A astigmatism; H hyperopia (farsighted); P presbyopia; Mrx spectacle prescription;  CTL contact lenses; OD right eye; OS left eye; OU both eyes  XT exotropia; ET esotropia; PEK punctate epithelial keratitis; PEE punctate epithelial erosions; DES dry eye syndrome; MGD meibomian gland dysfunction; ATs artificial tears; PFAT's preservative free artificial tears; [place] nuclear sclerotic cataract; PSC posterior subcapsular cataract; ERM epi-retinal membrane; PVD posterior vitreous detachment; RD retinal detachment; DM diabetes mellitus; DR diabetic retinopathy; NPDR non-proliferative diabetic retinopathy; PDR proliferative diabetic retinopathy; CSME clinically significant macular edema; DME diabetic macular edema; dbh dot blot hemorrhages; CWS cotton wool spot; POAG primary open angle glaucoma; C/D cup-to-disc ratio; HVF humphrey visual field; GVF goldmann visual field; OCT optical coherence tomography; IOP intraocular pressure; BRVO Branch retinal vein occlusion; CRVO central retinal vein occlusion; CRAO central retinal artery occlusion; BRAO branch retinal artery occlusion; RT retinal tear; SB scleral buckle; PPV pars plana vitrectomy; VH Vitreous  hemorrhage; PRP panretinal laser photocoagulation; IVK intravitreal kenalog; VMT vitreomacular traction; MH Macular hole;  NVD neovascularization of the disc; NVE neovascularization elsewhere; AREDS age related eye disease study; ARMD age related macular  degeneration; POAG primary open angle glaucoma; EBMD epithelial/anterior basement membrane dystrophy; ACIOL anterior chamber intraocular lens; IOL intraocular lens; PCIOL posterior chamber intraocular lens; Phaco/IOL phacoemulsification with intraocular lens placement; [place] photorefractive keratectomy; LASIK laser assisted in situ keratomileusis; HTN hypertension; DM diabetes mellitus; COPD chronic obstructive pulmonary disease

## 2021-03-10 DIAGNOSIS — E669 Obesity, unspecified: Secondary | ICD-10-CM | POA: Diagnosis not present

## 2021-03-10 DIAGNOSIS — E1165 Type 2 diabetes mellitus with hyperglycemia: Secondary | ICD-10-CM | POA: Diagnosis not present

## 2021-03-10 DIAGNOSIS — N1831 Chronic kidney disease, stage 3a: Secondary | ICD-10-CM | POA: Diagnosis not present

## 2021-03-10 DIAGNOSIS — E559 Vitamin D deficiency, unspecified: Secondary | ICD-10-CM | POA: Diagnosis not present

## 2021-03-10 DIAGNOSIS — U099 Post covid-19 condition, unspecified: Secondary | ICD-10-CM | POA: Diagnosis not present

## 2021-03-10 DIAGNOSIS — R809 Proteinuria, unspecified: Secondary | ICD-10-CM | POA: Diagnosis not present

## 2021-03-10 DIAGNOSIS — E782 Mixed hyperlipidemia: Secondary | ICD-10-CM | POA: Diagnosis not present

## 2021-03-10 DIAGNOSIS — R972 Elevated prostate specific antigen [PSA]: Secondary | ICD-10-CM | POA: Diagnosis not present

## 2021-03-10 DIAGNOSIS — I10 Essential (primary) hypertension: Secondary | ICD-10-CM | POA: Diagnosis not present

## 2021-03-13 ENCOUNTER — Encounter (INDEPENDENT_AMBULATORY_CARE_PROVIDER_SITE_OTHER): Payer: Self-pay | Admitting: Ophthalmology

## 2021-03-13 ENCOUNTER — Other Ambulatory Visit: Payer: Self-pay

## 2021-03-13 ENCOUNTER — Ambulatory Visit (INDEPENDENT_AMBULATORY_CARE_PROVIDER_SITE_OTHER): Payer: Medicare HMO | Admitting: Ophthalmology

## 2021-03-13 DIAGNOSIS — D333 Benign neoplasm of cranial nerves: Secondary | ICD-10-CM

## 2021-03-13 DIAGNOSIS — I10 Essential (primary) hypertension: Secondary | ICD-10-CM | POA: Diagnosis not present

## 2021-03-13 DIAGNOSIS — H35033 Hypertensive retinopathy, bilateral: Secondary | ICD-10-CM | POA: Diagnosis not present

## 2021-03-13 DIAGNOSIS — H3581 Retinal edema: Secondary | ICD-10-CM

## 2021-03-13 DIAGNOSIS — H25813 Combined forms of age-related cataract, bilateral: Secondary | ICD-10-CM

## 2021-06-15 NOTE — Progress Notes (Shared)
?[facility] Note ? ?06/19/2021 ? ?  ? ?CHIEF COMPLAINT ?Patient presents for No chief complaint on file. ? ? ? ?HISTORY OF PRESENT ILLNESS: ?Chris Lucas is a 71 y.o. male who presents to the clinic today for:  ? ? ?Pt is here on the referral of [facility] in [place] for concern of a spot on the nerve, pt states he has floaters in his right eye that started in August ? ?Referring physician: ?No referring provider defined for this encounter. ? ?HISTORICAL INFORMATION:  ? ?Selected notes from the [place] ?Referred by [facility] [place] ?LEE:  ?Ocular Hx- ?PMH- ?  ? ?CURRENT MEDICATIONS: ?No current outpatient medications on file. (Ophthalmic Drugs)  ? ?No current facility-administered medications for this visit. (Ophthalmic Drugs)  ? ?Current Outpatient Medications (Other)  ?Medication Sig  ? amLODipine (NORVASC) 10 MG tablet Take by mouth.  ? furosemide (LASIX) 20 MG tablet   ? olmesartan (BENICAR) 40 MG tablet Take by mouth.  ? ?No current facility-administered medications for this visit. (Other)  ? ?REVIEW OF SYSTEMS: ? ? ?ALLERGIES ?No Known Allergies ? ?PAST MEDICAL HISTORY ?No past medical history on file. ?No past surgical history on file. ? ?FAMILY HISTORY ?Family History  ?Problem Relation Age of Onset  ? Diabetes Mother   ? ? ?SOCIAL HISTORY ?Social History  ? ?Tobacco Use  ? Smoking status: Never  ? Smokeless tobacco: Never  ?  ? ?  ?OPHTHALMIC EXAM: ? ?Not recorded ?  ? ? ?IMAGING AND PROCEDURES  ?Imaging and Procedures for 06/19/2021 ? ? ?  ?  ? ?  ?ASSESSMENT/PLAN: ? ?  ICD-10-CM   ?1. Melanocytoma of optic nerve head ([place])  D33.3   ?  ?2. Essential hypertension  I10   ?  ?3. Hypertensive retinopathy of both eyes  H35.033   ?  ?4. Combined forms of age-related cataract of both eyes  H25.813   ?  ? ? ?1. Melanocytoma of optic nerve head OD ? - elevated pigmented lesion overlying inferior optic disc OD ? - lesion found incidentally via Optos image /  teleoptometric visit ? - pt asymptomatic with BCVA 20/20 ? - OCT without surrounding IRF or SRF ? - FA 12.5.22 with focal blockage at lesion; no leakage, CNV or hyperfluorescence ? - discussed findings, prognosis ? - no retinal or ophthalmic interventions indicated or recommended -- monitor ? - f/u 3 months, DFE, OCT ? ?2,3. Hypertensive retinopathy OU ?- discussed importance of tight BP control ?- monitor ? ?4. Mixed Cataract OU ?- The symptoms of cataract, surgical options, and treatments and risks were discussed with patient. ?- discussed diagnosis and progression ?- not yet visually significant ?- monitor for now ? ? ?Ophthalmic Meds Ordered this visit:  ?No orders of the defined types were placed in this encounter. ? ?  ? ?No follow-ups on file. ? ?There are no Patient Instructions on file for this visit. ? ? ?Explained the diagnoses, plan, and follow up with the patient and they expressed understanding.  Patient expressed understanding of the importance of proper follow up care.  ? ?This document serves as a record of services personally performed by Gardiner Sleeper, MD, PhD. It was created on their behalf by San Jetty. Owens Shark, OA an ophthalmic technician. The creation of this record is the provider's dictation and/or activities during the visit.   ? ?Electronically signed by: San Jetty. Owens Shark, [place] 03.09.2023 8:24 AM ? ? ? ?Sharyon Cable  Coralyn Pear, M.D., Ph.D. ?Diseases & Surgery of the Retina and Vitreous ?[place] ? ? ? ? ?Abbreviations: ?M myopia (nearsighted); A astigmatism; H hyperopia (farsighted); P presbyopia; Mrx spectacle prescription;  CTL contact lenses; OD right eye; OS left eye; OU both eyes  XT exotropia; ET esotropia; PEK punctate epithelial keratitis; PEE punctate epithelial erosions; DES dry eye syndrome; MGD meibomian gland dysfunction; ATs artificial tears; PFAT's preservative free artificial tears; [place] nuclear sclerotic cataract; PSC posterior subcapsular cataract; ERM  epi-retinal membrane; PVD posterior vitreous detachment; RD retinal detachment; DM diabetes mellitus; DR diabetic retinopathy; NPDR non-proliferative diabetic retinopathy; PDR proliferative diabetic retinopathy; CSME clinically significant macular edema; DME diabetic macular edema; dbh dot blot hemorrhages; CWS cotton wool spot; POAG primary open angle glaucoma; C/D cup-to-disc ratio; HVF humphrey visual field; GVF goldmann visual field; OCT optical coherence tomography; IOP intraocular pressure; BRVO Branch retinal vein occlusion; CRVO central retinal vein occlusion; CRAO central retinal artery occlusion; BRAO branch retinal artery occlusion; RT retinal tear; SB scleral buckle; PPV pars plana vitrectomy; VH Vitreous hemorrhage; PRP panretinal laser photocoagulation; IVK intravitreal kenalog; VMT vitreomacular traction; MH Macular hole;  NVD neovascularization of the disc; NVE neovascularization elsewhere; AREDS age related eye disease study; ARMD age related macular degeneration; POAG primary open angle glaucoma; EBMD epithelial/anterior basement membrane dystrophy; ACIOL anterior chamber intraocular lens; IOL intraocular lens; PCIOL posterior chamber intraocular lens; Phaco/IOL phacoemulsification with intraocular lens placement; [place] photorefractive keratectomy; LASIK laser assisted in situ keratomileusis; HTN hypertension; DM diabetes mellitus; COPD chronic obstructive pulmonary disease ? ?

## 2021-06-19 ENCOUNTER — Encounter (INDEPENDENT_AMBULATORY_CARE_PROVIDER_SITE_OTHER): Payer: Self-pay

## 2021-06-19 ENCOUNTER — Encounter (INDEPENDENT_AMBULATORY_CARE_PROVIDER_SITE_OTHER): Payer: Medicare HMO | Admitting: Ophthalmology

## 2021-06-19 DIAGNOSIS — H25813 Combined forms of age-related cataract, bilateral: Secondary | ICD-10-CM

## 2021-06-19 DIAGNOSIS — H35033 Hypertensive retinopathy, bilateral: Secondary | ICD-10-CM

## 2021-06-19 DIAGNOSIS — D333 Benign neoplasm of cranial nerves: Secondary | ICD-10-CM

## 2021-06-19 DIAGNOSIS — I10 Essential (primary) hypertension: Secondary | ICD-10-CM

## 2021-07-06 ENCOUNTER — Encounter (HOSPITAL_COMMUNITY): Payer: Self-pay | Admitting: Emergency Medicine

## 2021-07-06 ENCOUNTER — Observation Stay (HOSPITAL_COMMUNITY)
Admission: EM | Admit: 2021-07-06 | Discharge: 2021-07-07 | Disposition: A | Discharge status: Home / Self Care | Payer: Medicare HMO | Attending: Family Medicine | Admitting: Family Medicine

## 2021-07-06 ENCOUNTER — Observation Stay (HOSPITAL_COMMUNITY): Payer: Medicare HMO

## 2021-07-06 ENCOUNTER — Ambulatory Visit
Admission: EM | Admit: 2021-07-06 | Discharge: 2021-07-06 | Disposition: A | Discharge status: Nursing Home (Includes ICF) | Payer: Medicare HMO

## 2021-07-06 ENCOUNTER — Emergency Department (HOSPITAL_COMMUNITY): Payer: Medicare HMO

## 2021-07-06 ENCOUNTER — Other Ambulatory Visit: Payer: Self-pay

## 2021-07-06 ENCOUNTER — Encounter: Payer: Self-pay | Admitting: Emergency Medicine

## 2021-07-06 DIAGNOSIS — R5383 Other fatigue: Secondary | ICD-10-CM

## 2021-07-06 DIAGNOSIS — R079 Chest pain, unspecified: Secondary | ICD-10-CM | POA: Diagnosis not present

## 2021-07-06 DIAGNOSIS — R55 Syncope and collapse: Secondary | ICD-10-CM

## 2021-07-06 DIAGNOSIS — I129 Hypertensive chronic kidney disease with stage 1 through stage 4 chronic kidney disease, or unspecified chronic kidney disease: Secondary | ICD-10-CM | POA: Diagnosis not present

## 2021-07-06 DIAGNOSIS — E785 Hyperlipidemia, unspecified: Secondary | ICD-10-CM | POA: Diagnosis present

## 2021-07-06 DIAGNOSIS — R2689 Other abnormalities of gait and mobility: Secondary | ICD-10-CM | POA: Insufficient documentation

## 2021-07-06 DIAGNOSIS — I6782 Cerebral ischemia: Secondary | ICD-10-CM | POA: Diagnosis not present

## 2021-07-06 DIAGNOSIS — R531 Weakness: Secondary | ICD-10-CM

## 2021-07-06 DIAGNOSIS — N179 Acute kidney failure, unspecified: Secondary | ICD-10-CM | POA: Diagnosis not present

## 2021-07-06 DIAGNOSIS — N182 Chronic kidney disease, stage 2 (mild): Secondary | ICD-10-CM | POA: Diagnosis not present

## 2021-07-06 DIAGNOSIS — I639 Cerebral infarction, unspecified: Secondary | ICD-10-CM

## 2021-07-06 DIAGNOSIS — D3A00 Benign carcinoid tumor of unspecified site: Secondary | ICD-10-CM

## 2021-07-06 DIAGNOSIS — I4892 Unspecified atrial flutter: Secondary | ICD-10-CM | POA: Diagnosis not present

## 2021-07-06 DIAGNOSIS — I10 Essential (primary) hypertension: Secondary | ICD-10-CM

## 2021-07-06 DIAGNOSIS — I712 Thoracic aortic aneurysm, without rupture, unspecified: Secondary | ICD-10-CM

## 2021-07-06 DIAGNOSIS — Z79899 Other long term (current) drug therapy: Secondary | ICD-10-CM | POA: Diagnosis not present

## 2021-07-06 DIAGNOSIS — I7121 Aneurysm of the ascending aorta, without rupture: Secondary | ICD-10-CM | POA: Diagnosis not present

## 2021-07-06 DIAGNOSIS — R918 Other nonspecific abnormal finding of lung field: Secondary | ICD-10-CM | POA: Insufficient documentation

## 2021-07-06 DIAGNOSIS — J9811 Atelectasis: Secondary | ICD-10-CM | POA: Diagnosis not present

## 2021-07-06 DIAGNOSIS — R110 Nausea: Secondary | ICD-10-CM | POA: Diagnosis not present

## 2021-07-06 DIAGNOSIS — I619 Nontraumatic intracerebral hemorrhage, unspecified: Secondary | ICD-10-CM | POA: Diagnosis not present

## 2021-07-06 DIAGNOSIS — R7303 Prediabetes: Secondary | ICD-10-CM | POA: Insufficient documentation

## 2021-07-06 DIAGNOSIS — R109 Unspecified abdominal pain: Secondary | ICD-10-CM | POA: Diagnosis not present

## 2021-07-06 DIAGNOSIS — I4891 Unspecified atrial fibrillation: Secondary | ICD-10-CM | POA: Diagnosis not present

## 2021-07-06 HISTORY — DX: Cerebral infarction, unspecified: I63.9

## 2021-07-06 HISTORY — DX: Essential (primary) hypertension: I10

## 2021-07-06 HISTORY — DX: Unspecified atrial flutter: I48.92

## 2021-07-06 LAB — URINALYSIS, ROUTINE W REFLEX MICROSCOPIC
Bacteria, UA: NONE SEEN
Bilirubin Urine: NEGATIVE
Glucose, UA: 150 mg/dL — AB
Hgb urine dipstick: NEGATIVE
Ketones, ur: 80 mg/dL — AB
Leukocytes,Ua: NEGATIVE
Nitrite: NEGATIVE
Protein, ur: 100 mg/dL — AB
Specific Gravity, Urine: 1.021 (ref 1.005–1.030)
pH: 7 (ref 5.0–8.0)

## 2021-07-06 LAB — BASIC METABOLIC PANEL
Anion gap: 12 (ref 5–15)
BUN: 24 mg/dL — ABNORMAL HIGH (ref 8–23)
CO2: 24 mmol/L (ref 22–32)
Calcium: 9.3 mg/dL (ref 8.9–10.3)
Chloride: 97 mmol/L — ABNORMAL LOW (ref 98–111)
Creatinine, Ser: 1.56 mg/dL — ABNORMAL HIGH (ref 0.61–1.24)
GFR, Estimated: 48 mL/min — ABNORMAL LOW (ref >60)
Glucose, Bld: 217 mg/dL — ABNORMAL HIGH (ref 70–99)
Potassium: 4 mmol/L (ref 3.5–5.1)
Sodium: 133 mmol/L — ABNORMAL LOW (ref 135–145)

## 2021-07-06 LAB — CBC
HCT: 45.7 % (ref 39.0–52.0)
Hemoglobin: 15.4 g/dL (ref 13.0–17.0)
MCH: 31.8 pg (ref 26.0–34.0)
MCHC: 33.7 g/dL (ref 30.0–36.0)
MCV: 94.4 fL (ref 80.0–100.0)
Platelets: 231 10*3/uL (ref 150–400)
RBC: 4.84 MIL/uL (ref 4.22–5.81)
RDW: 12.5 % (ref 11.5–15.5)
WBC: 5.7 10*3/uL (ref 4.0–10.5)
nRBC: 0 % (ref 0.0–0.2)

## 2021-07-06 LAB — MAGNESIUM: Magnesium: 2.4 mg/dL (ref 1.7–2.4)

## 2021-07-06 MED ORDER — GADOBUTROL 1 MMOL/ML IV SOLN: 7.0000 mL | Freq: Once | INTRAVENOUS | Status: DC | PRN

## 2021-07-06 MED ORDER — ACETAMINOPHEN 650 MG RE SUPP: 650.0000 mg | Freq: Four times a day (QID) | RECTAL | Status: DC | PRN

## 2021-07-06 MED ORDER — SODIUM CHLORIDE 0.9 % IV SOLN: INTRAVENOUS | Status: DC

## 2021-07-06 MED ORDER — ENOXAPARIN SODIUM 80 MG/0.8ML IJ SOSY
1.0000 mg/kg | PREFILLED_SYRINGE | Freq: Two times a day (BID) | INTRAMUSCULAR | Status: DC
  Administered 2021-07-06 – 2021-07-07 (×2): 70 mg via SUBCUTANEOUS
  Filled 2021-07-06 (×2): qty 0.8

## 2021-07-06 MED ORDER — GADOBUTROL 1 MMOL/ML IV SOLN
10.0000 mL | Freq: Once | INTRAVENOUS | Status: AC | PRN
  Administered 2021-07-06: 10 mL via INTRAVENOUS

## 2021-07-06 MED ORDER — STROKE: EARLY STAGES OF RECOVERY BOOK: Freq: Once | Status: AC

## 2021-07-06 MED ORDER — ASPIRIN EC 81 MG PO TBEC: 81.0000 mg | DELAYED_RELEASE_TABLET | Freq: Every day | ORAL | Status: DC

## 2021-07-06 MED ORDER — AMLODIPINE BESYLATE 5 MG PO TABS
10.0000 mg | ORAL_TABLET | Freq: Every day | ORAL | Status: DC
  Administered 2021-07-06: 10 mg via ORAL
  Filled 2021-07-06: qty 2

## 2021-07-06 MED ORDER — ACETAMINOPHEN 325 MG PO TABS: 650.0000 mg | ORAL_TABLET | Freq: Four times a day (QID) | ORAL | Status: DC | PRN

## 2021-07-06 MED ORDER — IRBESARTAN 150 MG PO TABS
75.0000 mg | ORAL_TABLET | Freq: Every day | ORAL | Status: DC
  Administered 2021-07-06: 75 mg via ORAL
  Filled 2021-07-06: qty 1

## 2021-07-06 MED ORDER — BISACODYL 5 MG PO TBEC: 5.0000 mg | DELAYED_RELEASE_TABLET | Freq: Every day | ORAL | Status: DC | PRN

## 2021-07-06 NOTE — ED Notes (Signed)
Patient transported to X-ray 

## 2021-07-06 NOTE — ED Notes (Signed)
Patient is being discharged from the Urgent Care and sent to the Emergency Department via EMS . Per PA, patient is in need of higher level of care due to syncope/abd pain/iregular cardiac rhythm. Patient is aware and verbalizes understanding of plan of care.  ?Vitals:  ? 07/06/21 1138  ?BP: 138/90  ?Pulse: 72  ?Resp: 18  ?Temp: 98.2 ?F (36.8 ?C)  ?SpO2: 98%  ?  ?

## 2021-07-06 NOTE — Progress Notes (Signed)
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  Will obtain lipid panel and A1c. ?Chris Climes MD ?

## 2021-07-06 NOTE — Progress Notes (Addendum)
ANTICOAGULATION CONSULT NOTE ? ?Pharmacy Consult for Lovenox ?Indication: atrial fibrillation ? ?Labs: ?Recent Labs  ?  07/06/21 ?1319  ?HGB 15.4  ?HCT 45.7  ?PLT 231  ?CREATININE 1.56*  ? ? ?Assessment: ?59 yom presenting with syncope likely secondary to new-onset aflutter. Also with LUL lung mass suspicious for malignancy, and Pulmonary/Oncology are consulted. Pharmacy consulted to dose Lovenox. Patient is not on anticoagulation PTA. CBC wnl. Noted SCr 1.56 on admission (no recent values in Epic but has ranged 1.09-1.41 previously per chart), CrCl >30. ? ?Goal of Therapy:  ?Anti-Xa level 0.6-1 units/ml 4hrs after LMWH dose given ?Monitor platelets by anticoagulation protocol: Yes ?  ?Plan:  ?Lovenox 70mg  (1mg /kg) [place] q12h ?Monitor CBC at least q72h, SCr, s/sx bleeding ?Plan to transition to apixaban once sure no procedures planned per MD ? ? ?Arturo Morton, PharmD, BCPS ?Clinical Pharmacist ?07/06/2021 5:40 PM ? ?

## 2021-07-06 NOTE — ED Notes (Signed)
Report given to Brandi,RN at [facility] ED. ?

## 2021-07-06 NOTE — ED Notes (Signed)
Called [facility], EMS en route. ?

## 2021-07-06 NOTE — ED Provider Notes (Signed)
?[facility] ?Provider Note ? ? ?CSN: [id number] ?Arrival date & time: 07/06/21  1231 ? ?  ? ?History ? ?Chief Complaint  ?Patient presents with  ? Weakness  ? ? ?Chris Lucas is a 71 y.o. male. ? ?Patient states that 2 days ago he has been having fevers and chills and he had a syncopal episode for a few seconds.  He was seen over at the urgent care today and noticed to have atrial flutter.  Patient has a past medical history of hypertension ? ?The history is provided by the patient and medical records. No language interpreter was used.  ?Weakness ?Severity:  Moderate ?Onset quality:  Sudden ?Timing:  Constant ?Progression:  Worsening ?Chronicity:  New ?Context: not alcohol use   ?Relieved by:  Nothing ?Worsened by:  Nothing ?Ineffective treatments:  None tried ?Associated symptoms: no abdominal pain, no chest pain, no cough, no diarrhea, no frequency, no headaches and no seizures   ?Risk factors: no anemia   ? ?  ? ?Home Medications ?Prior to Admission medications   ?Medication Sig Start Date End Date Taking? Authorizing Provider  ?amLODipine (NORVASC) 10 MG tablet Take 10 mg by mouth daily. 12/20/20  Yes [provider]  ?anti-nausea (EMETROL) solution Take 10 mLs by mouth every 15 (fifteen) minutes as needed for nausea or vomiting.   Yes [provider]  ?bisacodyl 5 MG EC tablet Take 5 mg by mouth daily as needed for moderate constipation.   Yes [provider]  ?furosemide (LASIX) 20 MG tablet Take 20 mg by mouth daily as needed for fluid. 11/22/20  Yes [provider]  ?olmesartan (BENICAR) 40 MG tablet Take 40 mg by mouth daily. 11/05/20  Yes [provider]  ?   ? ?Allergies    ?Ivp dye [iodinated contrast media]   ? ?Review of Systems   ?Review of Systems  ?Constitutional:  Negative for appetite change and fatigue.  ?HENT:  Negative for congestion, ear discharge and sinus pressure.   ?Eyes:  Negative for discharge.  ?Respiratory:  Negative  for cough.   ?Cardiovascular:  Negative for chest pain.  ?Gastrointestinal:  Negative for abdominal pain and diarrhea.  ?Genitourinary:  Negative for frequency and hematuria.  ?Musculoskeletal:  Negative for back pain.  ?Skin:  Negative for rash.  ?Neurological:  Positive for weakness. Negative for seizures and headaches.  ?Psychiatric/Behavioral:  Negative for hallucinations.   ? ?Physical Exam ?Updated Vital Signs ?BP (!) 146/86   Pulse 66   Temp 97.7 ?F (36.5 ?C) (Oral)   Resp 20   Ht 6\' 1"  (1.854 m)   Wt 71.2 kg   SpO2 97%   BMI 20.71 kg/m?  ?Physical Exam ?Vitals and nursing note reviewed.  ?Constitutional:   ?   Appearance: He is well-developed.  ?HENT:  ?   Head: Normocephalic.  ?   Nose: Nose normal.  ?Eyes:  ?   General: No scleral icterus. ?   Conjunctiva/sclera: Conjunctivae normal.  ?Neck:  ?   Thyroid: No thyromegaly.  ?Cardiovascular:  ?   Rate and Rhythm: Normal rate. Rhythm irregular.  ?   Heart sounds: No murmur heard. ?  No friction rub. No gallop.  ?Pulmonary:  ?   Breath sounds: No stridor. No wheezing or rales.  ?Chest:  ?   Chest wall: No tenderness.  ?Abdominal:  ?   General: There is no distension.  ?   Tenderness: There is no abdominal tenderness. There is no rebound.  ?Musculoskeletal:     ?  General: Normal range of motion.  ?   Cervical back: Neck supple.  ?Lymphadenopathy:  ?   Cervical: No cervical adenopathy.  ?Skin: ?   Findings: No erythema or rash.  ?Neurological:  ?   Mental Status: He is alert and oriented to person, place, and time.  ?   Motor: No abnormal muscle tone.  ?   Coordination: Coordination normal.  ?Psychiatric:     ?   Behavior: Behavior normal.  ? ? ?ED Results / Procedures / Treatments   ?Labs ?(all labs ordered are listed, but only abnormal results are displayed) ?Labs Reviewed  ?BASIC METABOLIC PANEL - Abnormal; Notable for the following components:  ?    Result Value  ? Sodium 133 (*)   ? Chloride 97 (*)   ? Glucose, Bld 217 (*)   ? BUN 24 (*)   ?  Creatinine, Ser 1.56 (*)   ? GFR, Estimated 48 (*)   ? All other components within normal limits  ?URINALYSIS, ROUTINE W REFLEX MICROSCOPIC - Abnormal; Notable for the following components:  ? Glucose, UA 150 (*)   ? Ketones, ur 80 (*)   ? Protein, ur 100 (*)   ? All other components within normal limits  ?CBC  ? ? ?EKG ?EKG Interpretation ? ?Date/Time:  Thursday July 06 2021 12:41:25 EDT ?Ventricular Rate:  68 ?PR Interval:    ?QRS Duration: 106 ?QT Interval:  499 ?QTC Calculation: 531 ?R Axis:   64 ?Text Interpretation: Atrial flutter with predominant 4:1 AV block Repol abnrm, global ischemia, diffuse leads Prolonged QT interval Confirmed by Milton Ferguson [phone]) on 07/06/2021 2:08:00 PM ? ?Radiology ?DG Chest 2 View ? ?Result Date: 07/06/2021 ?CLINICAL DATA:  Abdominal pain, nausea. EXAM: CHEST - 2 VIEW COMPARISON:  March 26, 2007. FINDINGS: Stable cardiomediastinal silhouette. Right lung is clear. Stable elevated left hemidiaphragm is noted with mild left basilar subsegmental atelectasis. There is a rounded density in the left lower lobe concerning for possible neoplasm. Bony thorax is unremarkable. IMPRESSION: Elevated left hemidiaphragm is noted with mild left basilar subsegmental atelectasis. Rounded nodular density seen in left lung base concerning for neoplasm; CT scan of the chest with intravenous contrast is recommended for further evaluation. Electronically Signed   By: Marijo Conception M.D.   On: 07/06/2021 13:41  ? ?CT Chest Wo Contrast ? ?Result Date: 07/06/2021 ?CLINICAL DATA:  Lung nodule, abnormal chest radiograph, generalized weakness, nausea, decreased appetite, history hypertension EXAM: CT CHEST WITHOUT CONTRAST TECHNIQUE: Multidetector CT imaging of the chest was performed following the standard protocol without IV contrast. RADIATION DOSE REDUCTION: This exam was performed according to the departmental dose-optimization program which includes automated exposure control, adjustment of the mA  and/or kV according to patient size and/or use of iterative reconstruction technique. COMPARISON:  Chest radiograph 07/06/2021 FINDINGS: Cardiovascular: Aneurysmal dilatation ascending thoracic aorta 4.8 cm transverse image 75. Heart size normal. Trace pericardial fluid. Mediastinum/Nodes: Esophagus unremarkable. No thoracic adenopathy identified though assessment of hila is limited by lack of IV contrast. Base of cervical region normal appearance. Lungs/Pleura: Macrolobulated solid mass in LEFT upper lobe abutting the major fissure, 3.5 x 3.1 x 2.8 cm highly concerning for a pulmonary neoplasm. Bibasilar atelectasis. Tiny calcified granuloma in lingula; no follow-up imaging recommended. No additional mass or nodule. No infiltrate, pleural effusion, or pneumothorax. Upper Abdomen: Unremarkable Musculoskeletal: No acute osseous findings. IMPRESSION: Macrolobulated solid mass in LEFT upper lobe abutting the major fissure, 32 mm diameter, highly concerning for a pulmonary neoplasm; Recommend a  PET/CT or tissue sampling. These guidelines do not apply to immunocompromised patients and patients with cancer. Follow up in patients with significant comorbidities as clinically warranted. For lung cancer screening, adhere to Lung-RADS guidelines. Reference: Radiology. 2017; 284(1):228-43. Aneurysmal dilatation of ascending thoracic aorta 4.8 cm transverse; Ascending thoracic aortic aneurysm. Recommend semi-annual imaging followup by CTA or MRA and referral to cardiothoracic surgery if not already obtained. This recommendation follows 2010 ACCF/AHA/AATS/ACR/ASA/SCA/SCAI/SIR/STS/SVM Guidelines for the Diagnosis and Management of Patients With Thoracic Aortic Disease. Circulation. 2010; 121: E784-X282. Aortic aneurysm NOS (ICD10-I71.9) Bibasilar atelectasis. Electronically Signed   By: Lavonia Dana M.D.   On: 07/06/2021 15:38   ? ?Procedures ?Procedures  ? ? ?Medications Ordered in ED ?Medications - No data to display ? ?ED  Course/ Medical Decision Making/ A&P ?I spoke with cardiology Dr. Radford Pax and she stated the patient needs an echo and once the mass in his chest is evaluated thoroughly he will need to be placed on blood thinners ?

## 2021-07-06 NOTE — ED Notes (Signed)
Patient transported to CT 

## 2021-07-06 NOTE — ED Notes (Signed)
EMS at bedside

## 2021-07-06 NOTE — H&P (Signed)
? ?                                                                                                       [facility] H&P ? ? Patient Demographics:  ? ? Chris Lucas, is a 70 y.o. male  MRN: [id number]   DOB - 1950-08-20 ? ?Admit Date - 07/06/2021 ? ?Outpatient Primary MD for the patient is [name], MD ? ?Referring MD/NP/PA: Dr Roderic Palau ? ?Patient coming from: home ? ?Chief Complaint  ?Patient presents with  ? Weakness  ?  ? ? HPI:  ? ? Chris Lucas  is a 71 y.o. male, with past medical history of hypertension, hyperlipidemia, prediabetes, presents to ED for evaluation of syncope, wife at bedside assisted with the history, patient reports he was eating lunch with his wife, when he had upset stomach, felt nauseous, wife reported loss of consciousness for 10 to 30 seconds, denies any chest pain, shortness of breath, hemoptysis or lower extremity edema, he denies any focal deficits, but he does report tingling sensation in lower extremities, denies any history of smoking as well. ?- in ED patient was noted to have a flutter with 4-1 block, CT chest without contrast significant for left upper lobe lung mass suspicious for malignancy, creatinine elevated at 1.5, from baseline of 1.2, called his CT chest significant for thoracic aortic aneurysm 4.8 cm, [facility] hospitalist consulted to admit. ? ? ? Review of systems:  ? ?In addition to the HPI above,  ? ?A full 10 point Review of Systems was done, except as stated above, all other Review of Systems were negative. ? ? ?With Past History of the following :  ? ? ?Past Medical History:  ?Diagnosis Date  ? Hypertension   ?   ? ?History reviewed. No pertinent surgical history. ? ? ? Social History:  ? ?  ?Social History  ? ?Tobacco Use  ? Smoking status: Never  ? Smokeless tobacco: Never  ?Substance Use Topics  ? Alcohol use: Not Currently  ?  ? ? ? ? Family History :  ? ?  ?Family History  ?Problem Relation Age of Onset  ? Diabetes Mother   ? ? ? ? Home Medications:  ? ?Prior to  Admission medications   ?Medication Sig Start Date End Date Taking? Authorizing Provider  ?amLODipine (NORVASC) 10 MG tablet Take 10 mg by mouth daily. 12/20/20  Yes [provider]  ?anti-nausea (EMETROL) solution Take 10 mLs by mouth every 15 (fifteen) minutes as needed for nausea or vomiting.   Yes [provider]  ?bisacodyl 5 MG EC tablet Take 5 mg by mouth daily as needed for moderate constipation.   Yes [provider]  ?furosemide (LASIX) 20 MG tablet Take 20 mg by mouth daily as needed for fluid. 11/22/20  Yes [provider]  ?olmesartan (BENICAR) 40 MG tablet Take 40 mg by mouth daily. 11/05/20  Yes [provider]  ? ? ? Allergies:  ? ?  ?Allergies  ?Allergen Reactions  ? Ivp Dye [Iodinated Contrast Media]   ? ? ?  Physical Exam:  ? ?Vitals ? ?Blood pressure (!) 151/90, pulse 61, temperature 97.9 ?F (36.6 ?C), temperature source Oral, resp. rate 20, height 6\' 1"  (1.854 m), weight 71.2 kg, SpO2 93 %. ? ? ?1. General  lying in bed in NAD, ? ?2. Normal affect and insight, Not Suicidal or Homicidal, Awake Alert, Oriented X 3. ? ?3. No F.N deficits, ALL C.Nerves Intact, Strength 5/5 all 4 extremities, Sensation intact all 4 extremities, Plantars down going. ? ?4. Ears and Eyes appear Normal, Conjunctivae clear, PERRLA. Moist Oral Mucosa. ? ?5. Supple Neck, No JVD, No cervical lymphadenopathy appriciated, No Carotid Bruits. ? ?6. Symmetrical Chest wall movement, Good air movement bilaterally, CTAB. ? ?7.  Irregular, No Gallops, Rubs or Murmurs, No Parasternal Heave. ? ?8. Positive Bowel Sounds, Abdomen Soft, No tenderness, No organomegaly appriciated,No rebound -guarding or rigidity. ? ?9.  No Cyanosis, Normal Skin Turgor, No Skin Rash or Bruise. ? ?10. Good muscle tone,  joints appear normal , no effusions, Normal ROM. ? ?11. No Palpable Lymph Nodes in Neck or Axillae ? ? ? Data Review:  ? ? CBC ?Recent Labs  ?Lab 07/06/21 ?1319  ?WBC 5.7  ?HGB 15.4  ?HCT 45.7  ?PLT  231  ?MCV 94.4  ?MCH 31.8  ?MCHC 33.7  ?RDW 12.5  ? ?------------------------------------------------------------------------------------------------------------------ ? ?Chemistries  ?Recent Labs  ?Lab 07/06/21 ?1319  ?NA 133*  ?K 4.0  ?CL 97*  ?CO2 24  ?GLUCOSE 217*  ?BUN 24*  ?CREATININE 1.56*  ?CALCIUM 9.3  ? ?------------------------------------------------------------------------------------------------------------------ ?estimated creatinine clearance is 45 mL/min (A) (by C-G formula based on SCr of 1.56 mg/dL (H)). ?------------------------------------------------------------------------------------------------------------------ ?No results for input(s): TSH, T4TOTAL, T3FREE, THYROIDAB in the last 72 hours. ? ?Invalid input(s): FREET3 ? ?Coagulation profile ?No results for input(s): INR, PROTIME in the last 168 hours. ?------------------------------------------------------------------------------------------------------------------- ?No results for input(s): DDIMER in the last 72 hours. ?------------------------------------------------------------------------------------------------------------------- ? ?Cardiac Enzymes ?No results for input(s): CKMB, TROPONINI, MYOGLOBIN in the last 168 hours. ? ?Invalid input(s): CK ?------------------------------------------------------------------------------------------------------------------ ?No results found for: BNP ? ? ?--------------------------------------------------------------------------------------------------------------- ? ?Urinalysis ?   ?Component Value Date/Time  ? COLORURINE YELLOW 07/06/2021 1409  ? APPEARANCEUR CLEAR 07/06/2021 1409  ? LABSPEC 1.021 07/06/2021 1409  ? PHURINE 7.0 07/06/2021 1409  ? GLUCOSEU 150 (A) 07/06/2021 1409  ? [place] NEGATIVE 07/06/2021 1409  ? HGBUR negative 07/28/2007 1426  ? [place] NEGATIVE 07/06/2021 1409  ? KETONESUR 80 (A) 07/06/2021 1409  ? PROTEINUR 100 (A) 07/06/2021 1409  ? UROBILINOGEN 0.2 07/28/2007 1426  ?  NITRITE NEGATIVE 07/06/2021 1409  ? LEUKOCYTESUR NEGATIVE 07/06/2021 1409  ? ? ?---------------------------------------------------------------------------------------------------------------- ? ? Imaging Results:  ? ? DG Chest 2 View ? ?Result Date: 07/06/2021 ?CLINICAL DATA:  Abdominal pain, nausea. EXAM: CHEST - 2 VIEW COMPARISON:  March 26, 2007. FINDINGS: Stable cardiomediastinal silhouette. Right lung is clear. Stable elevated left hemidiaphragm is noted with mild left basilar subsegmental atelectasis. There is a rounded density in the left lower lobe concerning for possible neoplasm. Bony thorax is unremarkable. IMPRESSION: Elevated left hemidiaphragm is noted with mild left basilar subsegmental atelectasis. Rounded nodular density seen in left lung base concerning for neoplasm; CT scan of the chest with intravenous contrast is recommended for further evaluation. Electronically Signed   By: Marijo Conception M.D.   On: 07/06/2021 13:41  ? ?CT Chest Wo Contrast ? ?Result Date: 07/06/2021 ?CLINICAL DATA:  Lung nodule, abnormal chest radiograph, generalized weakness, nausea, decreased appetite, history hypertension EXAM: CT CHEST WITHOUT CONTRAST TECHNIQUE: Multidetector CT imaging of the  chest was performed following the standard protocol without IV contrast. RADIATION DOSE REDUCTION: This exam was performed according to the departmental dose-optimization program which includes automated exposure control, adjustment of the mA and/or kV according to patient size and/or use of iterative reconstruction technique. COMPARISON:  Chest radiograph 07/06/2021 FINDINGS: Cardiovascular: Aneurysmal dilatation ascending thoracic aorta 4.8 cm transverse image 75. Heart size normal. Trace pericardial fluid. Mediastinum/Nodes: Esophagus unremarkable. No thoracic adenopathy identified though assessment of hila is limited by lack of IV contrast. Base of cervical region normal appearance. Lungs/Pleura: Macrolobulated solid mass in  LEFT upper lobe abutting the major fissure, 3.5 x 3.1 x 2.8 cm highly concerning for a pulmonary neoplasm. Bibasilar atelectasis. Tiny calcified granuloma in lingula; no follow-up imaging recommended. No

## 2021-07-06 NOTE — ED Triage Notes (Addendum)
Pt reports nausea, fatigue, generalized weakness. Pt reports decreased appetite, "small" BMs today. ? ?Pt family reports syncope for "a few seconds" on Tuesday. Denies chest pain, shortness of breath at present.  ? ?Extreme fatigue and malaise noted in triage. Pt reports "I just don't feel right." ?

## 2021-07-06 NOTE — ED Triage Notes (Signed)
Pt to the ED via [facility] from [facility] where the pt was being evaluated for generalized weakness and fatigue. ? ?Pt had a syncopal episode on Tuesday for a few seconds. ? ?Pt endorses abdominal pain with emesis.  ? ?Pt has new onset A-flutter. ?

## 2021-07-07 ENCOUNTER — Other Ambulatory Visit (HOSPITAL_COMMUNITY): Payer: Self-pay | Admitting: *Deleted

## 2021-07-07 ENCOUNTER — Observation Stay (HOSPITAL_BASED_OUTPATIENT_CLINIC_OR_DEPARTMENT_OTHER): Payer: Medicare HMO

## 2021-07-07 ENCOUNTER — Observation Stay (HOSPITAL_COMMUNITY): Payer: Medicare HMO

## 2021-07-07 DIAGNOSIS — R0683 Snoring: Secondary | ICD-10-CM | POA: Diagnosis not present

## 2021-07-07 DIAGNOSIS — I639 Cerebral infarction, unspecified: Secondary | ICD-10-CM | POA: Diagnosis not present

## 2021-07-07 DIAGNOSIS — R918 Other nonspecific abnormal finding of lung field: Secondary | ICD-10-CM | POA: Diagnosis not present

## 2021-07-07 DIAGNOSIS — N179 Acute kidney failure, unspecified: Secondary | ICD-10-CM | POA: Diagnosis not present

## 2021-07-07 DIAGNOSIS — I4892 Unspecified atrial flutter: Secondary | ICD-10-CM | POA: Diagnosis not present

## 2021-07-07 DIAGNOSIS — I10 Essential (primary) hypertension: Secondary | ICD-10-CM | POA: Diagnosis not present

## 2021-07-07 DIAGNOSIS — R55 Syncope and collapse: Secondary | ICD-10-CM | POA: Diagnosis not present

## 2021-07-07 DIAGNOSIS — I4891 Unspecified atrial fibrillation: Secondary | ICD-10-CM | POA: Diagnosis not present

## 2021-07-07 LAB — LIPID PANEL
Cholesterol: 157 mg/dL (ref 0–200)
HDL: 41 mg/dL (ref >40)
LDL Cholesterol: 94 mg/dL (ref 0–99)
Total CHOL/HDL Ratio: 3.8 RATIO
Triglycerides: 109 mg/dL (ref <150)
VLDL: 22 mg/dL (ref 0–40)

## 2021-07-07 LAB — CBC
HCT: 45 % (ref 39.0–52.0)
Hemoglobin: 14.8 g/dL (ref 13.0–17.0)
MCH: 31.4 pg (ref 26.0–34.0)
MCHC: 32.9 g/dL (ref 30.0–36.0)
MCV: 95.5 fL (ref 80.0–100.0)
Platelets: 209 10*3/uL (ref 150–400)
RBC: 4.71 MIL/uL (ref 4.22–5.81)
RDW: 12.6 % (ref 11.5–15.5)
WBC: 5.9 10*3/uL (ref 4.0–10.5)
nRBC: 0 % (ref 0.0–0.2)

## 2021-07-07 LAB — BASIC METABOLIC PANEL
Anion gap: 8 (ref 5–15)
BUN: 24 mg/dL — ABNORMAL HIGH (ref 8–23)
CO2: 27 mmol/L (ref 22–32)
Calcium: 8.8 mg/dL — ABNORMAL LOW (ref 8.9–10.3)
Chloride: 103 mmol/L (ref 98–111)
Creatinine, Ser: 1.41 mg/dL — ABNORMAL HIGH (ref 0.61–1.24)
GFR, Estimated: 54 mL/min — ABNORMAL LOW (ref >60)
Glucose, Bld: 123 mg/dL — ABNORMAL HIGH (ref 70–99)
Potassium: 3.9 mmol/L (ref 3.5–5.1)
Sodium: 138 mmol/L (ref 135–145)

## 2021-07-07 LAB — ECHOCARDIOGRAM COMPLETE
AR max vel: 2.35 cm2
AV Area VTI: 2.55 cm2
AV Area mean vel: 2.29 cm2
AV Mean grad: 2 mmHg
AV Peak grad: 3.3 mmHg
Ao pk vel: 0.91 m/s
Area-P 1/2: 4.39 cm2
Height: 73 in
MV VTI: 2.12 cm2
S' Lateral: 3.4 cm
Weight: 2512.01 oz

## 2021-07-07 LAB — HIV ANTIBODY (ROUTINE TESTING W REFLEX): HIV Screen 4th Generation wRfx: NONREACTIVE

## 2021-07-07 LAB — HEMOGLOBIN A1C
Hgb A1c MFr Bld: 7.4 % — ABNORMAL HIGH (ref 4.8–5.6)
Mean Plasma Glucose: 165.68 mg/dL

## 2021-07-07 LAB — GLUCOSE, CAPILLARY: Glucose-Capillary: 151 mg/dL — ABNORMAL HIGH (ref 70–99)

## 2021-07-07 LAB — TSH: TSH: 1.119 u[IU]/mL (ref 0.350–4.500)

## 2021-07-07 MED ORDER — INSULIN ASPART 100 UNIT/ML IJ SOLN
0.0000 [IU] | Freq: Three times a day (TID) | INTRAMUSCULAR | Status: DC
  Administered 2021-07-07: 2 [IU] via SUBCUTANEOUS

## 2021-07-07 MED ORDER — ATORVASTATIN CALCIUM 40 MG PO TABS
40.0000 mg | ORAL_TABLET | Freq: Every day | ORAL | 0 refills | Status: DC
Start: 2021-07-08 — End: 2021-10-13

## 2021-07-07 MED ORDER — ATORVASTATIN CALCIUM 40 MG PO TABS
40.0000 mg | ORAL_TABLET | Freq: Every day | ORAL | Status: DC
  Administered 2021-07-07: 40 mg via ORAL
  Filled 2021-07-07: qty 1

## 2021-07-07 MED ORDER — APIXABAN 5 MG PO TABS: 5.0000 mg | ORAL_TABLET | Freq: Two times a day (BID) | ORAL | 0 refills | Status: DC

## 2021-07-07 MED ORDER — HYDRALAZINE HCL 20 MG/ML IJ SOLN: 10.0000 mg | INTRAMUSCULAR | Status: DC | PRN

## 2021-07-07 MED ORDER — ONDANSETRON HCL 4 MG/2ML IJ SOLN
4.0000 mg | Freq: Four times a day (QID) | INTRAMUSCULAR | Status: DC | PRN
  Administered 2021-07-07: 4 mg via INTRAVENOUS
  Filled 2021-07-07: qty 2

## 2021-07-07 MED ORDER — INSULIN ASPART 100 UNIT/ML IJ SOLN: 0.0000 [IU] | Freq: Every day | INTRAMUSCULAR | Status: DC

## 2021-07-07 MED ORDER — ONDANSETRON 4 MG PO TBDP: 4.0000 mg | ORAL_TABLET | Freq: Three times a day (TID) | ORAL | 0 refills | Status: AC | PRN

## 2021-07-07 NOTE — Progress Notes (Signed)
SLP Cancellation Note ? ?Patient Details ?Name: Chris Lucas ?MRN: [id number] ?DOB: Oct 12, 1950 ? ? ?Cancelled treatment:       Reason Eval/Treat Not Completed: SLP screened, no needs identified, will sign off. Speech, language and cognition are functioning at baseline. Thank you, ? ?Claryce Friel H. Izora Ribas MA, CCC-SLP ?Speech Language Pathologist ? ? ? ?Wende Bushy ?07/07/2021, 9:10 AM ?

## 2021-07-07 NOTE — Consult Note (Signed)
[facility] ? ? ?Patient name: Chris Lucas Admit date: 07/06/2021  ?DOB: 1950-08-03 LOS: 0  ?MRN: [id number] Consult date: 07/07/2021  ?Referring provider: Dr. Doristine Bosworth, [facility]: Lung mass  ? ? ?History:  ?71 yo male with second hand tobacco exposure presented to [facility] with nausea, fatigue, weakness, decreased appetite, and syncope.  Found to have atrial flutter and acute CVA.  CT chest showed left upper lobe mass concerning for malignancy.  PCCM consult to assess lung mass. ? ?Past medical history:  ?HTN, HLD, Pre-DM  ? ?Significant events:  ?3/30 Admit ? ?Studies:  ?CT chest 07/06/21 >> 4.8 cm ascending thoracic aorta, 3.5 x 3.1 x 2.8 cm macrolobulated solid mass in LUL abutting major fissure, tiny granuloma in lingula ?MRI brain 07/06/21 >> 4 mm acute ischemic infarct in the inferior Rt cerebellum ? ?Micro:  ? ? Lines:  ? ?  ?Antibiotics:  ? ? Consults:  ? ?  ? ?Interim history:  ?He never smoked.  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  He was eating a tuna sandwich and felt sick in his stomach after this.  He felt hot and sweaty, and then suddenly cold.  His wife saw his eyes role in the back of his head and he was unresponsive for a few seconds.  After he woke up he was able to recognize his wife immediately and no issues with speech.   ? ?He has not have weight loss, cough, sputum, chest pain, or hemoptysis.  He exercises on a regular basis.  His wife says he snores, and has told him that she is worried he has sleep apnea. ? ?Vital signs:  ?BP (!) 146/95 (BP Location: Left Arm)   Pulse 75   Temp 97.9 ?F (36.6 ?C) (Oral)   Resp 18   Ht 6\' 1"  (1.854 m)   Wt 71.2 kg   SpO2 99%   BMI 20.71 kg/m?  ? Intake/output:  ?I/O last 3 completed shifts: ?In: 360.5 [I.V.:360.5] ?Out: -  ?  ?Physical exam:  ? ?General - alert ?Eyes - pupils reactive ?ENT - no sinus tenderness, no stridor ?Cardiac - regular rate/rhythm, no murmur ?Chest -  equal breath sounds b/l, no wheezing or rales ?Abdomen - soft, non tender, + bowel sounds ?Extremities - no cyanosis, clubbing, or edema ?Skin - no rashes ?Neuro - normal strength, moves extremities, follows commands ?Psych - normal mood and behavior ? Best practice:  ? ?DVT - SCD ?SUP - NA ?Nutrition - Heart healthy  ? ?Assessment/plan:  ? ?Lt upper lobe lung mass with history of tobacco abuse. ?- this is concerning for malignancy ?- he will need further assessment with bronchoscopy >> this can be done as an outpt ?- I have scheduled pulmonary office follow up in [place] with Dr. Baltazar Apo on Wednesday April 5 at 3 pm ? ?Snoring. ?- his wife is concerned he could have sleep apnea ?- will need further assessment as an outpt ? ?Syncope. ?New onset atrial flutter. 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 ?Hx of HTN. ?AKI with CKD 2. ?- per primary team ? ?Resolved hospital problems:  ? ? ?Goals of care/Family discussions:  ?Code status: full ? ?Labs:  ? ? ?  Latest Ref Rng & Units 07/07/2021  ?  4:24 AM 07/06/2021  ?  1:19 PM 05/27/2008  ?  8:04 PM  ?CMP  ?Glucose 70 - 99 mg/dL 123   217   84    ?  BUN 8 - 23 mg/dL 24   24   14     ?Creatinine 0.61 - 1.24 mg/dL 1.41   1.56   1.21    ?Sodium 135 - 145 mmol/L 138   133   140    ?Potassium 3.5 - 5.1 mmol/L 3.9   4.0   4.3    ?Chloride 98 - 111 mmol/L 103   97   102    ?CO2 22 - 32 mmol/L 27   24   29     ?Calcium 8.9 - 10.3 mg/dL 8.8   9.3   10.0    ?Total Protein 6.0 - 8.3 g/dL   7.6    ?Total Bilirubin 0.3 - 1.2 mg/dL   0.8    ?Alkaline Phos 39 - 117 units/L   82    ?AST 0 - 37 units/L   18    ?ALT 0 - 53 units/L   20    ? ? ? ?  Latest Ref Rng & Units 07/07/2021  ?  4:24 AM 07/06/2021  ?  1:19 PM 05/27/2008  ?  8:04 PM  ?CBC  ?WBC 4.0 - 10.5 K/uL 5.9   5.7   5.8    ?Hemoglobin 13.0 - 17.0 g/dL 14.8   15.4   15.7    ?Hematocrit 39.0 - 52.0 % 45.0   45.7   48.5    ?Platelets 150 - 400 K/uL 209   231   254    ? ? ?ABG ?No results found for:  PHART, PCO2ART, PO2ART, HCO3, TCO2, ACIDBASEDEF, O2SAT ? ?CBG (last 3)  ?Recent Labs  ?  07/07/21 ?1221  ?GLUCAP 151*  ? ? ? ?Past surgical history:  ?He  has no past surgical history on file. ? Social history:  ?He  reports that he has never smoked. He has never used smokeless tobacco. He reports that he does not currently use alcohol. He reports that he does not currently use drugs. ?  ?Review of systems:  ?Reviewed and negative ? Family history:  ?His family history includes Diabetes in his mother. ?  ? ?Medications:  ? ?No current facility-administered medications on file prior to encounter.  ? ?Current Outpatient Medications on File Prior to Encounter  ?Medication Sig  ? amLODipine (NORVASC) 10 MG tablet Take 10 mg by mouth daily.  ? anti-nausea (EMETROL) solution Take 10 mLs by mouth every 15 (fifteen) minutes as needed for nausea or vomiting.  ? bisacodyl 5 MG EC tablet Take 5 mg by mouth daily as needed for moderate constipation.  ? furosemide (LASIX) 20 MG tablet Take 20 mg by mouth daily as needed for fluid.  ? olmesartan (BENICAR) 40 MG tablet Take 40 mg by mouth daily.  ?  ? ?Signature:  ?Chesley Mires, MD ?[name] ?Pager - [phone] ?07/07/2021, 2:16 PM ? ? ? ? ? ? ? ?

## 2021-07-07 NOTE — Care Management Obs Status (Signed)
MEDICARE OBSERVATION STATUS NOTIFICATION ? ? ?Patient Details  ?Name: Chris Lucas ?MRN: [id number] ?Date of Birth: 1950-11-18 ? ? ?Medicare Observation Status Notification Given:  Yes ? ? ? ?Tommy Medal ?07/07/2021, 3:02 PM ?

## 2021-07-07 NOTE — Evaluation (Signed)
Physical Therapy Evaluation ?Patient Details ?Name: Chris Lucas ?MRN: [id number] ?DOB: 01-18-1951 ?Today's Date: 07/07/2021 ? ?History of Present Illness ? 71 y.o. M admitted on 07/06/21 due to nausea and a syncopal episode. Pt was found to have a R cerebellar infarct, R lung mass, Aflutter, and Aortic Aneurysm. PMH significant for HTN, HLD, prediabetes. ?  ?Clinical Impression ? Patient functioning at baseline for functional mobility and gait demonstrating good return for ambulation on level, inclined, declined surfaces, stairs without loss of balance and no c/o numbness or tingling.  Plan:  Patient discharged from physical therapy to care of nursing for ambulation daily as tolerated for length of stay.  ?   ?   ? ?Recommendations for follow up therapy are one component of a multi-disciplinary discharge planning process, led by the attending physician.  Recommendations may be updated based on patient status, additional functional criteria and insurance authorization. ? ?Follow Up Recommendations No PT follow up ? ?  ?Assistance Recommended at Discharge PRN  ?Patient can return home with the following ? Other (comment) (patient near baseline) ? ?  ?Equipment Recommendations None recommended by PT  ?Recommendations for Other Services ?    ?  ?Functional Status Assessment Patient has not had a recent decline in their functional status  ? ?  ?Precautions / Restrictions Precautions ?Precautions: None ?Restrictions ?Weight Bearing Restrictions: No  ? ?  ? ?Mobility ? Bed Mobility ?Overal bed mobility: Independent ?  ?  ?  ?  ?  ?  ?  ?  ? ?Transfers ?Overall transfer level: Modified independent ?  ?  ?  ?  ?  ?  ?  ?  ?  ?  ? ?Ambulation/Gait ?Ambulation/Gait assistance: Modified independent (Device/Increase time) ?Gait Distance (Feet): 200 Feet ?Assistive device: None ?Gait Pattern/deviations: WFL(Within Functional Limits) ?Gait velocity: slightly decreased ?  ?  ?General Gait Details: grossly WFL with good return  for ambulation on level, inclined and declined surfaces without loss of balance ? ?Stairs ?Stairs: Yes ?Stairs assistance: Modified independent (Device/Increase time) ?Stair Management: One rail Left, Alternating pattern ?Number of Stairs: 5 ?General stair comments: demonstrates good return for going up/down steps without loss of balance ? ?Wheelchair Mobility ?  ? ?Modified Rankin (Stroke Patients Only) ?  ? ?  ? ?Balance Overall balance assessment: No apparent balance deficits (not formally assessed) ?  ?  ?  ?  ?  ?  ?  ?  ?  ?  ?  ?  ?  ?  ?  ?  ?  ?  ?   ? ? ? ?Pertinent Vitals/Pain Pain Assessment ?Pain Assessment: Faces ?Faces Pain Scale: Hurts a little bit ?Pain Location: stomach ?Pain Descriptors / Indicators: Aching ?Pain Intervention(s): Limited activity within patient's tolerance, Monitored during session  ? ? ?Home Living Family/patient expects to be discharged to:: Private residence ?Living Arrangements: Spouse/significant other ?Available Help at Discharge: Family;Available 24 hours/day ?Type of Home: House ?Home Access: Stairs to enter ?Entrance Stairs-Rails: Can reach both ?Entrance Stairs-Number of Steps: 5-6 stairs ?Alternate Level Stairs-Number of Steps: 14 stairs ?Home Layout: Two level ?Home Equipment: Grab bars - tub/shower;Rolling Walker (2 wheels);Cane - single point;Wheelchair - manual ?   ?  ?Prior Function Prior Level of Function : Independent/Modified Independent ?  ?  ?  ?  ?  ?  ?Mobility Comments: Community ambulator without AD, drives ?ADLs Comments: Independent ?  ? ? ?Hand Dominance  ? Dominant Hand: Right ? ?  ?Extremity/Trunk Assessment  ?  Upper Extremity Assessment ?Upper Extremity Assessment: Defer to OT evaluation ?  ? ?Lower Extremity Assessment ?Lower Extremity Assessment: Overall WFL for tasks assessed ?  ? ?Cervical / Trunk Assessment ?Cervical / Trunk Assessment: Normal  ?Communication  ? Communication: No difficulties  ?Cognition Arousal/Alertness: Awake/alert ?Behavior  During Therapy: [facility] for tasks assessed/performed ?Overall Cognitive Status: Within Functional Limits for tasks assessed ?  ?  ?  ?  ?  ?  ?  ?  ?  ?  ?  ?  ?  ?  ?  ?  ?  ?  ?  ? ?  ?General Comments   ? ?  ?Exercises    ? ?Assessment/Plan  ?  ?PT Assessment Patient does not need any further PT services  ?PT Problem List   ? ?   ?  ?PT Treatment Interventions     ? ?PT Goals (Current goals can be found in the Care Plan section)  ?Acute Rehab PT Goals ?Patient Stated Goal: return home ?PT Goal Formulation: With patient/family ?Time For Goal Achievement: 07/07/21 ?Potential to Achieve Goals: Good ? ?  ?Frequency   ?  ? ? ?Co-evaluation   ?  ?  ?  ?  ? ? ?  ?AM-PAC PT "6 Clicks" Mobility  ?Outcome Measure Help needed turning from your back to your side while in a flat bed without using bedrails?: None ?Help needed moving from lying on your back to sitting on the side of a flat bed without using bedrails?: None ?Help needed moving to and from a bed to a chair (including a wheelchair)?: None ?Help needed standing up from a chair using your arms (e.g., wheelchair or bedside chair)?: None ?Help needed to walk in hospital room?: None ?Help needed climbing 3-5 steps with a railing? : None ?6 Click Score: 24 ? ?  ?End of Session   ?Activity Tolerance: Patient tolerated treatment well ?Patient left: in bed;with call bell/phone within reach;with family/visitor present ?Nurse Communication: Mobility status ?PT Visit Diagnosis: Unsteadiness on feet (R26.81);Other abnormalities of gait and mobility (R26.89);Muscle weakness (generalized) (M62.81) ?  ? ?Time: 5397-6734 ?PT Time Calculation (min) (ACUTE ONLY): 22 min ? ? ?Charges:   PT Evaluation ?$PT Eval Moderate Complexity: 1 Mod ?PT Treatments ?$Therapeutic Activity: 8-22 mins ?  ?   ? ? ?10:08 AM, 07/07/21 ?Lonell Grandchild, MPT ?Physical Therapist with [place] ?[facility] ?[phone] office ?[phone] mobile phone ? ? ?

## 2021-07-07 NOTE — Discharge Summary (Addendum)
PatientPhysician Discharge Summary  ?Chris Lucas DOB: 1950-12-13 DOA: 07/06/2021 ? ?PCP: [name], MD ? ?Admit date: 07/06/2021 ?Discharge date: 07/07/2021 ? ? ? ?Admitted From: Home ?Disposition:  Home ? ?Recommendations for Outpatient Follow-up:  ?Follow up with PCP in 1-2 weeks ?Please obtain BMP/CBC in one week ?Follow up with Pulmonology on 07/10/21 for lung mass ?Follow up with Neurology in 4 weeks  ?Follow-up with cardiothoracic surgery in 4 weeks ?Please follow up with your PCP on the following pending results: ?Unresulted Labs (From admission, onward)  ? ? None  ? ?  ?  ? ? ?Home Health:None ?Equipment/Devices:None ? ?Discharge Condition:Stable ?CODE STATUS:Full code ?Diet recommendation: Cardiac ? ?Subjective:Seen and examined.  Wife at the bedside.  Patient has no complaints.  No weakness in any part of the body.  No dizziness. ? ?Following his HPI that is copied from my colleague admitting hospitalist Dr. Waldron Labs. ?Chris Lucas  is a 71 y.o. male, with past medical history of hypertension, hyperlipidemia, prediabetes, presents to ED for evaluation of syncope, wife at bedside assisted with the history, patient reports he was eating lunch with his wife, when he had upset stomach, felt nauseous, wife reported loss of consciousness for 10 to 30 seconds, denies any chest pain, shortness of breath, hemoptysis or lower extremity edema, he denies any focal deficits, but he does report tingling sensation in lower extremities, denies any history of smoking as well. ?- in ED patient was noted to have a flutter with 4-1 block, CT chest without contrast significant for left upper lobe lung mass suspicious for malignancy, creatinine elevated at 1.5, from baseline of 1.2, called his CT chest significant for thoracic aortic aneurysm 4.8 cm, [facility] hospitalist consulted to admit. ? ?Brief/Interim Summary: Patient was initially admitted with the diagnosis and further work-up of syncope but he was  also found to have new onset of atrial flutter at the time of admission.  Subsequently MRI brain showed acute ischemic stroke.  Neurology was consulted, they recommended continuing anticoagulation but no antiplatelet since the cause of his stroke was embolic secondary to new onset atrial flutter.  His rates were controlled.  He was also found to have new lung mass, 4.8 cm aortic aneurysm as well. Recommend semi-annual imaging ?followup by CTA or MRA and referral to cardiothoracic surgery.  Patient was seen by pulmonology for his lung mass.  They have scheduled him to see as outpatient with Dr. Malvin Johns on 07/10/2021 and they will plan outpatient bronchoscopy.  He was also seen by cardiology for new atrial flutter.  They recommended transitioning to Eliquis and no other medications and no other work-up.  His rates are controlled.  Patient is being prescribed Eliquis at the time of discharge.  He has been on Lovenox full dose during this hospitalization since yesterday.  I also discussed the case with Dr. Leonel Ramsay of neurology who reviewed patient's imagings including MRA of the head and neck and recommended no other medications other than Eliquis.  He will follow-up with neurology in 4 weeks.  Patient is non-smoker.  I met with the patient and his wife in the room this morning and answered several questions.  Erich Montane spoke to him in the afternoon and reviewed his imaging studies with him and answered several other questions.  He is agreeable with the plan.  He verbalized understanding as well.  He was seen by PT OT and since he is at his baseline, they did not recommend any home health PT OT.  He  has been cleared by all the consultant so he is going to be discharged in stable condition. ? ?Discharge plan was discussed with patient and/or family member and they verbalized understanding and agreed with it.  ?Discharge Diagnoses:  ?Principal Problem: ?  Syncope ?Active Problems: ?  Lung mass ?  Essential hypertension ?   Hyperlipidemia ?  Morbid obesity ([place]) ?  Thoracic aortic aneurysm ([place]) ?  Acute ischemic stroke ([place]) ? ? ? ?Discharge Instructions ? ? ?Allergies as of 07/07/2021   ? ?   Reactions  ? Ivp Dye [iodinated Contrast Media]   ? ?  ? ?  ?Medication List  ?  ? ?TAKE these medications   ? ?amLODipine 10 MG tablet ?Commonly known as: NORVASC ?Take 10 mg by mouth daily. ?  ?anti-nausea solution ?Take 10 mLs by mouth every 15 (fifteen) minutes as needed for nausea or vomiting. ?  ?apixaban 5 MG Tabs tablet ?Commonly known as: ELIQUIS ?Take 1 tablet (5 mg total) by mouth 2 (two) times daily. ?  ?atorvastatin 40 MG tablet ?Commonly known as: LIPITOR ?Take 1 tablet (40 mg total) by mouth daily. ?Start taking on: July 08, 2021 ?  ?bisacodyl 5 MG EC tablet ?Generic drug: bisacodyl ?Take 5 mg by mouth daily as needed for moderate constipation. ?  ?furosemide 20 MG tablet ?Commonly known as: LASIX ?Take 20 mg by mouth daily as needed for fluid. ?  ?olmesartan 40 MG tablet ?Commonly known as: BENICAR ?Take 40 mg by mouth daily. ?  ?ondansetron 4 MG disintegrating tablet ?Commonly known as: ZOFRAN-ODT ?Take 1 tablet (4 mg total) by mouth every 8 (eight) hours as needed for nausea or vomiting. ?  ? ?  ? ? Follow-up Information   ? ? Collene Gobble, MD Follow up on 07/12/2021.   ?Specialty: Pulmonary Disease ?Why: Follow up with lung doctor at 3 pm to discuss scheduling your lung biopsy. ?Contact information: ?[address] ?[address] ?[place] [address] ?[phone] ? ? ?  ?  ? ? [name], MD Follow up in 1 week(s).   ?Specialty: Internal Medicine ?Contact information: ?[address] ?[place] ?[place] [address] ?[phone] ? ? ?  ?  ? ? [facility] Follow up in 4 week(s).   ?Contact information: ?[place] [address] ?[phone] ? ?  ?  ? ? [facility] [place] Follow up in 4 week(s).   ?Specialty: Cardiothoracic  Surgery ?Contact information: ?[place], [place] ?[place] ?[phone] ? ?  ?  ? ?  ?  ? ?  ? ?Allergies  ?Allergen Reactions  ? Ivp Dye [Iodinated Contrast Media]   ? ? ?Consultations: Neurology, cardiology, pulmonology ? ? ?Procedures/Studies: ?DG Chest 2 View ? ?Result Date: 07/06/2021 ?CLINICAL DATA:  Abdominal pain, nausea. EXAM: CHEST - 2 VIEW COMPARISON:  March 26, 2007. FINDINGS: Stable cardiomediastinal silhouette. Right lung is clear. Stable elevated left hemidiaphragm is noted with mild left basilar subsegmental atelectasis. There is a rounded density in the left lower lobe concerning for possible neoplasm. Bony thorax is unremarkable. IMPRESSION: Elevated left hemidiaphragm is noted with mild left basilar subsegmental atelectasis. Rounded nodular density seen in left lung base concerning for neoplasm; CT scan of the chest with intravenous contrast is recommended for further evaluation. Electronically Signed   By: Marijo Conception M.D.   On: 07/06/2021 13:41  ? ?CT Chest Wo Contrast ? ?  Result Date: 07/06/2021 ?CLINICAL DATA:  Lung nodule, abnormal chest radiograph, generalized weakness, nausea, decreased appetite, history hypertension EXAM: CT CHEST WITHOUT CONTRAST TECHNIQUE: Multidetector CT imaging of the chest was performed following the standard protocol without IV contrast. RADIATION DOSE REDUCTION: This exam was performed according to the departmental dose-optimization program which includes automated exposure control, adjustment of the mA and/or kV according to patient size and/or use of iterative reconstruction technique. COMPARISON:  Chest radiograph 07/06/2021 FINDINGS: Cardiovascular: Aneurysmal dilatation ascending thoracic aorta 4.8 cm transverse image 75. Heart size normal. Trace pericardial fluid. Mediastinum/Nodes: Esophagus unremarkable. No thoracic adenopathy identified though assessment of hila is limited by lack of IV contrast. Base of cervical  region normal appearance. Lungs/Pleura: Macrolobulated solid mass in LEFT upper lobe abutting the major fissure, 3.5 x 3.1 x 2.8 cm highly concerning for a pulmonary neoplasm. Bibasilar atelectasis. Tiny calcified g

## 2021-07-07 NOTE — Progress Notes (Incomplete)
Patient seen and discussed with PA Ahmed Prima, I agree with her documentation. 71 yo male history of HTN, HL, admitted with syncope. Stomach felt upset and nauseous while eating lunch, transient LOC. Reports was sitting eating a sandwich, suddenly felt very nauseous and diaphoretic, transient LOC for about 30 seconds. Felt very fatigued afterward. No recent palpitaitons.  ? ?ER vitals: p 72 bp 138/90 98% RA ?Na 133 K 4 BUN 24 Cr 1.56 WBC 5.7 Hgb 15.4 Plt 231 TSH 1.119 Mg 2.4 Hgb A1c 7.4 TC 191 TG 252 HDL 45 LDL 95 ? ?EKG aflutter rate 57, lateral precordial TWIs ? ?CXR left lung mass ?CT chest: LUL mass  ?MRI brain: 4 mm acute nonhemorragic infarct involving inferior right cerebellum, no metastatic disease.  ? ? ? ? ?1.Syncope ?- unclear etiology, history alone would not suggest cardiogenic given associated prodrome. New diagnosis of aflutter but normal rates this admission. Labs would suggest he was hypovolemic on presentation, low Na/Cl, elevated BUN and Cr. I don't see that orthostatics were done.  ?- continue to monitor tele, f/u echo. At this time I do not suspect this was cardiogenic syncope. Perhaps neorocardiogenic with some contribution of hypovolemia.  ? ? ?2. Atrial flutter ?- new diagnosis this admission ?- tele data not available due to technical issues, discussing with nurseing,  from EKGs and vitals checks he is self rate controlled, not requiring AV nodal agents.  ?- CHADS2Vasc score is 5 (HTN, Age x 1, DM2, CVA x2). Evidence of acute CVA, from notes neuro ok to anticoagulation. He is on lovenox, can change to eliquis 5mg  bid when ok from neuro and pulmonary standpoint pending any neccesary procedures.  ? ? ?2. AKI ?- Cr 1.56 on admission, down to 1.41 with IVFs.  ? ? ?3. Hyponatremia ?- resolved with IVFs, appears to have been hypovolemic hyponatremia. ? ?4. Lung mass ?- per primary team and pulmonary ? ?5. Aortic aneurysm ?- ascneding thoracic aneurysm 4.8 cm.  ?- will need repeat scan 6  months ? ?Carlyle Dolly MD ?

## 2021-07-07 NOTE — Progress Notes (Signed)
*  PRELIMINARY RESULTS* ?Echocardiogram ?2D Echocardiogram has been performed. ? ?Elpidio Anis ?07/07/2021, 12:31 PM ?

## 2021-07-07 NOTE — Evaluation (Signed)
Occupational Therapy Evaluation ?Patient Details ?Name: Chris Lucas ?MRN: [id number] ?DOB: 08/24/1950 ?Today's Date: 07/07/2021 ? ? ?History of Present Illness 71 y.o. M admitted on 07/06/21 due to nausea and a syncopal episode. Pt was found to have a R cerebellar infarct, R lung mass, Aflutter, and Aortic Aneurysm. PMH significant for HTN, HLD, prediabetes.  ? ?Clinical Impression ?  ?Pt admitted for concerns listed above. PTA pt reported that he was independent with all ADL's and IADL's, including bowling in a league and exercising multiple times a week. At this time, pt presents at his baseline with independence completing all BADL's and functional mobility with no AD. Vision and cognition appear WFL. He has no further OT needs and acute OT will sign off.  ?   ? ?Recommendations for follow up therapy are one component of a multi-disciplinary discharge planning process, led by the attending physician.  Recommendations may be updated based on patient status, additional functional criteria and insurance authorization.  ? ?Follow Up Recommendations ? No OT follow up  ?  ?Assistance Recommended at Discharge None  ?Patient can return home with the following   ? ?  ?Functional Status Assessment ? Patient has not had a recent decline in their functional status  ?Equipment Recommendations ? None recommended by OT  ?  ?Recommendations for Other Services   ? ? ?  ?Precautions / Restrictions Precautions ?Precautions: None ?Restrictions ?Weight Bearing Restrictions: No  ? ?  ? ?Mobility Bed Mobility ?Overal bed mobility: Independent ?  ?  ?  ?  ?  ?  ?  ?  ? ?Transfers ?Overall transfer level: Modified independent ?  ?  ?  ?  ?  ?  ?  ?  ?  ?  ? ?  ?Balance Overall balance assessment: No apparent balance deficits (not formally assessed) ?  ?  ?  ?  ?  ?  ?  ?  ?  ?  ?  ?  ?  ?  ?  ?  ?  ?  ?   ? ?ADL either performed or assessed with clinical judgement  ? ?ADL Overall ADL's : At baseline;Independent ?  ?  ?  ?  ?  ?  ?  ?   ?  ?  ?  ?  ?  ?  ?  ?  ?  ?  ?  ?General ADL Comments: Pt at his baseline, able to complete all ADL's and functional mobility with no assist, safely.  ? ? ? ?Vision Baseline Vision/History: 1 Wears glasses ?Ability to See in Adequate Light: 0 Adequate ?Patient Visual Report: No change from baseline ?Vision Assessment?: No apparent visual deficits  ?   ?Perception   ?  ?Praxis   ?  ? ?Pertinent Vitals/Pain Pain Assessment ?Pain Assessment: No/denies pain  ? ? ? ?Hand Dominance Right ?  ?Extremity/Trunk Assessment Upper Extremity Assessment ?Upper Extremity Assessment: Overall WFL for tasks assessed ?  ?Lower Extremity Assessment ?Lower Extremity Assessment: Defer to PT evaluation ?  ?Cervical / Trunk Assessment ?Cervical / Trunk Assessment: Normal ?  ?Communication Communication ?Communication: No difficulties ?  ?Cognition Arousal/Alertness: Awake/alert ?Behavior During Therapy: [facility] for tasks assessed/performed ?Overall Cognitive Status: Within Functional Limits for tasks assessed ?  ?  ?  ?  ?  ?  ?  ?  ?  ?  ?  ?  ?  ?  ?  ?  ?  ?  ?  ?General Comments  VSS on RA ? ?  ?  Exercises   ?  ?Shoulder Instructions    ? ? ?Home Living Family/patient expects to be discharged to:: Private residence ?Living Arrangements: Spouse/significant other ?Available Help at Discharge: Family;Available 24 hours/day ?Type of Home: House ?Home Access: Stairs to enter ?Entrance Stairs-Number of Steps: 5-6 stairs ?Entrance Stairs-Rails: Can reach both ?Home Layout: Two level ?Alternate Level Stairs-Number of Steps: 14 stairs ?Alternate Level Stairs-Rails: Left ?Bathroom Shower/Tub: Walk-in shower ?  ?Bathroom Toilet: Standard ?  ?  ?Home Equipment: Grab bars - tub/shower;Rolling Walker (2 wheels);Cane - single point;Wheelchair - manual ?  ?  ?  ? ?  ?Prior Functioning/Environment Prior Level of Function : Independent/Modified Independent ?  ?  ?  ?  ?  ?  ?Mobility Comments: Community ambulator without AD, drives ?ADLs Comments:  Independent ?  ? ?  ?  ?OT Problem List: Decreased strength;Decreased activity tolerance;Impaired balance (sitting and/or standing) ?  ?   ?OT Treatment/Interventions:    ?  ?OT Goals(Current goals can be found in the care plan section) Acute Rehab OT Goals ?Patient Stated Goal: To go home ?OT Goal Formulation: All assessment and education complete, DC therapy ?Time For Goal Achievement: 07/07/21 ?Potential to Achieve Goals: Good  ?OT Frequency:   ?  ? ?Co-evaluation   ?  ?  ?  ?  ? ?  ?AM-PAC OT "6 Clicks" Daily Activity     ?Outcome Measure Help from another person eating meals?: None ?Help from another person taking care of personal grooming?: None ?Help from another person toileting, which includes using toliet, bedpan, or urinal?: None ?Help from another person bathing (including washing, rinsing, drying)?: None ?Help from another person to put on and taking off regular upper body clothing?: None ?Help from another person to put on and taking off regular lower body clothing?: None ?6 Click Score: 24 ?  ?End of Session Nurse Communication: Mobility status ? ?Activity Tolerance: Patient tolerated treatment well ?Patient left: in bed;with call bell/phone within reach;with family/visitor present ? ?OT Visit Diagnosis: Unsteadiness on feet (R26.81);Other abnormalities of gait and mobility (R26.89);Muscle weakness (generalized) (M62.81)  ?              ?Time: 4098-1191 ?OT Time Calculation (min): 23 min ?Charges:  OT General Charges ?$OT Visit: 1 Visit ?OT Evaluation ?$OT Eval Moderate Complexity: 1 Mod ?OT Treatments ?$Therapeutic Activity: 8-22 mins ? ?Garlen Reinig H., OTR/L ?Acute Rehabilitation ? ?Edvardo Honse Elane Yolanda Bonine ?07/07/2021, 11:53 AM ?

## 2021-07-07 NOTE — Consult Note (Addendum)
?Cardiology Consultation:  ? ?Patient ID: Chris Lucas ?MRN: [id number]; DOB: 1951-01-16 ? ?Admit date: 07/06/2021 ?Date of Consult: 07/07/2021 ? ?PCP:  [name], MD ?  ?[facility] Providers ?Cardiologist: New to [facility] - Dr. Harl Bowie ? ?Patient Profile:  ? ?Chris Lucas is a 71 y.o. male with a hx of HTN and Stage 2-3 CKD who is being seen 07/07/2021 for the evaluation of new-onset atrial flutter and syncope at the request of Dr. Waldron Labs. ? ?History of Present Illness:  ? ?Mr. Kuster presented to [facility] ED on 07/06/2021 for evaluation after initially being evaluated at Urgent Care for nausea, fatigue and generalized weakness for the past several days along with a syncopal episode occurring 2 days prior to evaluation. In talking with the patient and his wife today, he reports he is usually very active at baseline and walks around the neighborhood and also lifts weights routinely. Denies any recent chest pain or dyspnea on exertion with these activities. No recent palpitations. No specific orthopnea, PND or pitting edema. This past Tuesday, he did have an episode of diaphoresis and his wife reports he lost consciousness for a few seconds but did not have any neurologic deficits afterwards. He had consumed a tuna sandwich before and experienced nausea afterwards but no active vomiting.  ? ?He does see his PCP for an annual physical but has never been told he has an irregular heart rhythm or heart rate. No known history of CAD, CHF or cardiac arrhythmias. ? ?Initial labs showed WBC 5.7, Hgb 15.4, platelets 231, Na+ 133, K+ 4.0 and creatinine 1.56 (no recent values for comparison as this was at 1.21 in 2010). TSH 1.119. Magnesium 2.4. Hemoglobin A1c 7.4. FLP shows total cholesterol 157, triglycerides 109, HDL 41 and LDL 94.  EKG showed rate-controlled atrial flutter, heart rate 57 with deep TWI along the precordial leads. CXR showed elevated left hemidiaphragm with mild left basilar atelectasis and a  rounded nodular density along the left lung concerning for neoplasm. Chest CT was obtained for further evaluation and showed a macrolobulated solid mass in the left upper lobe which was highly suspicious for pulmonary neoplasm and PET or tissue sampling recommended for further evaluation. 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 ? ?He was started on full-dose Lovenox instead of a DOAC at the time of admission in case of further evaluation of his lung mass this admission. The Hospitalist did review his case with Neurology and they recommended full anticoagulation as opposed to ASA given his stroke was likely due to newly diagnosed atrial flutter.  ? ? ?Past Medical History:  ?Diagnosis Date  ? Hypertension   ? ? ?History reviewed. No pertinent surgical history.  ? ?Home Medications:  ?Prior to Admission medications   ?Medication Sig Start Date End Date Taking? Authorizing Provider  ?amLODipine (NORVASC) 10 MG tablet Take 10 mg by mouth daily. 12/20/20  Yes [provider]  ?anti-nausea (EMETROL) solution Take 10 mLs by mouth every 15 (fifteen) minutes as needed for nausea or vomiting.   Yes [provider]  ?bisacodyl 5 MG EC tablet Take 5 mg by mouth daily as needed for moderate constipation.   Yes [provider]  ?furosemide (LASIX) 20 MG tablet Take 20 mg by mouth daily as needed for fluid. 11/22/20  Yes [provider]  ?olmesartan (BENICAR) 40 MG tablet Take 40 mg by mouth daily. 11/05/20  Yes [provider]  ? ? ?Inpatient Medications: ?Scheduled Meds: ?  stroke: mapping our early stages of recovery book   Does not apply Once  ? atorvastatin  40 mg Oral Daily  ? enoxaparin (LOVENOX) injection  1 mg/kg Subcutaneous Q12H  ? insulin aspart  0-5 Units  Subcutaneous QHS  ? insulin aspart  0-9 Units Subcutaneous TID WC  ? ?Continuous Infusions: ? sodium chloride 50 mL/hr at 07/06/21 1900  ? ?PRN Meds: ?acetaminophen **OR** acetaminophen, bisacodyl, hydrALAZINE, ondansetron (ZOFRAN) IV ? ?Allergies:    ?Allergies  ?Allergen Reactions  ? Ivp Dye [Iodinated Contrast Media]   ? ? ?Social History:   ?Social History  ? ?Socioeconomic History  ? Marital status: Married  ?  Spouse name: Not on file  ? Number of children: Not on file  ? Years of education: Not on file  ? Highest education level: Not on file  ?Occupational History  ? Not on file  ?Tobacco Use  ? Smoking status: Never  ? Smokeless tobacco: Never  ?Vaping Use  ? Vaping Use: Never used  ?Substance and Sexual Activity  ? Alcohol use: Not Currently  ? Drug use: Not Currently  ? Sexual activity: Not Currently  ?Other Topics Concern  ? Not on file  ?Social History Narrative  ? Not on file  ? ?Social Determinants of Health  ? ?Financial Resource Strain: Not on file  ?Food Insecurity: Not on file  ?Transportation Needs: Not on file  ?Physical Activity: Not on file  ?Stress: Not on file  ?Social Connections: Not on file  ?Intimate Partner Violence: Not on file  ?  ?Family History:   ? ?Family History  ?Problem Relation Age of Onset  ? Diabetes Mother   ?  ? ?ROS:  ?Please see the history of present illness.  ? ?All other ROS reviewed and negative.    ? ?Physical Exam/Data:  ? ?Vitals:  ? 07/06/21 1236 07/07/21 0110 07/07/21 0530 07/07/21 0800  ?BP:  (!) 139/93 (!) 157/97 (!) 153/99  ?Pulse:  73 74 74  ?Resp:  16 18 18   ?Temp:  98.4 ?F (36.9 ?C) 98.3 ?F (36.8 ?C) 97.7 ?F (36.5 ?C)  ?TempSrc:  Oral Oral Oral  ?SpO2:  100% 100% 98%  ?Weight: 71.2 kg     ?Height: 6\' 1"  (1.854 m)     ? ? ?Intake/Output Summary (Last 24 hours) at 07/07/2021 1000 ?Last data filed at 07/07/2021 0300 ?Gross per 24 hour  ?Intake 360.46 ml  ?Output --  ?Net 360.46 ml  ? ? ?  07/06/2021  ? 12:36 PM 07/06/2021  ? 11:39 AM 11/30/2008  ?  3:42 PM  ?Last 3  Weights  ?Weight (lbs) 157 lb 157 lb 261 lb  ?Weight (kg) 71.215 kg 71.215 kg 118.389 kg  ?   ?Body mass index is 20.71 kg/m?.  ?General:  Well nourished, well developed male appearing in no acute distress ?HEENT: normal ?Neck: no JVD ?Vascular: No carotid bruits; Distal pulses 2+ bilaterally ?Cardiac:  normal S1, S2; Irregularly irregular.  ?Lungs:  clear to auscultation bilaterally, no wheezing, rhonchi or rales  ?Abd: soft, nontender, no hepatomegaly  ?Ext: no edema ?Musculoskeletal:  No deformities, BUE and BLE strength normal and equal ?Skin: warm and dry  ?Neuro:  CNs 2-12 intact, no focal abnormalities noted ?Psych:  Normal affect  ? ?EKG:  The EKG was personally reviewed and  demonstrates: Rate-controlled atrial flutter, heart rate 57 with deep TWI along the precordial leads ? ? ?Telemetry:  Telemetry was personally reviewed and demonstrates: Rate-controlled atrial flutter, HR in the 70's.  ? ?Relevant CV Studies: ? ?None on File. Echocardiogram Pending.  ? ?Laboratory Data: ? ?High Sensitivity Troponin:  No results for input(s): TROPONINIHS in the last 720 hours.   ?Chemistry ?Recent Labs  ?Lab 07/06/21 ?1319 07/07/21 ?0424  ?NA 133* 138  ?K 4.0 3.9  ?CL 97* 103  ?CO2 24 27  ?GLUCOSE 217* 123*  ?BUN 24* 24*  ?CREATININE 1.56* 1.41*  ?CALCIUM 9.3 8.8*  ?MG 2.4  --   ?GFRNONAA 48* 54*  ?ANIONGAP 12 8  ?  ?No results for input(s): PROT, ALBUMIN, AST, ALT, ALKPHOS, BILITOT in the last 168 hours. ?Lipids  ?Recent Labs  ?Lab 07/07/21 ?0424  ?CHOL 157  ?TRIG 109  ?HDL 41  ?[place] 94  ?CHOLHDL 3.8  ?  ?Hematology ?Recent Labs  ?Lab 07/06/21 ?1319 07/07/21 ?0424  ?WBC 5.7 5.9  ?RBC 4.84 4.71  ?HGB 15.4 14.8  ?HCT 45.7 45.0  ?MCV 94.4 95.5  ?MCH 31.8 31.4  ?MCHC 33.7 32.9  ?RDW 12.5 12.6  ?PLT 231 209  ? ?Thyroid  ?Recent Labs  ?Lab 07/06/21 ?1319  ?TSH 1.119  ?  ?BNPNo results for input(s): BNP, PROBNP in the last 168 hours.  ?DDimer No results for input(s): DDIMER in the last 168 hours. ? ? ?Radiology/Studies:  ?DG  Chest 2 View ? ?Result Date: 07/06/2021 ?CLINICAL DATA:  Abdominal pain, nausea. EXAM: CHEST - 2 VIEW COMPARISON:  March 26, 2007. FINDINGS: Stable cardiomediastinal silhouette. Right lung is clear. Stable

## 2021-07-07 NOTE — TOC Progression Note (Signed)
?  Transition of Care (TOC) Screening Note ? ? ?Patient Details  ?Name: Chris Lucas ?Date of Birth: 11/22/1950 ? ? ?Transition of Care (TOC) CM/SW Contact:    ?Boneta Lucks, RN ?Phone Number: ?07/07/2021, 1:02 PM ? ? ? ?Transition of Care Department [facility]) has reviewed patient and no TOC needs have been identified at this time. We will continue to monitor patient advancement through interdisciplinary progression rounds. If new patient transition needs arise, please place a TOC consult. ? ? ? ?  ?Barriers to Discharge: Continued Medical Work up ? ? ?

## 2021-07-08 DIAGNOSIS — R918 Other nonspecific abnormal finding of lung field: Secondary | ICD-10-CM

## 2021-07-08 DIAGNOSIS — I639 Cerebral infarction, unspecified: Secondary | ICD-10-CM

## 2021-07-08 HISTORY — DX: Cerebral infarction, unspecified: I63.9

## 2021-07-08 HISTORY — DX: Other nonspecific abnormal finding of lung field: R91.8

## 2021-07-09 NOTE — ED Provider Notes (Addendum)
?[facility] ? ? ? ?CSN: [id number] ?Arrival date & time: 07/06/21  1119 ? ? ?  ? ?History   ?Chief Complaint ?Chief Complaint  ?Patient presents with  ? Weakness  ? ? ?HPI ?Chris Lucas is a 71 y.o. male.  ? ?Presenting today with wife for evaluation of 2 day history of "just not feeling right". Wife states he had a syncopal episode that lasted 10-20 seconds 2 days ago and has continued to feel very weak, fatigued, nauseated since. Has not had any further syncopal episodes but feels like he could pass out at any moment. Denies notice of CP, SOB, vomiting, visual changes, headache. Has not tried anything OTC for sxs. Hx of HLD, HTN.  ? ? ?Past Medical History:  ?Diagnosis Date  ? Hypertension   ? ? ?Patient Active Problem List  ? Diagnosis Date Noted  ? Acute ischemic stroke ([place]) 07/07/2021  ? Syncope 07/06/2021  ? Lung mass 07/06/2021  ? Thoracic aortic aneurysm ([place]) 07/06/2021  ? Morbid obesity ([place]) 09/26/2007  ? LEG EDEMA, BILATERAL 08/25/2007  ? ALLERGIC RHINITIS, SEASONAL 07/28/2007  ? Hyperlipidemia 07/29/2006  ? Essential hypertension 07/29/2006  ? HERNIA, UMBILICAL 47/65/4650  ? HYPERGLYCEMIA 07/29/2006  ? ? ?History reviewed. No pertinent surgical history. ? ? ? ? ?Home Medications   ? ?Prior to Admission medications   ?Medication Sig Start Date End Date Taking? Authorizing Provider  ?amLODipine (NORVASC) 10 MG tablet Take 10 mg by mouth daily. 12/20/20   [provider]  ?anti-nausea (EMETROL) solution Take 10 mLs by mouth every 15 (fifteen) minutes as needed for nausea or vomiting.    [provider]  ?apixaban (ELIQUIS) 5 MG TABS tablet Take 1 tablet (5 mg total) by mouth 2 (two) times daily. 07/07/21 08/06/21  Darliss Cheney, MD  ?atorvastatin (LIPITOR) 40 MG tablet Take 1 tablet (40 mg total) by mouth daily. 07/08/21 08/07/21  Darliss Cheney, MD  ?bisacodyl 5 MG EC tablet Take 5 mg by mouth daily as needed for moderate constipation.    [provider]  ?furosemide  (LASIX) 20 MG tablet Take 20 mg by mouth daily as needed for fluid. 11/22/20   [provider]  ?olmesartan (BENICAR) 40 MG tablet Take 40 mg by mouth daily. 11/05/20   [provider]  ?ondansetron (ZOFRAN-ODT) 4 MG disintegrating tablet Take 1 tablet (4 mg total) by mouth every 8 (eight) hours as needed for nausea or vomiting. 07/07/21   Darliss Cheney, MD  ? ? ?Family History ?Family History  ?Problem Relation Age of Onset  ? Diabetes Mother   ? ? ?Social History ?Social History  ? ?Tobacco Use  ? Smoking status: Never  ? Smokeless tobacco: Never  ?Vaping Use  ? Vaping Use: Never used  ?Substance Use Topics  ? Alcohol use: Not Currently  ? Drug use: Not Currently  ? ? ? ?Allergies   ?Ivp dye [iodinated contrast media] ? ? ?Review of Systems ?Review of Systems ?PER HPI ? ?Physical Exam ?Triage Vital Signs ?ED Triage Vitals  ?Enc Vitals Group  ?   BP 07/06/21 1138 138/90  ?   Pulse Rate 07/06/21 1138 72  ?   Resp 07/06/21 1138 18  ?   Temp 07/06/21 1138 98.2 ?F (36.8 ?C)  ?   Temp Source 07/06/21 1138 Oral  ?   SpO2 07/06/21 1138 98 %  ?   Weight 07/06/21 1139 157 lb (71.2 kg)  ?   Height 07/06/21 1139 6\' 1"  (1.854 m)  ?  Head Circumference --   ?   Peak Flow --   ?   Pain Score 07/06/21 1138 10  ?   Pain Loc --   ?   Pain Edu? --   ?   Excl. in [place]? --   ? ?No data found. ? ?Updated Vital Signs ?BP 138/90 (BP Location: Right Arm)   Pulse 72   Temp 98.2 ?F (36.8 ?C) (Oral)   Resp 18   Ht 6\' 1"  (1.854 m)   Wt 157 lb (71.2 kg)   SpO2 98%   BMI 20.71 kg/m?  ? ?Visual Acuity ?Right Eye Distance:   ?Left Eye Distance:   ?Bilateral Distance:   ? ?Right Eye Near:   ?Left Eye Near:    ?Bilateral Near:    ? ?Physical Exam ?Vitals and nursing note reviewed.  ?Constitutional:   ?   Comments: Appears weak, mildly lethargic  ?HENT:  ?   Head: Atraumatic.  ?   Mouth/Throat:  ?   Mouth: Mucous membranes are moist.  ?Eyes:  ?   Extraocular Movements: Extraocular movements intact.  ?   Conjunctiva/sclera:  Conjunctivae normal.  ?Cardiovascular:  ?   Rate and Rhythm: Normal rate. Rhythm irregular.  ?Pulmonary:  ?   Effort: Pulmonary effort is normal.  ?   Breath sounds: Normal breath sounds.  ?Musculoskeletal:     ?   General: Normal range of motion.  ?   Cervical back: Normal range of motion and neck supple.  ?Skin: ?   General: Skin is warm and dry.  ?Neurological:  ?   General: No focal deficit present.  ?   Mental Status: He is oriented to person, place, and time.  ?   Motor: No weakness.  ?   Gait: Gait normal.  ?Psychiatric:  ?   Comments: Mildly anxious  ? ? ? ?UC Treatments / Results  ?Labs ?(all labs ordered are listed, but only abnormal results are displayed) ?Labs Reviewed - No data to display ? ?EKG ? ? ?Radiology ?No results found. ? ?Procedures ?Procedures (including critical care time) ? ?Medications Ordered in UC ?Medications - No data to display ? ?Initial Impression / Assessment and Plan / UC Course  ?I have reviewed the triage vital signs and the nursing notes. ? ?Pertinent labs & imaging results that were available during my care of the patient were reviewed by me and considered in my medical decision making (see chart for details). ? ?  ? ?Vital signs in triage overall WNL, but patient ill appearing with new syncopal episode and ongoing concerning sxs, EKG today showing new atrial flutter. EMS called with patient's consent for transport to ED for further evaluation.  ? ?Final Clinical Impressions(s) / UC Diagnoses  ? ?Final diagnoses:  ?Syncope, unspecified syncope type  ?Weakness  ?Other fatigue  ?Atrial flutter, unspecified type ([place])  ? ?Discharge Instructions   ?None ?  ? ?ED Prescriptions   ?None ?  ? ?PDMP not reviewed this encounter. ?  ?Volney American, PA-C ?07/09/21 2208 ? ?  ?Volney American, [place] ?07/09/21 2209 ? ?

## 2021-07-12 ENCOUNTER — Ambulatory Visit (INDEPENDENT_AMBULATORY_CARE_PROVIDER_SITE_OTHER): Payer: Medicare HMO | Admitting: Emergency Medicine

## 2021-07-12 ENCOUNTER — Encounter: Payer: Self-pay | Admitting: Emergency Medicine

## 2021-07-12 VITALS — BP 142/82 | HR 76 | Temp 98.7°F | Ht 73.0 in | Wt 241.0 lb

## 2021-07-12 DIAGNOSIS — R918 Other nonspecific abnormal finding of lung field: Secondary | ICD-10-CM

## 2021-07-12 NOTE — Assessment & Plan Note (Signed)
Reviewed the CT scan with the patient and his wife.  He has a lobulated solid left upper lobe mass, concerning for primary lung cancer although other etiologies possible.  I recommended navigational bronchoscopy as soon as possible, discussed the pros, cons, risk and benefits with him today.  He will have to stop his Eliquis for 2 days.  He is willing to proceed.  I will work on arranging ? ? ?We reviewed your CT scan of the chest today. ?We will work on arranging a bronchoscopy to evaluate your left lung mass.  This will be done as an outpatient under general anesthesia at [facility] endoscopy.  You will need a designated driver.  You will need to stop your Eliquis 2 days prior to the procedure.  We will try to get this scheduled for either 07/24/2021 or 07/31/2021. ?Follow Dr. Lamonte Sakai in 1 month or next available ?

## 2021-07-12 NOTE — Progress Notes (Signed)
? ? ?Subjective:  ? ? Patient ID: Chris Lucas, male    DOB: September 25, 1950, 71 y.o.   MRN: [id number] ? ?HPI ? ?71 year old never smoker with a history of hypertension.  He was seen in the independent emergency department and was admitted to [facility] with nausea, fatigue, weakness and decreased appetite.  He was in atrial flutter and was found to have a acute/subacute CVA.  Part of his evaluation included chest x-ray and subsequently a CT scan of the chest as below.  He was started on Eliquis.  ?He is referred for an abnormal CT scan of the chest. ?He reports that he is feeling well. No CP. No cough.  ? ?CT chest 07/06/2021 reviewed by me shows a macrolobulated solid left upper lobe mass 3.5 x 3.1 cm.  There is some bibasilar atelectasis and a tiny calcified lingular granuloma ? ?MRI brain 07/06/2021 showed 4 mm acute nonischemic infarct.  No evidence of metastatic disease ? ?Review of Systems ?As per HPI ? ?Past Medical History:  ?Diagnosis Date  ? Hypertension   ?  ? ?Family History  ?Problem Relation Age of Onset  ? Diabetes Mother   ?  ? ?Social History  ? ?Socioeconomic History  ? Marital status: Married  ?  Spouse name: Not on file  ? Number of children: Not on file  ? Years of education: Not on file  ? Highest education level: Not on file  ?Occupational History  ? Not on file  ?Tobacco Use  ? Smoking status: Never  ? Smokeless tobacco: Never  ?Vaping Use  ? Vaping Use: Never used  ?Substance and Sexual Activity  ? Alcohol use: Not Currently  ? Drug use: Not Currently  ? Sexual activity: Not Currently  ?Other Topics Concern  ? Not on file  ?Social History Narrative  ? Not on file  ? ?Social Determinants of Health  ? ?Financial Resource Strain: Not on file  ?Food Insecurity: Not on file  ?Transportation Needs: Not on file  ?Physical Activity: Not on file  ?Stress: Not on file  ?Social Connections: Not on file  ?Intimate Partner Violence: Not on file  ?  ? ?Allergies  ?Allergen Reactions  ? Ivp Dye [Iodinated  Contrast Media]   ?  ? ?Outpatient Medications Prior to Visit  ?Medication Sig Dispense Refill  ? amLODipine (NORVASC) 10 MG tablet Take 10 mg by mouth daily.    ? anti-nausea (EMETROL) solution Take 10 mLs by mouth every 15 (fifteen) minutes as needed for nausea or vomiting.    ? apixaban (ELIQUIS) 5 MG TABS tablet Take 1 tablet (5 mg total) by mouth 2 (two) times daily. 60 tablet 0  ? atorvastatin (LIPITOR) 40 MG tablet Take 1 tablet (40 mg total) by mouth daily. 30 tablet 0  ? bisacodyl (DULCOLAX) 5 MG EC tablet Take 5 mg by mouth daily as needed for moderate constipation.    ? olmesartan (BENICAR) 40 MG tablet Take 40 mg by mouth daily.    ? ondansetron (ZOFRAN-ODT) 4 MG disintegrating tablet Take 1 tablet (4 mg total) by mouth every 8 (eight) hours as needed for nausea or vomiting. 20 tablet 0  ? furosemide (LASIX) 20 MG tablet Take 20 mg by mouth daily as needed for fluid. (Patient not taking: Reported on 07/12/2021)    ? ?No facility-administered medications prior to visit.  ? ? ? ? ?   ?Objective:  ? Physical Exam ?Vitals:  ? 07/12/21 1504  ?BP: (!) 142/82  ?Pulse: 76  ?  Temp: 98.7 ?F (37.1 ?C)  ?TempSrc: Oral  ?SpO2: 99%  ?Weight: 241 lb (109.3 kg)  ?Height: 6\' 1"  (1.854 m)  ? ?Gen: Pleasant, well-nourished, in no distress,  normal affect ? ?ENT: No lesions,  mouth clear,  oropharynx clear, no postnasal drip ? ?Neck: No JVD, no stridor ? ?Lungs: No use of accessory muscles, no crackles or wheezing on normal respiration, no wheeze on forced expiration ? ?Cardiovascular: RRR, heart sounds normal, no murmur or gallops, no peripheral edema ? ?Musculoskeletal: No deformities, no cyanosis or clubbing ? ?Neuro: alert, awake, non focal ? ?Skin: Warm, no lesions or rash ? ? ?   ?Assessment & Plan:  ?Lung mass ?Reviewed the CT scan with the patient and his wife.  He has a lobulated solid left upper lobe mass, concerning for primary lung cancer although other etiologies possible.  I recommended navigational bronchoscopy  as soon as possible, discussed the pros, cons, risk and benefits with him today.  He will have to stop his Eliquis for 2 days.  He is willing to proceed.  I will work on arranging ? ? ?We reviewed your CT scan of the chest today. ?We will work on arranging a bronchoscopy to evaluate your left lung mass.  This will be done as an outpatient under general anesthesia at [facility] endoscopy.  You will need a designated driver.  You will need to stop your Eliquis 2 days prior to the procedure.  We will try to get this scheduled for either 07/24/2021 or 07/31/2021. ?Follow Dr. Lamonte Sakai in 1 month or next available ? ? ?Baltazar Apo, MD, PhD ?07/12/2021, 5:19 PM ?[facility] ?[phone] or if no answer before 7:00PM call [phone] ?For any issues after 7:00PM please call eLink [phone] ? ? ?

## 2021-07-12 NOTE — H&P (View-Only) (Signed)
? ? ?Subjective:  ? ? Patient ID: Chris Lucas, male    DOB: Jan 18, 1951, 71 y.o.   MRN: [id number] ? ?HPI ? ?71 year old never smoker with a history of hypertension.  He was seen in the independent emergency department and was admitted to [facility] with nausea, fatigue, weakness and decreased appetite.  He was in atrial flutter and was found to have a acute/subacute CVA.  Part of his evaluation included chest x-ray and subsequently a CT scan of the chest as below.  He was started on Eliquis.  ?He is referred for an abnormal CT scan of the chest. ?He reports that he is feeling well. No CP. No cough.  ? ?CT chest 07/06/2021 reviewed by me shows a macrolobulated solid left upper lobe mass 3.5 x 3.1 cm.  There is some bibasilar atelectasis and a tiny calcified lingular granuloma ? ?MRI brain 07/06/2021 showed 4 mm acute nonischemic infarct.  No evidence of metastatic disease ? ?Review of Systems ?As per HPI ? ?Past Medical History:  ?Diagnosis Date  ? Hypertension   ?  ? ?Family History  ?Problem Relation Age of Onset  ? Diabetes Mother   ?  ? ?Social History  ? ?Socioeconomic History  ? Marital status: Married  ?  Spouse name: Not on file  ? Number of children: Not on file  ? Years of education: Not on file  ? Highest education level: Not on file  ?Occupational History  ? Not on file  ?Tobacco Use  ? Smoking status: Never  ? Smokeless tobacco: Never  ?Vaping Use  ? Vaping Use: Never used  ?Substance and Sexual Activity  ? Alcohol use: Not Currently  ? Drug use: Not Currently  ? Sexual activity: Not Currently  ?Other Topics Concern  ? Not on file  ?Social History Narrative  ? Not on file  ? ?Social Determinants of Health  ? ?Financial Resource Strain: Not on file  ?Food Insecurity: Not on file  ?Transportation Needs: Not on file  ?Physical Activity: Not on file  ?Stress: Not on file  ?Social Connections: Not on file  ?Intimate Partner Violence: Not on file  ?  ? ?Allergies  ?Allergen Reactions  ? Ivp Dye [Iodinated  Contrast Media]   ?  ? ?Outpatient Medications Prior to Visit  ?Medication Sig Dispense Refill  ? amLODipine (NORVASC) 10 MG tablet Take 10 mg by mouth daily.    ? anti-nausea (EMETROL) solution Take 10 mLs by mouth every 15 (fifteen) minutes as needed for nausea or vomiting.    ? apixaban (ELIQUIS) 5 MG TABS tablet Take 1 tablet (5 mg total) by mouth 2 (two) times daily. 60 tablet 0  ? atorvastatin (LIPITOR) 40 MG tablet Take 1 tablet (40 mg total) by mouth daily. 30 tablet 0  ? bisacodyl (DULCOLAX) 5 MG EC tablet Take 5 mg by mouth daily as needed for moderate constipation.    ? olmesartan (BENICAR) 40 MG tablet Take 40 mg by mouth daily.    ? ondansetron (ZOFRAN-ODT) 4 MG disintegrating tablet Take 1 tablet (4 mg total) by mouth every 8 (eight) hours as needed for nausea or vomiting. 20 tablet 0  ? furosemide (LASIX) 20 MG tablet Take 20 mg by mouth daily as needed for fluid. (Patient not taking: Reported on 07/12/2021)    ? ?No facility-administered medications prior to visit.  ? ? ? ? ?   ?Objective:  ? Physical Exam ?Vitals:  ? 07/12/21 1504  ?BP: (!) 142/82  ?Pulse: 76  ?  Temp: 98.7 ?F (37.1 ?C)  ?TempSrc: Oral  ?SpO2: 99%  ?Weight: 241 lb (109.3 kg)  ?Height: 6\' 1"  (1.854 m)  ? ?Gen: Pleasant, well-nourished, in no distress,  normal affect ? ?ENT: No lesions,  mouth clear,  oropharynx clear, no postnasal drip ? ?Neck: No JVD, no stridor ? ?Lungs: No use of accessory muscles, no crackles or wheezing on normal respiration, no wheeze on forced expiration ? ?Cardiovascular: RRR, heart sounds normal, no murmur or gallops, no peripheral edema ? ?Musculoskeletal: No deformities, no cyanosis or clubbing ? ?Neuro: alert, awake, non focal ? ?Skin: Warm, no lesions or rash ? ? ?   ?Assessment & Plan:  ?Lung mass ?Reviewed the CT scan with the patient and his wife.  He has a lobulated solid left upper lobe mass, concerning for primary lung cancer although other etiologies possible.  I recommended navigational bronchoscopy  as soon as possible, discussed the pros, cons, risk and benefits with him today.  He will have to stop his Eliquis for 2 days.  He is willing to proceed.  I will work on arranging ? ? ?We reviewed your CT scan of the chest today. ?We will work on arranging a bronchoscopy to evaluate your left lung mass.  This will be done as an outpatient under general anesthesia at [facility] endoscopy.  You will need a designated driver.  You will need to stop your Eliquis 2 days prior to the procedure.  We will try to get this scheduled for either 07/24/2021 or 07/31/2021. ?Follow Dr. Lamonte Sakai in 1 month or next available ? ? ?Baltazar Apo, MD, PhD ?07/12/2021, 5:19 PM ?[facility] ?[phone] or if no answer before 7:00PM call [phone] ?For any issues after 7:00PM please call eLink [phone] ? ? ?

## 2021-07-12 NOTE — Patient Instructions (Signed)
We reviewed your CT scan of the chest today. ?We will work on arranging a bronchoscopy to evaluate your left lung mass.  This will be done as an outpatient under general anesthesia at [facility] endoscopy.  You will need a designated driver.  You will need to stop your Eliquis 2 days prior to the procedure.  We will try to get this scheduled for either 07/24/2021 or 07/31/2021. ?Follow Dr. Lamonte Sakai in 1 month or next available ?

## 2021-07-13 DIAGNOSIS — Z8673 Personal history of transient ischemic attack (TIA), and cerebral infarction without residual deficits: Secondary | ICD-10-CM | POA: Diagnosis not present

## 2021-07-13 DIAGNOSIS — E1165 Type 2 diabetes mellitus with hyperglycemia: Secondary | ICD-10-CM | POA: Diagnosis not present

## 2021-07-13 DIAGNOSIS — N1831 Chronic kidney disease, stage 3a: Secondary | ICD-10-CM | POA: Diagnosis not present

## 2021-07-13 DIAGNOSIS — Z6832 Body mass index (BMI) 32.0-32.9, adult: Secondary | ICD-10-CM | POA: Diagnosis not present

## 2021-07-13 DIAGNOSIS — I4892 Unspecified atrial flutter: Secondary | ICD-10-CM | POA: Diagnosis not present

## 2021-07-13 DIAGNOSIS — R918 Other nonspecific abnormal finding of lung field: Secondary | ICD-10-CM | POA: Diagnosis not present

## 2021-07-13 DIAGNOSIS — I712 Thoracic aortic aneurysm, without rupture, unspecified: Secondary | ICD-10-CM | POA: Diagnosis not present

## 2021-07-13 DIAGNOSIS — I10 Essential (primary) hypertension: Secondary | ICD-10-CM | POA: Diagnosis not present

## 2021-07-20 ENCOUNTER — Encounter (HOSPITAL_COMMUNITY): Payer: Self-pay | Admitting: Emergency Medicine

## 2021-07-21 ENCOUNTER — Other Ambulatory Visit: Payer: Self-pay | Admitting: Emergency Medicine

## 2021-07-21 ENCOUNTER — Other Ambulatory Visit: Payer: Self-pay

## 2021-07-21 ENCOUNTER — Encounter (HOSPITAL_COMMUNITY): Payer: Self-pay | Admitting: Emergency Medicine

## 2021-07-21 LAB — SARS CORONAVIRUS 2 (TAT 6-24 HRS): SARS Coronavirus 2: NEGATIVE

## 2021-07-21 NOTE — Progress Notes (Signed)
Anesthesia Chart Review: SAME DAY WORK-UP ? Case: [id number] Date/Time: 07/24/21 1330  ? Procedure: ROBOTIC ASSISTED NAVIGATIONAL BRONCHOSCOPY  ? Anesthesia type: General  ? Pre-op diagnosis: LEFT UPPER LOPE MASS  ? Location: [facility] ROOM 3 / [facility]  ? Surgeons: Collene Gobble, MD  ? ?  ? ? ?DISCUSSION: Patient is a 71 year old male scheduled for the above procedure. ? ?History includes never smoker, HTN, HLD, CVA (07/06/21), LUL mass (07/06/21), atrial flutter (07/06/21).  ? ?[facility] admission 07/06/21-07/07/21. He presented to the urgent care with nausea with decreased appetite, fatigue, generalized weakness with brief syncope on 07/04/21. Heart rhythm irregular. No chest pain. EMS was contacted for triage to the [facility] ED. EKG showed rate controlled atrial flutter. CT imaging also revealed a LUL lung mass and 4.8 cm TAA. 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 He was admitted per Hospitalist Service with pulmonology Chesley Mires, MD) and cardiology Harl Bowie, Roderic Palau, MD) consults. He was started on full-dose Lovenox instead of a DOAC at the time of admission in case of further evaluation of his lung mass during admission. Dr. Harl Bowie noted, "The Hospitalist did review his case with Neurology and they recommended full anticoagulation as opposed to ASA given his stroke was likely due to newly diagnosed atrial flutter." Once timing of surgery for lung mass determined Dr. Harl Bowie recommended switching to Eliquis (CHA2DS2-VASc Score of 5). If echo did not show any structural abnormalities, he did not anticipate further cardiac work-up at that time, but did recommended repeat imaging of TAA in six months. 07/07/21 echo showed LVEF 55-60%, no RWMA, moderate LVH, normal RVSF, LA moderately dilated, 39 mm ascending aorta, no significant valvular regurgitation or stenosis. He passed ST/PT screenings and did not  require further therapies. Dr. Halford Chessman recommended out-patient pulmonology follow-up for bronchoscopy consideration. Wife also was interested in future sleep study. Out-patient neurology ([facility]) follow-up in 4 weeks recommended. Discharge medications included amlodipine, Eliquis, Lipitor, olmesartan.  ? ?His neurology follow-up is scheduled for 08/10/21 with Alric Ran, MD. He has CT surgery evaluation with Melodie Bouillon, MD on 08/18/21 for abnormal chest CT (TAA, lung mass). He already saw pulmonologist Baltazar Apo, MD on 4/523 who wrote, "He has a lobulated solid left upper lobe mass, concerning for primary lung cancer although other etiologies possible.  I recommended navigational bronchoscopy as soon as possible, discussed the pros, cons, risk and benefits with him today.  He will have to stop his Eliquis for 2 days.  He is willing to proceed."  Last Eliquis dose is scheduled for 07/21/21.  ? ?Discussed above including recent admission with anesthesiologist Renold Don, MD. Anesthesia team to evaluate on the day of surgery.  ? ? ?VS: Ht 6\' 1"  (1.854 m)   Wt 109 kg   BMI 31.70 kg/m?  ?BP Readings from Last 3 Encounters:  ?07/12/21 (!) 142/82  ?07/07/21 (!) 146/95  ?07/06/21 138/90  ? ?Pulse Readings from Last 3 Encounters:  ?07/12/21 76  ?07/07/21 75  ?07/06/21 72  ?  ? ?PROVIDERS: ?[name], MD is PCP  ?See DISCUSSION.  ? ?LABS: Most recent labs results in [facility] include: ?Lab Results  ?Component Value Date  ? WBC 5.9 07/07/2021  ? HGB 14.8 07/07/2021  ? HCT 45.0 07/07/2021  ? PLT 209 07/07/2021  ? GLUCOSE 123 (H) 07/07/2021  ? CHOL 157 07/07/2021  ? TRIG 109 07/07/2021  ?  HDL 41 07/07/2021  ? [place] 94 07/07/2021  ? NA 138 07/07/2021  ? K 3.9 07/07/2021  ? CL 103 07/07/2021  ? CREATININE 1.41 (H) 07/07/2021  ? BUN 24 (H) 07/07/2021  ? CO2 27 07/07/2021  ? TSH 1.119 07/06/2021  ? HGBA1C 7.4 (H) 07/07/2021  ? ? ?IMAGES: ?MRA Head/Brain 07/07/21: ?IMPRESSION: ?MRA head: ?1.  Artifact limits evaluation of the distal M1 and proximal M2 ?middle cerebral arteries, bilaterally. ?2. Artifact also limits evaluation of the P2 posterior cerebral ?arteries, bilaterally. ?3. No intracranial large vessel occlusion or proximal high-grade ?arterial stenosis identified. ?4. Mild atherosclerotic irregularity of the intracranial internal ?carotid arteries. ?5. Anterior and posterior circulation dolichoectasia, suggesting ?longstanding hypertension. ?  ?MRA neck: ?1. The common carotid and internal carotid arteries are patent ?within the neck without appreciable stenosis. ?2. The vertebral arteries are codominant and patent within the neck. ?A portion of the distal cervical left vertebral artery is excluded ?from the field of view. Within this limitation, there is no ?appreciable cervical vertebral artery stenosis. ?  ? ?MRI Head 07/06/21: ?IMPRESSION: ?1. 4 mm acute ischemic nonhemorrhagic infarct involving the inferior ?right cerebellum. ?2. No other acute intracranial abnormality. No evidence for ?metastatic disease. ?3. Underlying moderate chronic microvascular ischemic disease. ? ? ?CT Chest 07/06/21: ?IMPRESSION: ?- Macrolobulated solid mass in LEFT upper lobe abutting the major ?fissure, 32 mm diameter, highly concerning for a pulmonary neoplasm; ?Recommend a PET/CT or tissue sampling. ?  ?These guidelines do not apply to immunocompromised patients and ?patients with cancer. Follow up in patients with significant ?comorbidities as clinically warranted. For lung cancer screening, ?adhere to Lung-RADS guidelines. Reference: Radiology. 2017; ?284(1):228-43. ?  ?- Aneurysmal dilatation of ascending thoracic aorta 4.8 cm transverse; ?Ascending thoracic aortic aneurysm. Recommend semi-annual imaging ?followup by CTA or MRA and referral to cardiothoracic surgery if not ?already obtained. This recommendation follows 2010 ?ACCF/AHA/AATS/ACR/ASA/SCA/SCAI/SIR/STS/SVM Guidelines for the ?Diagnosis and  Management of Patients With Thoracic Aortic Disease. ?Circulation. 2010; 121: A630-Z601. Aortic aneurysm NOS (ICD10-I71.9) ?  ?- Bibasilar atelectasis. ? ? ?CXR 07/06/21: ?IMPRESSION: ?Elevated left hemidiaphragm is noted with mild left basilar ?subsegmental atelectasis. Rounded nodular density seen in left lung ?base concerning for neoplasm; CT scan of the chest with intravenous ?contrast is recommended for further evaluation. ?  ? ? ?EKG: ?EKG 07/06/21 12:41:25: ?Vent. Rate 68 bpm ?Atrial flutter with predominant 4:1 AV block ?Repol abnrm, global ischemia, diffuse leads ?Prolonged QT interval [QT 499, QTcB 531 ms] ?Confirmed by Milton Ferguson [phone]) on 07/06/2021 2:08:00 PM ? ?EKG 07/06/21 11:53:13: ?Atrial flutter with variable A-V block ?ST & Marked T wave abnormality, consider anterolateral ischemia ?Abnormal ECG ?Since previous tracing Atrial flutter with variable A-V block has replaced Normal sinus rhythm ?Otherwise no significant change ?Confirmed by Glenetta Hew [phone]) on 07/07/2021 10:30:57 PM ? ? ?CV: ?Echo 07/07/21: ?IMPRESSIONS  ? 1. Left ventricular ejection fraction, by estimation, is 55 to 60%. The  ?left ventricle has normal function. The left ventricle has no regional  ?wall motion abnormalities. There is moderate left ventricular hypertrophy.  ?Left ventricular diastolic  ?parameters are indeterminate.  ? 2. Right ventricular systolic function is normal. The right ventricular  ?size is normal.  ? 3. Left atrial size was moderately dilated.  ? 4. The mitral valve is normal in structure. No evidence of mitral valve  ?regurgitation. No evidence of mitral stenosis.  ? 5. The aortic valve is tricuspid. There is mild calcification of the  ?aortic valve. There is mild thickening of the aortic valve.  Aortic valve  ?regurgitation is not visualized. No aortic stenosis is present.  ? 6. Aortic dilatation noted. There is mild dilatation of the ascending  ?aorta, measuring 39 mm.  ? 7. The inferior vena cava is  normal in size with greater than 50%  ?respiratory variability, suggesting right atrial pressure of 3 mmHg.  ? ? ?Cardiac cath 07/13/04: ? INTERPRETATION: ? 1.  Normal coronary arteries. ? 2.  Severe contrast reaction.

## 2021-07-21 NOTE — Progress Notes (Signed)
TWO VISITORS ARE ALLOWED TO COME WITH YOU AND STAY IN THE SURGICAL WAITING ROOM ONLY DURING PRE OP AND PROCEDURE DAY OF SURGERY.  ? ?PCP - Dr Allyn Kenner ?Cardiologist - n/a ? ?Chest x-ray - 07/06/21 (2V) ?EKG - 07/06/21 ?Stress Test - Yrs ago ?ECHO - 07/07/21 ?Cardiac Cath - Yrs ago ? ?ICD Pacemaker/Loop - n/a ? ?Sleep Study -  n/a ?CPAP - none ? ?Blood Thinner Instructions:  Follow your surgeon's instructions on when to stop Eliquis prior to surgery.  Last dose will be on 07/21/21. ? ?Anesthesia review: Yes ? ?STOP now taking any Aspirin (unless otherwise instructed by your surgeon), Aleve, Naproxen, Ibuprofen, Motrin, Advil, Goody's, BC's, all herbal medications, fish oil, and all vitamins.  ? ?Coronavirus Screening ?Covid Test on 07/21/21, ?Do you have any of the following symptoms:  ?Cough yes/no: No ?Fever (>100.22F)  yes/no: No ?Runny nose yes/no: No ?Sore throat yes/no: No ?Difficulty breathing/shortness of breath  yes/no: No ? ?Have you traveled in the last 14 days and where? yes/no: No ? ?Patient/ wife Jeralyn Ruths verbalized understanding of instructions that were given via speaker phone. ?

## 2021-07-21 NOTE — Anesthesia Preprocedure Evaluation (Addendum)
Anesthesia Evaluation  ?Patient identified by MRN, date of birth, ID band ?Patient awake ? ? ? ?Reviewed: ?Allergy & Precautions, NPO status , Patient's Chart, lab work & pertinent test results ? ?History of Anesthesia Complications ?Negative for: history of anesthetic complications ? ?Airway ?Mallampati: III ? ?TM Distance: >3 FB ?Neck ROM: Full ? ? ? Dental ? ?(+) Teeth Intact, Dental Advisory Given ?  ?Pulmonary ? ?LEFT UPPER LOPE MASS ?  ?breath sounds clear to auscultation ? ? ? ? ? ? Cardiovascular ?hypertension, Pt. on medications ?+ dysrhythmias Atrial Fibrillation  ?Rhythm:Regular  ?Echo 07/07/21: ?IMPRESSIONS  ??1. Left ventricular ejection fraction, by estimation, is 55 to 60%. The  ?left ventricle has normal function. The left ventricle has no regional  ?wall motion abnormalities. There is moderate left ventricular hypertrophy.  ?Left ventricular diastolic  ?parameters are indeterminate.  ??2. Right ventricular systolic function is normal. The right ventricular  ?size is normal.  ??3. Left atrial size was moderately dilated.  ??4. The mitral valve is normal in structure. No evidence of mitral valve  ?regurgitation. No evidence of mitral stenosis.  ??5. The aortic valve is tricuspid. There is mild calcification of the  ?aortic valve. There is mild thickening of the aortic valve. Aortic valve  ?regurgitation is not visualized. No aortic stenosis is present.  ??6. Aortic dilatation noted. There is mild dilatation of the ascending  ?aorta, measuring 39 mm.  ??7. The inferior vena cava is normal in size with greater than 50%  ?respiratory variability, suggesting right atrial pressure of 3 mmHg.  ? ?  ?Neuro/Psych ?CVA negative psych ROS  ? GI/Hepatic ?negative GI ROS, Neg liver ROS,   ?Endo/Other  ?negative endocrine ROS ? Renal/GU ?Lab Results ?     Component                Value               Date                 ?     CREATININE               1.41 (H)            07/07/2021            ?  ? ?  ?Musculoskeletal ?negative musculoskeletal ROS ?(+)  ? Abdominal ?  ?Peds ? Hematology ?negative hematology ROS ?(+)   ?Anesthesia Other Findings ?Thoracic ascending aortic aneurysm  ? Reproductive/Obstetrics ? ?  ? ? ? ? ? ? ? ? ? ? ? ? ? ?  ?  ? ? ? ? ? ? ? ?Anesthesia Physical ?Anesthesia Plan ? ?ASA: 3 ? ?Anesthesia Plan: General  ? ?Post-op Pain Management: Minimal or no pain anticipated  ? ?Induction: Intravenous ? ?PONV Risk Score and Plan: 2 and Dexamethasone and Ondansetron ? ?Airway Management Planned: Oral ETT ? ?Additional Equipment: None ? ?Intra-op Plan:  ? ?Post-operative Plan: Extubation in OR ? ?Informed Consent: I have reviewed the patients History and Physical, chart, labs and discussed the procedure including the risks, benefits and alternatives for the proposed anesthesia with the patient or authorized representative who has indicated his/her understanding and acceptance.  ? ? ? ?Dental advisory given ? ?Plan Discussed with: CRNA ? ?Anesthesia Plan Comments: (See PAT note written 07/21/2021 by Myra Gianotti, PA-C. ?)  ? ? ? ? ? ?Anesthesia Quick Evaluation ? ?

## 2021-07-24 ENCOUNTER — Ambulatory Visit (HOSPITAL_COMMUNITY): Payer: Medicare HMO

## 2021-07-24 ENCOUNTER — Encounter (HOSPITAL_COMMUNITY)
Admission: RE | Disposition: A | Discharge status: Home / Self Care | Payer: Self-pay | Source: Ambulatory Visit | Attending: Emergency Medicine

## 2021-07-24 ENCOUNTER — Ambulatory Visit (HOSPITAL_BASED_OUTPATIENT_CLINIC_OR_DEPARTMENT_OTHER): Payer: Medicare HMO | Admitting: Vascular Surgery

## 2021-07-24 ENCOUNTER — Ambulatory Visit (HOSPITAL_COMMUNITY)
Admission: RE | Admit: 2021-07-24 | Discharge: 2021-07-24 | Disposition: A | Discharge status: Home / Self Care | Payer: Medicare HMO | Source: Ambulatory Visit | Attending: Emergency Medicine | Admitting: Emergency Medicine

## 2021-07-24 ENCOUNTER — Encounter (HOSPITAL_COMMUNITY): Payer: Self-pay | Admitting: Emergency Medicine

## 2021-07-24 ENCOUNTER — Other Ambulatory Visit: Payer: Self-pay

## 2021-07-24 ENCOUNTER — Ambulatory Visit (HOSPITAL_COMMUNITY): Payer: Medicare HMO | Admitting: Vascular Surgery

## 2021-07-24 DIAGNOSIS — F418 Other specified anxiety disorders: Secondary | ICD-10-CM

## 2021-07-24 DIAGNOSIS — D3A00 Benign carcinoid tumor of unspecified site: Secondary | ICD-10-CM | POA: Diagnosis present

## 2021-07-24 DIAGNOSIS — I10 Essential (primary) hypertension: Secondary | ICD-10-CM | POA: Insufficient documentation

## 2021-07-24 DIAGNOSIS — E785 Hyperlipidemia, unspecified: Secondary | ICD-10-CM | POA: Insufficient documentation

## 2021-07-24 DIAGNOSIS — I4892 Unspecified atrial flutter: Secondary | ICD-10-CM | POA: Insufficient documentation

## 2021-07-24 DIAGNOSIS — I7121 Aneurysm of the ascending aorta, without rupture: Secondary | ICD-10-CM | POA: Diagnosis not present

## 2021-07-24 DIAGNOSIS — Z8673 Personal history of transient ischemic attack (TIA), and cerebral infarction without residual deficits: Secondary | ICD-10-CM | POA: Insufficient documentation

## 2021-07-24 DIAGNOSIS — I4891 Unspecified atrial fibrillation: Secondary | ICD-10-CM | POA: Diagnosis not present

## 2021-07-24 DIAGNOSIS — C7A090 Malignant carcinoid tumor of the bronchus and lung: Secondary | ICD-10-CM | POA: Diagnosis not present

## 2021-07-24 DIAGNOSIS — R918 Other nonspecific abnormal finding of lung field: Secondary | ICD-10-CM

## 2021-07-24 DIAGNOSIS — J9811 Atelectasis: Secondary | ICD-10-CM | POA: Diagnosis not present

## 2021-07-24 HISTORY — PX: BRONCHIAL BRUSHINGS: SHX5108

## 2021-07-24 HISTORY — PX: VIDEO BRONCHOSCOPY WITH RADIAL ENDOBRONCHIAL ULTRASOUND: SHX6849

## 2021-07-24 HISTORY — PX: BRONCHIAL NEEDLE ASPIRATION BIOPSY: SHX5106

## 2021-07-24 HISTORY — DX: Hyperlipidemia, unspecified: E78.5

## 2021-07-24 HISTORY — DX: Prediabetes: R73.03

## 2021-07-24 HISTORY — DX: Aneurysm of the ascending aorta, without rupture: I71.21

## 2021-07-24 HISTORY — PX: BRONCHIAL BIOPSY: SHX5109

## 2021-07-24 SURGERY — BRONCHOSCOPY, WITH BIOPSY USING ELECTROMAGNETIC NAVIGATION
Anesthesia: General

## 2021-07-24 MED ORDER — PHENYLEPHRINE 40 MCG/ML (10ML) SYRINGE FOR IV PUSH (FOR BLOOD PRESSURE SUPPORT)
PREFILLED_SYRINGE | INTRAVENOUS | Status: DC | PRN
  Administered 2021-07-24: 120 ug via INTRAVENOUS

## 2021-07-24 MED ORDER — PROPOFOL 10 MG/ML IV BOLUS
INTRAVENOUS | Status: DC | PRN
  Administered 2021-07-24: 200 mg via INTRAVENOUS

## 2021-07-24 MED ORDER — ESMOLOL HCL 100 MG/10ML IV SOLN
INTRAVENOUS | Status: DC | PRN
  Administered 2021-07-24: 40 mg via INTRAVENOUS
  Administered 2021-07-24: 10 mg via INTRAVENOUS

## 2021-07-24 MED ORDER — SUGAMMADEX SODIUM 200 MG/2ML IV SOLN
INTRAVENOUS | Status: DC | PRN
  Administered 2021-07-24: 300 mg via INTRAVENOUS

## 2021-07-24 MED ORDER — CHLORHEXIDINE GLUCONATE 0.12 % MT SOLN
OROMUCOSAL | Status: DC
Start: 2021-07-24 — End: 2021-07-24
  Filled 2021-07-24: qty 15

## 2021-07-24 MED ORDER — ROCURONIUM BROMIDE 10 MG/ML (PF) SYRINGE
PREFILLED_SYRINGE | INTRAVENOUS | Status: DC | PRN
  Administered 2021-07-24: 100 mg via INTRAVENOUS

## 2021-07-24 MED ORDER — FENTANYL CITRATE (PF) 250 MCG/5ML IJ SOLN
INTRAMUSCULAR | Status: DC | PRN
  Administered 2021-07-24: 100 ug via INTRAVENOUS

## 2021-07-24 MED ORDER — LACTATED RINGERS IV SOLN: INTRAVENOUS | Status: DC

## 2021-07-24 MED ORDER — APIXABAN 5 MG PO TABS: 5.0000 mg | ORAL_TABLET | Freq: Two times a day (BID) | ORAL | 0 refills | Status: DC

## 2021-07-24 MED ORDER — PHENYLEPHRINE HCL-NACL 20-0.9 MG/250ML-% IV SOLN
INTRAVENOUS | Status: DC | PRN
  Administered 2021-07-24: 20 ug/min via INTRAVENOUS

## 2021-07-24 MED ORDER — LIDOCAINE 2% (20 MG/ML) 5 ML SYRINGE
INTRAMUSCULAR | Status: DC | PRN
  Administered 2021-07-24: 100 mg via INTRAVENOUS

## 2021-07-24 MED ORDER — ONDANSETRON HCL 4 MG/2ML IJ SOLN
INTRAMUSCULAR | Status: DC | PRN
  Administered 2021-07-24: 4 mg via INTRAVENOUS

## 2021-07-24 MED ORDER — DEXAMETHASONE SODIUM PHOSPHATE 10 MG/ML IJ SOLN
INTRAMUSCULAR | Status: DC | PRN
  Administered 2021-07-24: 10 mg via INTRAVENOUS

## 2021-07-24 NOTE — Transfer of Care (Signed)
Immediate Anesthesia Transfer of Care Note ? ?Patient: Chris Lucas ? ?Procedure(s) Performed: ROBOTIC ASSISTED NAVIGATIONAL BRONCHOSCOPY ?VIDEO BRONCHOSCOPY WITH RADIAL ENDOBRONCHIAL ULTRASOUND ?BRONCHIAL NEEDLE ASPIRATION BIOPSIES ?BRONCHIAL BIOPSIES ?BRONCHIAL BRUSHINGS ? ?Patient Location: Endoscopy Unit ? ?Anesthesia Type:General ? ?Level of Consciousness: drowsy ? ?Airway & Oxygen Therapy: Patient Spontanous Breathing and Patient connected to face mask oxygen ? ?Post-op Assessment: Report given to RN, Post -op Vital signs reviewed and stable and Patient moving all extremities X 4 ? ?Post vital signs: Reviewed and stable ? ?Last Vitals:  ?Vitals Value Taken Time  ?BP 143/87 07/24/21 1433  ?Temp    ?Pulse 66 07/24/21 1433  ?Resp 19 07/24/21 1433  ?SpO2 92 % 07/24/21 1433  ?Vitals shown include unvalidated device data. ? ?Last Pain:  ?Vitals:  ? 07/24/21 1433  ?TempSrc: Temporal  ?PainSc:   ?   ? ?  ? ?Complications: No notable events documented. ?

## 2021-07-24 NOTE — Progress Notes (Signed)
Dr. Ermalene Postin made aware of patient's blood pressure. Will continue to monitor. ?

## 2021-07-24 NOTE — Interval H&P Note (Signed)
History and Physical Interval Note: ? ?07/24/2021 ?9:36 AM ? ?Chris Lucas  has presented today for surgery, with the diagnosis of LEFT UPPER LOPE MASS.  The various methods of treatment have been discussed with the patient and family. After consideration of risks, benefits and other options for treatment, the patient has consented to  Procedure(s): ?ROBOTIC ASSISTED NAVIGATIONAL BRONCHOSCOPY (N/A) as a surgical intervention.  The patient's history has been reviewed, patient examined, no change in status, stable for surgery.  I have reviewed the patient's chart and labs.  Questions were answered to the patient's satisfaction.   ? ? ?[name] ? ? ?

## 2021-07-24 NOTE — Discharge Instructions (Signed)
Flexible Bronchoscopy, Care After ?This sheet gives you information about how to care for yourself after your test. Your doctor may also give you more specific instructions. If you have problems or questions, contact your doctor. ?Follow these instructions at home: ?Eating and drinking ?Do not eat or drink anything (not even water) for 2 hours after your test, or until your numbing medicine (local anesthetic) wears off. ?When your numbness is gone and your cough and gag reflexes have come back, you may: ?Eat only soft foods. ?Slowly drink liquids. ?The day after the test, go back to your normal diet. ?Driving ?Do not drive for 24 hours if you were given a medicine to help you relax (sedative). ?Do not drive or use heavy machinery while taking prescription pain medicine. ?General instructions ? ?Take over-the-counter and prescription medicines only as told by your doctor. ?Return to your normal activities as told. Ask what activities are safe for you. ?Do not use any products that have nicotine or tobacco in them. This includes cigarettes and e-cigarettes. If you need help quitting, ask your doctor. ?Keep all follow-up visits as told by your doctor. This is important. It is very important if you had a tissue sample (biopsy) taken. ?Get help right away if: ?You have shortness of breath that gets worse. ?You get light-headed. ?You feel like you are going to pass out (faint). ?You have chest pain. ?You cough up: ?More than a little blood. ?More blood than before. ?Summary ?Do not eat or drink anything (not even water) for 2 hours after your test, or until your numbing medicine wears off. ?Do not use cigarettes. Do not use e-cigarettes. ?Get help right away if you have chest pain. ? ?Please call our office for any questions or concerns.  [phone]. ? ?This information is not intended to replace advice given to you by your health care provider. Make sure you discuss any questions you have with your health care  provider. ?Document Released: 01/21/2009 Document Revised: 03/08/2017 Document Reviewed: 04/13/2016 ?Elsevier Patient Education ? [place]. ? ?

## 2021-07-24 NOTE — Op Note (Signed)
Video Bronchoscopy with Robotic Assisted Bronchoscopic Navigation  ? ?Date of Operation: 07/24/2021  ? ?Pre-op Diagnosis: Lingular mass ? ?Post-op Diagnosis: Same ? ?Surgeon: Baltazar Apo ? ?Assistants: None ? ?Anesthesia: General endotracheal anesthesia ? ?Operation: Flexible video fiberoptic bronchoscopy with robotic assistance and biopsies. ? ?Estimated Blood Loss: Minimal ? ?Complications: None ? ?Indications and History: ?Chris Lucas is a 71 y.o. male with history of hypertension.  He was recently admitted and found to be in atrial flutter with a subacute CVA.  Part of his evaluation included chest imaging that identified a new macrolobulated solid lingular mass.  Recommendation made to achieve a tissue diagnosis via navigational bronchoscopy. The risks, benefits, complications, treatment options and expected outcomes were discussed with the patient.  The possibilities of pneumothorax, pneumonia, reaction to medication, pulmonary aspiration, perforation of a viscus, bleeding, failure to diagnose a condition and creating a complication requiring transfusion or operation were discussed with the patient who freely signed the consent.   ? ?Description of Procedure: ?The patient was seen in the Preoperative Area, was examined and was deemed appropriate to proceed.  The patient was taken to [facility] endoscopy room 3, identified as Chris Lucas and the procedure verified as Flexible Video Fiberoptic Bronchoscopy.  A Time Out was held and the above information confirmed.  ? ?Prior to the date of the procedure a high-resolution CT scan of the chest was performed. Utilizing ION software program a virtual tracheobronchial tree was generated to allow the creation of distinct navigation pathways to the patient's parenchymal abnormalities. After being taken to the operating room general anesthesia was initiated and the patient  was orally intubated. The video fiberoptic bronchoscope was introduced via the endotracheal  tube and a general inspection was performed which showed normal right and left lung anatomy. Aspiration of the bilateral mainstems was completed to remove any remaining secretions. Robotic catheter inserted into patient's endotracheal tube.  ? ?Target #1 lingular mass: ?The distinct navigation pathways prepared prior to this procedure were then utilized to navigate to patient's lesion identified on CT scan. The robotic catheter was secured into place and the vision probe was withdrawn.  Lesion location was approximated using fluoroscopy and radial endobronchial ultrasound for peripheral targeting. Under fluoroscopic guidance transbronchial needle brushings, transbronchial needle biopsies, and transbronchial forceps biopsies were performed to be sent for cytology and pathology.  The mass lesion was difficult to penetrate with biopsy instruments including 19-gauge and 21-gauge needles.  Finally a 23-gauge needle was more easily passed. ? ?At the end of the procedure a general airway inspection was performed and there was no evidence of active bleeding. The bronchoscope was removed.  The patient tolerated the procedure well. There was no significant blood loss and there were no obvious complications. A post-procedural chest x-ray is pending. ? ?Samples Target #1: ?1. Transbronchial needle brushings from lingular mass ?2. Transbronchial Wang needle biopsies from lingular mass ?3. Transbronchial forceps biopsies from lingular mass ? ?Plans:  ?The patient will be discharged from the PACU to home when recovered from anesthesia and after chest x-ray is reviewed. We will review the cytology, pathology and microbiology results with the patient when they become available. Outpatient followup will be with Dr. Lamonte Sakai.   ? ? ?Baltazar Apo, MD, PhD ?07/24/2021, 2:14 PM ?[facility] ?[phone] or if no answer before 7:00PM call [phone] ?For any issues after 7:00PM please call eLink [phone] ? ?

## 2021-07-24 NOTE — Anesthesia Procedure Notes (Signed)
Procedure Name: Intubation ?Date/Time: 07/24/2021 12:58 PM ?Performed by: Kyung Rudd, CRNA ?Pre-anesthesia Checklist: Patient identified, Emergency Drugs available, Suction available and Patient being monitored ?Patient Re-evaluated:Patient Re-evaluated prior to induction ?Oxygen Delivery Method: Circle System Utilized ?Preoxygenation: Pre-oxygenation with 100% oxygen ?Induction Type: IV induction ?Ventilation: Mask ventilation without difficulty ?Laryngoscope Size: Mac and 4 ?Grade View: Grade I ?Tube type: Oral ?Tube size: 8.5 mm ?Number of attempts: 1 ?Airway Equipment and Method: Stylet and Oral airway ?Placement Confirmation: ETT inserted through vocal cords under direct vision, positive ETCO2 and breath sounds checked- equal and bilateral ?Secured at: 23 cm ?Tube secured with: Tape ?Dental Injury: Teeth and Oropharynx as per pre-operative assessment  ?Comments: Intubated by Everlene Other SRNA ? ? ? ? ?

## 2021-07-25 ENCOUNTER — Encounter (HOSPITAL_COMMUNITY): Payer: Self-pay | Admitting: Emergency Medicine

## 2021-07-25 NOTE — Anesthesia Postprocedure Evaluation (Signed)
Anesthesia Post Note ? ?Patient: GILFORD LARDIZABAL ? ?Procedure(s) Performed: ROBOTIC ASSISTED NAVIGATIONAL BRONCHOSCOPY ?VIDEO BRONCHOSCOPY WITH RADIAL ENDOBRONCHIAL ULTRASOUND ?BRONCHIAL NEEDLE ASPIRATION BIOPSIES ?BRONCHIAL BIOPSIES ?BRONCHIAL BRUSHINGS ? ?  ? ?Patient location during evaluation: PACU ?Anesthesia Type: General ?Level of consciousness: awake and alert ?Pain management: pain level controlled ?Vital Signs Assessment: post-procedure vital signs reviewed and stable ?Respiratory status: spontaneous breathing, nonlabored ventilation, respiratory function stable and patient connected to nasal cannula oxygen ?Cardiovascular status: blood pressure returned to baseline and stable ?Postop Assessment: no apparent nausea or vomiting ?Anesthetic complications: no ? ? ?No notable events documented. ? ?Last Vitals:  ?Vitals:  ? 07/24/21 1534 07/24/21 1543  ?BP:  (!) 143/95  ?Pulse: 66 66  ?Resp: 20 (!) 23  ?Temp:    ?SpO2: 95% 93%  ?  ?Last Pain:  ?Vitals:  ? 07/24/21 1543  ?TempSrc:   ?PainSc: 0-No pain  ? ? ?  ?  ?  ?  ?  ?  ? ?Lynnix Schoneman L Che Rachal ? ? ? ? ?

## 2021-07-27 ENCOUNTER — Telehealth: Payer: Self-pay | Admitting: Emergency Medicine

## 2021-07-27 DIAGNOSIS — E782 Mixed hyperlipidemia: Secondary | ICD-10-CM | POA: Diagnosis not present

## 2021-07-27 DIAGNOSIS — E1165 Type 2 diabetes mellitus with hyperglycemia: Secondary | ICD-10-CM | POA: Diagnosis not present

## 2021-07-27 DIAGNOSIS — R972 Elevated prostate specific antigen [PSA]: Secondary | ICD-10-CM | POA: Diagnosis not present

## 2021-07-27 DIAGNOSIS — E559 Vitamin D deficiency, unspecified: Secondary | ICD-10-CM | POA: Diagnosis not present

## 2021-07-27 LAB — CYTOLOGY - NON PAP

## 2021-07-27 NOTE — Telephone Encounter (Signed)
Discussed bronchoscopy results with the patient by phone.  Showed a typical carcinoid tumor.  Good news.  We will plan to follow it.  Based on size, growth, any impact on symptoms we will determine when/if it needs to be resected.  He is going to be following with Dr. Kipp Brood regarding an aortic aneurysm, so we could discuss with him if we thought a resection was ever indicated.  Patient has follow-up with me on 08/24/2021. ?

## 2021-08-01 DIAGNOSIS — I10 Essential (primary) hypertension: Secondary | ICD-10-CM | POA: Diagnosis not present

## 2021-08-01 DIAGNOSIS — N1831 Chronic kidney disease, stage 3a: Secondary | ICD-10-CM | POA: Diagnosis not present

## 2021-08-01 DIAGNOSIS — E1165 Type 2 diabetes mellitus with hyperglycemia: Secondary | ICD-10-CM | POA: Diagnosis not present

## 2021-08-01 DIAGNOSIS — R809 Proteinuria, unspecified: Secondary | ICD-10-CM | POA: Diagnosis not present

## 2021-08-01 DIAGNOSIS — R972 Elevated prostate specific antigen [PSA]: Secondary | ICD-10-CM | POA: Diagnosis not present

## 2021-08-01 DIAGNOSIS — E782 Mixed hyperlipidemia: Secondary | ICD-10-CM | POA: Diagnosis not present

## 2021-08-01 DIAGNOSIS — I4892 Unspecified atrial flutter: Secondary | ICD-10-CM | POA: Diagnosis not present

## 2021-08-01 DIAGNOSIS — E559 Vitamin D deficiency, unspecified: Secondary | ICD-10-CM | POA: Diagnosis not present

## 2021-08-01 DIAGNOSIS — E669 Obesity, unspecified: Secondary | ICD-10-CM | POA: Diagnosis not present

## 2021-08-10 ENCOUNTER — Encounter: Payer: Self-pay | Admitting: Neurology

## 2021-08-10 ENCOUNTER — Ambulatory Visit: Payer: Medicare HMO | Admitting: Neurology

## 2021-08-10 VITALS — BP 145/85 | HR 73 | Ht 73.0 in | Wt 241.0 lb

## 2021-08-10 DIAGNOSIS — I639 Cerebral infarction, unspecified: Secondary | ICD-10-CM | POA: Diagnosis not present

## 2021-08-10 DIAGNOSIS — R918 Other nonspecific abnormal finding of lung field: Secondary | ICD-10-CM | POA: Diagnosis not present

## 2021-08-10 DIAGNOSIS — I7121 Aneurysm of the ascending aorta, without rupture: Secondary | ICD-10-CM

## 2021-08-10 NOTE — Progress Notes (Signed)
? ?[facility] ? ?PATIENT: Chris Lucas ?DOB: 10/01/1950 ? ?REQUESTING CLINICIAN: [name], MD ?HISTORY FROM: Patient and spouse  ?REASON FOR VISIT: Right cerebellar stroke  ? ? ?HISTORICAL ? ?CHIEF COMPLAINT:  ?Chief Complaint  ?Patient presents with  ? New Patient (Initial Visit)  ?  Room 14, with wife  ?NP/internal hopsital referral for acute ischemic stroke d/c home  ?  ?  ? ? ?HISTORY OF PRESENT ILLNESS:  ?This is a 71 year old gentleman past medical history of hypertension, hyperlipidemia, diabetes mellitus who was admitted in the hospital on March 30 after syncopal episode.  Patient reports he was having lunch with his wife then suddenly fell flushed and hot, sweating profusely, then started feeling cold and was told by wife that his eyes rolled back and he slumped over.  During this work-up, he was found to have a new onset atrial flutter and 4 mm right cerebellar infarct.  Stroke etiology was likely cardioembolic from the cardiac flutter and patient was started on Eliquis.  Since discharge from the hospital he reported being compliant with the medication and no side effects.  ? ?During the hospitalization he was also found to have a lung mass.  He reported following with his pulmonologist, had a biopsy and was told that it has benign.  He reported that pulm will continue to follow him.  In terms of his new diagnosis of aortic aneurysm, his pending follow-up with cardiothoracic surgery.  Currently he denies any deficit, denies any incoordination, stated that his gait is normal.  No falls. ? ? ? ? ?Brief/Interim Summary: Patient was initially admitted with the diagnosis and further work-up of syncope but he was also found to have new onset of atrial flutter at the time of admission.  Subsequently MRI brain showed acute ischemic stroke. Neurology was consulted, they recommended continuing anticoagulation but no antiplatelet since the cause of his stroke was embolic secondary to  new onset atrial flutter.  His rates were controlled.  He was also found to have new lung mass, 4.8 cm aortic aneurysm as well. Recommend semi-annual imagingfollowup by CTA or MRA and referral to cardiothoracic surgery.  Patient was seen by pulmonology for his lung mass.  They have scheduled him to see as outpatient with Dr. Malvin Johns on 07/10/2021 and they will plan outpatient bronchoscopy.  He was also seen by cardiology for new atrial flutter.  They recommended transitioning to Eliquis and no other medications and no other work-up.  His rates are controlled.  Patient is being prescribed Eliquis at the time of discharge.  He has been on Lovenox full dose during this hospitalization since yesterday.  I also discussed the case with Dr. Leonel Ramsay of neurology who reviewed patient's imagings including MRA of the head and neck and recommended no other medications other than Eliquis.  He will follow-up with neurology in 4 weeks. Patient is non-smoker.  I met with the patient and his wife in the room this morning and answered several questions.  Erich Montane spoke to him in the afternoon and reviewed his imaging studies with him and answered several other questions.  He is agreeable with the plan.  He verbalized understanding as well.  He was seen by PT OT and since he is at his baseline, they did not recommend any home health PT OT.  He has been cleared by all the consultant so he is going to be discharged in stable condition. ?  ?Discharge plan was discussed with patient and/or family member and  they verbalized understanding and agreed with it.  ? ? ?OTHER MEDICAL CONDITIONS: Atrial flutter, hypertension, hyperlipidemia, diabetes  ? ? ?REVIEW OF SYSTEMS: Full 14 system review of systems performed and negative with exception of: as noted in the HPI  ? ?ALLERGIES: ?Allergies  ?Allergen Reactions  ? Ivp Dye [Iodinated Contrast Media]   ? ? ?HOME MEDICATIONS: ?Outpatient Medications Prior to Visit  ?Medication Sig Dispense Refill  ?  amLODipine (NORVASC) 10 MG tablet Take 10 mg by mouth daily.    ? anti-nausea (EMETROL) solution Take 10 mLs by mouth every 15 (fifteen) minutes as needed for nausea or vomiting.    ? apixaban (ELIQUIS) 5 MG TABS tablet Take 1 tablet (5 mg total) by mouth 2 (two) times daily. Okay to restart this medication on 07/25/2021 60 tablet 0  ? bisacodyl (DULCOLAX) 5 MG EC tablet Take 5 mg by mouth daily as needed for moderate constipation.    ? FARXIGA 10 MG TABS tablet Take 10 mg by mouth daily.    ? furosemide (LASIX) 20 MG tablet Take 20 mg by mouth daily as needed for fluid.    ? olmesartan (BENICAR) 40 MG tablet Take 40 mg by mouth daily.    ? ondansetron (ZOFRAN-ODT) 4 MG disintegrating tablet Take 1 tablet (4 mg total) by mouth every 8 (eight) hours as needed for nausea or vomiting. 20 tablet 0  ? Vitamin D, Ergocalciferol, 50000 units CAPS Take by mouth.    ? atorvastatin (LIPITOR) 40 MG tablet Take 1 tablet (40 mg total) by mouth daily. 30 tablet 0  ? ?No facility-administered medications prior to visit.  ? ? ?PAST MEDICAL HISTORY: ?Past Medical History:  ?Diagnosis Date  ? Atrial flutter ([place]) 07/06/2021  ? HLD (hyperlipidemia)   ? Hypertension   ? Mass of upper lobe of left lung 07/2021  ? solid  ? Pre-diabetes   ? diet controlled  ? Stroke [facility]) 07/06/2021  ? Thoracic ascending aortic aneurysm ([place])   ? ? ?PAST SURGICAL HISTORY: ?Past Surgical History:  ?Procedure Laterality Date  ? BRONCHIAL BIOPSY  07/24/2021  ? Procedure: BRONCHIAL BIOPSIES;  Surgeon: Collene Gobble, MD;  Location: [facility] ENDOSCOPY;  Service: Pulmonary;;  ? BRONCHIAL BRUSHINGS  07/24/2021  ? Procedure: BRONCHIAL BRUSHINGS;  Surgeon: Collene Gobble, MD;  Location: [facility] ENDOSCOPY;  Service: Pulmonary;;  ? BRONCHIAL NEEDLE ASPIRATION BIOPSY  07/24/2021  ? Procedure: BRONCHIAL NEEDLE ASPIRATION BIOPSIES;  Surgeon: Collene Gobble, MD;  Location: [facility] ENDOSCOPY;  Service: Pulmonary;;  ? CARDIAC CATHETERIZATION    ? "Years ago"  (apporx 18-20 yrs)  ?  colonoscopy    ? VIDEO BRONCHOSCOPY WITH RADIAL ENDOBRONCHIAL ULTRASOUND  07/24/2021  ? Procedure: VIDEO BRONCHOSCOPY WITH RADIAL ENDOBRONCHIAL ULTRASOUND;  Surgeon: Collene Gobble, MD;  Location: [facility] ENDOSCOPY;  Service: Pulmonary;;  ? ? ?FAMILY HISTORY: ?Family History  ?Problem Relation Age of Onset  ? Diabetes Mother   ? ? ?SOCIAL HISTORY: ?Social History  ? ?Socioeconomic History  ? Marital status: Married  ?  Spouse name: Not on file  ? Number of children: Not on file  ? Years of education: Not on file  ? Highest education level: Not on file  ?Occupational History  ? Not on file  ?Tobacco Use  ? Smoking status: Never  ? Smokeless tobacco: Never  ?Vaping Use  ? Vaping Use: Never used  ?Substance and Sexual Activity  ? Alcohol use: Not Currently  ? Drug use: Not Currently  ? Sexual activity: Not Currently  ?  Other Topics Concern  ? Not on file  ?Social History Narrative  ? Not on file  ? ?Social Determinants of Health  ? ?Financial Resource Strain: Not on file  ?Food Insecurity: Not on file  ?Transportation Needs: Not on file  ?Physical Activity: Not on file  ?Stress: Not on file  ?Social Connections: Not on file  ?Intimate Partner Violence: Not on file  ? ? ?PHYSICAL EXAM ? ?GENERAL EXAM/CONSTITUTIONAL: ?Vitals:  ?Vitals:  ? 08/10/21 0825  ?BP: (!) 145/85  ?Pulse: 73  ?Weight: 241 lb (109.3 kg)  ?Height: $RemoveB'6\' 1"'fyOTNQWn$  (1.854 m)  ? ?Body mass index is 31.8 kg/m?. ?Wt Readings from Last 3 Encounters:  ?08/10/21 241 lb (109.3 kg)  ?07/21/21 240 lb 4.8 oz (109 kg)  ?07/12/21 241 lb (109.3 kg)  ? ?Patient is in no distress; well developed, nourished and groomed; neck is supple ? ?EYES: ?Pupils round and reactive to light, Visual fields full to confrontation, Extraocular movements intacts,  ? ?MUSCULOSKELETAL: ?Gait, strength, tone, movements noted in Neurologic exam below ? ?NEUROLOGIC: ?MENTAL STATUS:  ?   ? View : No data to display.  ?  ?  ?  ? ?awake, alert, oriented to person, place and time ?recent and remote memory  intact ?normal attention and concentration ?language fluent, comprehension intact, naming intact ?fund of knowledge appropriate ? ?CRANIAL NERVE: ?2nd, 3rd, 4th, 6th - pupils equal and reactive to light, visual f

## 2021-08-10 NOTE — Patient Instructions (Addendum)
Continue current medications  ?Blood pressure management, goal less than 922 systolic  ?Follow up with your cardiologist/pulmonologist as scheduled  ?Return in a year  ? ?

## 2021-08-17 NOTE — Progress Notes (Signed)
? ?   ?[address] ?      [place] [address] ?            [phone]   ?                 ?MORT SMELSER ?[facility] Record #[id number] ?Date of Birth: April 28, 1950 ? ?Referring: [name], MD ?Primary Care: [name], MD ?Primary Cardiologist: None ? ?Chief Complaint:    ?Chief Complaint  ?Patient presents with  ? Thoracic Aortic Aneurysm  ?  Initial surgical consult, CT chest 3/30  ? ? ?History of Present Illness:    ?Chris Lucas 71 y.o. male presents for surgical evaluation of a left upper lobe carcinoid tumor, ascending aortic aneurysm, and chronically elevated left hemidiaphragm.  He has previously been seen by Dr. 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  He denies any respiratory symptoms.  He is a lifelong non-smoker, and remains very active with weightlifting and exercise. ? ?This nodule was originally found incidentally when he presented with a stroke to come.  He was then noted to have a 3.5 cm left upper lobe macrolobulated mass.  During this hospitalization he was also noted to be in atrial fibrillation and was placed on Eliquis. ? ?He denies any shortness of breath, or coughs.  He denies any chest pain.  He has had some lower extremity swelling recently and was placed on Lasix. ? ? ? ? ? ?Zubrod Score: ?At the time of surgery this patient?s most appropriate activity status/level should be described as: ?[x]     0    Normal activity, no symptoms ?[]     1    Restricted in physical strenuous activity but ambulatory, able to do out light work ?[]     2    Ambulatory and capable of self care, unable to do work activities, up and about               >50 % of waking hours                              ?[]     3    Only limited self care, in bed greater than 50% of waking hours ?[]     4    Completely disabled, no self care, confined to bed or chair ?[]     5    Moribund ? ? ?Past  Medical History:  ?Diagnosis Date  ? Atrial flutter ([place]) 07/06/2021  ? HLD (hyperlipidemia)   ? Hypertension   ? Mass of upper lobe of left lung 07/2021  ? solid  ? Pre-diabetes   ? diet controlled  ? Stroke [facility]) 07/06/2021  ? Thoracic ascending aortic aneurysm ([place])   ? ? ?Past Surgical History:  ?Procedure Laterality Date  ? BRONCHIAL BIOPSY  07/24/2021  ? Procedure: BRONCHIAL BIOPSIES;  Surgeon: Collene Gobble, MD;  Location: [facility] ENDOSCOPY;  Service: Pulmonary;;  ? BRONCHIAL BRUSHINGS  07/24/2021  ? Procedure: BRONCHIAL BRUSHINGS;  Surgeon: Collene Gobble, MD;  Location: [facility] ENDOSCOPY;  Service: Pulmonary;;  ? BRONCHIAL NEEDLE ASPIRATION BIOPSY  07/24/2021  ? Procedure: BRONCHIAL NEEDLE ASPIRATION BIOPSIES;  Surgeon: Collene Gobble, MD;  Location: [facility] ENDOSCOPY;  Service: Pulmonary;;  ? CARDIAC CATHETERIZATION    ? "Years ago"  (apporx 18-20 yrs)  ? colonoscopy    ? VIDEO BRONCHOSCOPY WITH RADIAL ENDOBRONCHIAL  ULTRASOUND  07/24/2021  ? Procedure: VIDEO BRONCHOSCOPY WITH RADIAL ENDOBRONCHIAL ULTRASOUND;  Surgeon: Collene Gobble, MD;  Location: [facility] ENDOSCOPY;  Service: Pulmonary;;  ? ? ?Family History  ?Problem Relation Age of Onset  ? Diabetes Mother   ? ? ? ?Social History  ? ?Tobacco Use  ?Smoking Status Never  ?Smokeless Tobacco Never  ?  ?Social History  ? ?Substance and Sexual Activity  ?Alcohol Use Not Currently  ? ? ? ?Allergies  ?Allergen Reactions  ? Ivp Dye [Iodinated Contrast Media]   ? ? ?Current Outpatient Medications  ?Medication Sig Dispense Refill  ? amLODipine (NORVASC) 10 MG tablet Take 10 mg by mouth daily.    ? anti-nausea (EMETROL) solution Take 10 mLs by mouth every 15 (fifteen) minutes as needed for nausea or vomiting.    ? apixaban (ELIQUIS) 5 MG TABS tablet Take 1 tablet (5 mg total) by mouth 2 (two) times daily. Okay to restart this medication on 07/25/2021 60 tablet 0  ? bisacodyl (DULCOLAX) 5 MG EC tablet Take 5 mg by mouth daily as needed for moderate constipation.    ? FARXIGA 10 MG TABS  tablet Take 10 mg by mouth daily.    ? furosemide (LASIX) 20 MG tablet Take 20 mg by mouth daily as needed for fluid.    ? olmesartan (BENICAR) 40 MG tablet Take 40 mg by mouth daily.    ? ondansetron (ZOFRAN-ODT) 4 MG disintegrating tablet Take 1 tablet (4 mg total) by mouth every 8 (eight) hours as needed for nausea or vomiting. 20 tablet 0  ? Vitamin D, Ergocalciferol, 50000 units CAPS Take by mouth.    ? atorvastatin (LIPITOR) 40 MG tablet Take 1 tablet (40 mg total) by mouth daily. 30 tablet 0  ? ?No current facility-administered medications for this visit.  ? ? ?Review of Systems  ?Constitutional:  Negative for fever, malaise/fatigue and weight loss.  ?Respiratory:  Negative for cough and shortness of breath.   ?Cardiovascular:  Positive for palpitations and leg swelling. Negative for chest pain.  ?Neurological: Negative.   ? ? ?PHYSICAL EXAMINATION: ?BP (!) 158/79 (BP Location: Left Arm, Patient Position: Sitting)   Pulse 66   Resp 20   Ht 6\' 1"  (1.854 m)   Wt 248 lb (112.5 kg)   SpO2 97% Comment: RA  BMI 32.72 kg/m?  ?Physical Exam ?Constitutional:   ?   General: He is not in acute distress. ?   Appearance: Normal appearance. He is normal weight. He is not ill-appearing.  ?HENT:  ?   Head: Normocephalic and atraumatic.  ?Cardiovascular:  ?   Rate and Rhythm: Normal rate. Rhythm irregular.  ?   Pulses: Normal pulses.  ?Pulmonary:  ?   Effort: Pulmonary effort is normal. No respiratory distress.  ?Musculoskeletal:     ?   General: Normal range of motion.  ?   Cervical back: Normal range of motion.  ?Skin: ?   General: Skin is warm and dry.  ?Neurological:  ?   General: No focal deficit present.  ?   Mental Status: He is alert and oriented to person, place, and time.  ? ? ?Diagnostic Studies & Laboratory data: ?   ? Recent Radiology Findings:  ? DG Chest Port 1 View ? ?Result Date: 07/24/2021 ?CLINICAL DATA:  Status post bronchoscopy with biopsy, left upper lobe mass EXAM: PORTABLE CHEST 1 VIEW COMPARISON:   07/06/2021 FINDINGS: Single frontal view of the chest demonstrates an unremarkable cardiac silhouette. Lung volumes are diminished,  with chronic elevation of the left hemidiaphragm. The left upper lobe mass abutting the major fissure on prior study is again identified. Hypoventilatory changes are seen at the lung bases, with lower lobe atelectasis. No new airspace disease, effusion, or pneumothorax identified. IMPRESSION: 1. Low lung volumes with bilateral lower lobe atelectasis. 2. No complication after bronchoscopy and biopsy. No evidence of pneumothorax. 3. 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 Electronically Signed   By: Randa Ngo M.D.   On: 07/24/2021 15:35  ? ?DG C-ARM BRONCHOSCOPY ? ?Result Date: 07/24/2021 ?C-ARM BRONCHOSCOPY: Fluoroscopy was utilized by the requesting physician.  No radiographic interpretation.   ? ? ?  ?I have independently reviewed the above radiology studies  and reviewed the findings with the patient.  ? ?Recent Lab Findings: ?Lab Results  ?Component Value Date  ? WBC 5.9 07/07/2021  ? HGB 14.8 07/07/2021  ? HCT 45.0 07/07/2021  ? PLT 209 07/07/2021  ? GLUCOSE 123 (H) 07/07/2021  ? CHOL 157 07/07/2021  ? TRIG 109 07/07/2021  ? HDL 41 07/07/2021  ? [place] 94 07/07/2021  ? ALT 20 05/27/2008  ? AST 18 05/27/2008  ? NA 138 07/07/2021  ? K 3.9 07/07/2021  ? CL 103 07/07/2021  ? CREATININE 1.41 (H) 07/07/2021  ? BUN 24 (H) 07/07/2021  ? CO2 27 07/07/2021  ? TSH 1.119 07/06/2021  ? HGBA1C 7.4 (H) 07/07/2021  ? ? ? ?PFTs: ?Pending ? 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 ?Elevated left hemidiaphragm. ? ? ? ?Assessment / Plan:   ?71 year old male with history of cerebrovascular event, was found to have a carcinoid tumor in his left upper lobe as well as a 4.8 cm ascending aortic aneurysm.  We reviewed the cross-sectional imaging the mass is quite central thus could have a lobectomy.  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  There is no indication for surgical repair at this point.   ? ?In regards to the carcinoid tumor he will need pulmonary function testing.  We will as

## 2021-08-18 ENCOUNTER — Institutional Professional Consult (permissible substitution): Payer: Medicare HMO | Admitting: Thoracic Surgery (Cardiothoracic Vascular Surgery)

## 2021-08-18 ENCOUNTER — Other Ambulatory Visit: Payer: Self-pay | Admitting: Thoracic Surgery (Cardiothoracic Vascular Surgery)

## 2021-08-18 VITALS — BP 158/79 | HR 66 | Resp 20 | Ht 73.0 in | Wt 248.0 lb

## 2021-08-18 DIAGNOSIS — I712 Thoracic aortic aneurysm, without rupture, unspecified: Secondary | ICD-10-CM

## 2021-08-18 DIAGNOSIS — I7121 Aneurysm of the ascending aorta, without rupture: Secondary | ICD-10-CM | POA: Diagnosis not present

## 2021-08-21 ENCOUNTER — Other Ambulatory Visit: Payer: Medicare HMO

## 2021-08-21 ENCOUNTER — Other Ambulatory Visit: Payer: Self-pay | Admitting: Thoracic Surgery (Cardiothoracic Vascular Surgery)

## 2021-08-21 DIAGNOSIS — R918 Other nonspecific abnormal finding of lung field: Secondary | ICD-10-CM | POA: Diagnosis not present

## 2021-08-21 DIAGNOSIS — R972 Elevated prostate specific antigen [PSA]: Secondary | ICD-10-CM | POA: Diagnosis not present

## 2021-08-21 DIAGNOSIS — I7121 Aneurysm of the ascending aorta, without rupture: Secondary | ICD-10-CM

## 2021-08-21 DIAGNOSIS — I4892 Unspecified atrial flutter: Secondary | ICD-10-CM | POA: Diagnosis not present

## 2021-08-21 DIAGNOSIS — I10 Essential (primary) hypertension: Secondary | ICD-10-CM | POA: Diagnosis not present

## 2021-08-21 DIAGNOSIS — E559 Vitamin D deficiency, unspecified: Secondary | ICD-10-CM | POA: Diagnosis not present

## 2021-08-21 DIAGNOSIS — I712 Thoracic aortic aneurysm, without rupture, unspecified: Secondary | ICD-10-CM | POA: Diagnosis not present

## 2021-08-23 ENCOUNTER — Ambulatory Visit
Admission: RE | Admit: 2021-08-23 | Discharge: 2021-08-23 | Disposition: A | Discharge status: Home / Self Care | Payer: Medicare HMO | Source: Ambulatory Visit | Attending: Thoracic Surgery (Cardiothoracic Vascular Surgery) | Admitting: Thoracic Surgery (Cardiothoracic Vascular Surgery)

## 2021-08-23 ENCOUNTER — Encounter: Payer: Self-pay | Admitting: Internal Medicine

## 2021-08-23 ENCOUNTER — Ambulatory Visit: Payer: Medicare HMO | Admitting: Internal Medicine

## 2021-08-23 ENCOUNTER — Other Ambulatory Visit: Payer: Self-pay | Admitting: Thoracic Surgery (Cardiothoracic Vascular Surgery)

## 2021-08-23 VITALS — BP 142/82 | HR 74 | Ht 73.0 in | Wt 244.2 lb

## 2021-08-23 DIAGNOSIS — R911 Solitary pulmonary nodule: Secondary | ICD-10-CM | POA: Diagnosis not present

## 2021-08-23 DIAGNOSIS — Z01812 Encounter for preprocedural laboratory examination: Secondary | ICD-10-CM

## 2021-08-23 DIAGNOSIS — I483 Typical atrial flutter: Secondary | ICD-10-CM

## 2021-08-23 DIAGNOSIS — I7121 Aneurysm of the ascending aorta, without rupture: Secondary | ICD-10-CM

## 2021-08-23 MED ORDER — CARVEDILOL 25 MG PO TABS: 25.0000 mg | ORAL_TABLET | Freq: Two times a day (BID) | ORAL | 3 refills | Status: DC

## 2021-08-23 NOTE — Patient Instructions (Signed)
Medication Instructions:  ?START carvedilol (Coreg) 25 mg two times daily ? ?*If you need a refill on your cardiac medications before your next appointment, please call your pharmacy* ? ? ?Lab Work: ?BMET, CBC today ? ?If you have labs (blood work) drawn today and your tests are completely normal, you will receive your results only by: ?MyChart Message (if you have MyChart) OR ?A paper copy in the mail ?If you have any lab test that is abnormal or we need to change your treatment, we will call you to review the results. ? ? ?Testing/Procedures: ?Your physician has recommended that you have a Cardioversion (DCCV). Electrical Cardioversion uses a jolt of electricity to your heart either through paddles or wired patches attached to your chest. This is a controlled, usually prescheduled, procedure. Defibrillation is done under light anesthesia in the hospital, and you usually go home the day of the procedure. This is done to get your heart back into a normal rhythm. You are not awake for the procedure. Please see the instruction sheet given to you today. ? ?Your physician has recommended that you have a sleep study. This test records several body functions during sleep, including: brain activity, eye movement, oxygen and carbon dioxide blood levels, heart rate and rhythm, breathing rate and rhythm, the flow of air through your mouth and nose, snoring, body muscle movements, and chest and belly movement. ? ?Follow-Up: ?At [facility], you and your health needs are our priority.  As part of our continuing mission to provide you with exceptional heart care, we have created designated Provider Care Teams.  These Care Teams include your primary Cardiologist (physician) and Advanced Practice Providers (APPs -  Physician Assistants and Nurse Practitioners) who all work together to provide you with the care you need, when you need it. ? ?We recommend signing up for the patient portal called "MyChart".  Sign up information is  provided on this After Visit Summary.  MyChart is used to connect with patients for Virtual Visits (Telemedicine).  Patients are able to view lab/test results, encounter notes, upcoming appointments, etc.  Non-urgent messages can be sent to your provider as well.   ?To learn more about what you can do with MyChart, go to [email or web address].   ? ?Your next appointment:   ?3 month(s) ? ?The format for your next appointment:   ?In Person ? ?Provider:   ?Dr. Harl Bowie  ? ?Other Instructions ? ? ? ?You are scheduled for a Cardioversion on Friday 5/26 with Dr. Oval Linsey.  Please arrive at the [facility] (Main Entrance A) at [facility]: [address] [place], [place] [address] at 8 AM. ? ?DIET: Nothing to eat or drink after midnight except a sip of water with medications (see medication instructions below) ? ?FYI: For your safety, and to allow [place] to monitor your vital signs accurately during the surgery/procedure we request that   ?if you have artificial nails, gel coating, SNS etc. Please have those removed prior to your surgery/procedure. Not having the nail coverings /polish removed may result in cancellation or delay of your surgery/procedure. ? ? ?Medication Instructions: ?Hold Farxiga, Lasix AM of procedure ? ?Continue your anticoagulant: Eliquis ?You will need to continue your anticoagulant after your procedure until you  are told by your  ?Provider that it is safe to stop ? ? ?Labs: Today in office (BMET, CBC) ? ?You must have a responsible person to drive you home and stay in the waiting area during your procedure. Failure to do  so could result in cancellation. ? ?Interior and spatial designer cards. ? ?*Special Note: Every effort is made to have your procedure done on time. Occasionally there are emergencies that occur at the hospital that may cause delays. Please be patient if a delay does occur.  ? ? ? ? ?

## 2021-08-23 NOTE — H&P (View-Only) (Signed)
Cardiology Office Note:    Date:  08/23/2021   ID:  Chris Lucas, DOB 1950/12/27, MRN [id number]  PCP:  [name], MD   [facility] Providers Cardiologist:  None     Referring MD: [name], MD   No chief complaint on file. Establish care  History of Present Illness:    Chris Lucas is a 71 y.o. 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 He had transient LOC for 30 seconds. He felt nauseas at this time after eating. EKG was notable for typical atrial flutter with variable block. LOC cause was unknown; unlikely cardiac.  For his atrial flutter he was started on eliquis.  At this time he had an incidental finding of a lung mass c/f malignancy. He had biopsy + for carcinoid tumor. He was also noted to have 4.8 cm ascending thoracic aortic aneurysm.  Today, he presents for follow up. He's had no recurrence of his symptoms of LOC. No chest pressure or SOB. He lifts weights.TSH is normal. He has apneic events per his wife while sleeping  Otherwise he notes he had a cath 20 years ago. He notes allergy to contrast dye.   He was seen by Dr. Kipp Brood for resection of his carcinoid tumor recently. Recommended PFTs prior to procedure.  Does not have an oncologist.    Cardiology Studies 07/07/2021:TTE- EF normal.  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   Pre-diabetes    diet controlled   Stroke ([place]) 07/06/2021   Thoracic ascending aortic aneurysm [facility])     Past Surgical History:  Procedure Laterality Date   BRONCHIAL BIOPSY  07/24/2021   Procedure: BRONCHIAL BIOPSIES;  Surgeon: Collene Gobble, MD;  Location: [facility] ENDOSCOPY;   Service: Pulmonary;;   BRONCHIAL BRUSHINGS  07/24/2021   Procedure: BRONCHIAL BRUSHINGS;  Surgeon: Collene Gobble, MD;  Location: [facility] ENDOSCOPY;  Service: Pulmonary;;   BRONCHIAL NEEDLE ASPIRATION BIOPSY  07/24/2021   Procedure: BRONCHIAL NEEDLE ASPIRATION BIOPSIES;  Surgeon: Collene Gobble, MD;  Location: [facility] ENDOSCOPY;  Service: Pulmonary;;   CARDIAC CATHETERIZATION     "Years ago"  (apporx 18-20 yrs)   colonoscopy     VIDEO BRONCHOSCOPY WITH RADIAL ENDOBRONCHIAL ULTRASOUND  07/24/2021   Procedure: VIDEO BRONCHOSCOPY WITH RADIAL ENDOBRONCHIAL ULTRASOUND;  Surgeon: Collene Gobble, MD;  Location: [facility] ENDOSCOPY;  Service: Pulmonary;;    Current Medications: Current Outpatient Medications on File Prior to Visit  Medication Sig Dispense Refill   amLODipine (NORVASC) 10 MG tablet Take 10 mg by mouth daily.     anti-nausea (EMETROL) solution Take 10 mLs by mouth every 15 (fifteen) minutes as needed for nausea or vomiting.     apixaban (ELIQUIS) 5 MG TABS tablet Take 1 tablet (5 mg total) by mouth 2 (two) times daily. Okay to restart this medication on 07/25/2021 60 tablet 0   atorvastatin (LIPITOR) 40 MG tablet Take 1 tablet (40 mg total) by mouth daily. 30 tablet 0   bisacodyl (DULCOLAX) 5 MG EC tablet Take 5 mg by mouth daily as needed for moderate constipation.     FARXIGA 10 MG TABS  tablet Take 10 mg by mouth daily.     furosemide (LASIX) 20 MG tablet Take 20 mg by mouth daily as needed for fluid.     LORATADINE ALLERGY RELIEF PO Take by mouth.     olmesartan (BENICAR) 40 MG tablet Take 40 mg by mouth daily.     ondansetron (ZOFRAN-ODT) 4 MG disintegrating tablet Take 1 tablet (4 mg total) by mouth every 8 (eight) hours as needed for nausea or vomiting. 20 tablet 0   Vitamin D, Ergocalciferol, 50000 units CAPS Take by mouth.     No current facility-administered medications on file prior to visit.     Allergies:   Ivp dye [iodinated contrast media]   Social History   Socioeconomic  History   Marital status: Married    Spouse name: Not on file   Number of children: Not on file   Years of education: Not on file   Highest education level: Not on file  Occupational History   Not on file  Tobacco Use   Smoking status: Never    Passive exposure: Never   Smokeless tobacco: Never  Vaping Use   Vaping Use: Never used  Substance and Sexual Activity   Alcohol use: Not Currently   Drug use: Not Currently   Sexual activity: Not Currently  Other Topics Concern   Not on file  Social History Narrative   Not on file   Social Determinants of Health   Financial Resource Strain: Not on file  Food Insecurity: Not on file  Transportation Needs: Not on file  Physical Activity: Not on file  Stress: Not on file  Social Connections: Not on file     Family History: The patient's family history includes Aneurysm in his sister; Diabetes in his mother and sister; Pancreatic cancer in his sister; Stroke in his father.  ROS:   Please see the history of present illness.     All other systems reviewed and are negative.  EKGs/Labs/Other Studies Reviewed:    The following studies were reviewed today:   EKG:  EKG is  ordered today.  The ekg ordered today demonstrates   Atrial flutter with variable AV block 74 bpm  Recent Labs: 07/06/2021: Magnesium 2.4; TSH 1.119 07/07/2021: BUN 24; Creatinine, Ser 1.41; Hemoglobin 14.8; Platelets 209; Potassium 3.9; Sodium 138  Recent Lipid Panel    Component Value Date/Time   CHOL 157 07/07/2021 0424   TRIG 109 07/07/2021 0424   HDL 41 07/07/2021 0424   CHOLHDL 3.8 07/07/2021 0424   VLDL 22 07/07/2021 0424   LDLCALC 94 07/07/2021 0424     Risk Assessment/Calculations:           Physical Exam:    VS:    Vitals:   08/23/21 1112 08/23/21 1115  BP: 140/78 (!) 142/82  Pulse: 74   SpO2: 98%      Wt Readings from Last 3 Encounters:  08/23/21 244 lb 3.2 oz (110.8 kg)  08/18/21 248 lb (112.5 kg)  08/10/21 241 lb (109.3 kg)      GEN:  Well nourished, well developed in no acute distress HEENT: Normal NECK: No JVD; No carotid bruits LYMPHATICS: No lymphadenopathy CARDIAC: RRR, no murmurs, rubs, gallops RESPIRATORY:  Clear to auscultation without rales, wheezing or rhonchi  ABDOMEN: Soft, non-tender, non-distended MUSCULOSKELETAL:  No edema; No deformity  SKIN: Warm and dry NEUROLOGIC:  Alert and oriented x 3 PSYCHIATRIC:  Normal affect   ASSESSMENT:    Typical Atrial Flutter: variable AV block. new  onset March 2023. If has recurrence, will consider Ep referral for CTI ablation. For now will aim to establish sinus rhythm with DCCV. He has taken his eliquis consistently. -continue eliquis will need to be on eliquis uninterrupted for 4 weeks post DCCV prior to holding for a procedure. -DCCV -if recurrence EP referral for ablation  HTN: goal BP closer 120/80 mmHg with aneurysm. continue norvasc 10 mg daily. Continue olmesartan 40 mg daily. Start carvedilol 25 mg BID  HLD: atorvastatin 40 mg daily  Aortic ascending aneurysm 4.8 cm 06/2021, can repeat in 6 months. Will repeat with CT  CardioOnc: with carcinoid tumor, will assess for symptoms of right sided valve dx with low threshold for repeat imaging  PLAN:    In order of problems listed above:  Start coreg 25 BID Recommend a sleep study with pulmonology DCCV Follow up in 3 months      Medication Adjustments/Labs and Tests Ordered: Current medicines are reviewed at length with the patient today.  Concerns regarding medicines are outlined above.  Orders Placed This Encounter  Procedures   Basic metabolic panel   CBC   EKG 12-Lead   Split night study   Meds ordered this encounter  Medications   carvedilol (COREG) 25 MG tablet    Sig: Take 1 tablet (25 mg total) by mouth 2 (two) times daily.    Dispense:  180 tablet    Refill:  3    Patient Instructions  Medication Instructions:  START carvedilol (Coreg) 25 mg two times daily  *If you  need a refill on your cardiac medications before your next appointment, please call your pharmacy*   Lab Work: BMET, CBC today  If you have labs (blood work) drawn today and your tests are completely normal, you will receive your results only by: [place] (if you have MyChart) OR A paper copy in the mail If you have any lab test that is abnormal or we need to change your treatment, we will call you to review the results.   Testing/Procedures: Your physician has recommended that you have a Cardioversion (DCCV). Electrical Cardioversion uses a jolt of electricity to your heart either through paddles or wired patches attached to your chest. This is a controlled, usually prescheduled, procedure. Defibrillation is done under light anesthesia in the hospital, and you usually go home the day of the procedure. This is done to get your heart back into a normal rhythm. You are not awake for the procedure. Please see the instruction sheet given to you today.  Your physician has recommended that you have a sleep study. This test records several body functions during sleep, including: brain activity, eye movement, oxygen and carbon dioxide blood levels, heart rate and rhythm, breathing rate and rhythm, the flow of air through your mouth and nose, snoring, body muscle movements, and chest and belly movement.  Follow-Up: At [facility], you and your health needs are our priority.  As part of our continuing mission to provide you with exceptional heart care, we have created designated Provider Care Teams.  These Care Teams include your primary Cardiologist (physician) and Advanced Practice Providers (APPs -  Physician Assistants and Nurse Practitioners) who all work together to provide you with the care you need, when you need it.  We recommend signing up for the patient portal called "MyChart".  Sign up information is provided on this After Visit Summary.  MyChart is used to connect with patients for  Virtual Visits (Telemedicine).  Patients are able  to view lab/test results, encounter notes, upcoming appointments, etc.  Non-urgent messages can be sent to your provider as well.   To learn more about what you can do with MyChart, go to [email or web address].    Your next appointment:   3 month(s)  The format for your next appointment:   In Person  Provider:   Dr. Harl Bowie   Other Instructions    You are scheduled for a Cardioversion on Friday 5/26 with Dr. Oval Linsey.  Please arrive at the [facility] (Main Entrance A) at [facility]: [place], [place] [address] at 8 AM.  DIET: Nothing to eat or drink after midnight except a sip of water with medications (see medication instructions below)  FYI: For your safety, and to allow [place] to monitor your vital signs accurately during the surgery/procedure we request that   if you have artificial nails, gel coating, SNS etc. Please have those removed prior to your surgery/procedure. Not having the nail coverings /polish removed may result in cancellation or delay of your surgery/procedure.   Medication Instructions: Hold Farxiga, Lasix AM of procedure  Continue your anticoagulant: Eliquis You will need to continue your anticoagulant after your procedure until you  are told by your  Provider that it is safe to stop   Labs: Today in office (BMET, CBC)  You must have a responsible person to drive you home and stay in the waiting area during your procedure. Failure to do so could result in cancellation.  Bring your insurance cards.  *Special Note: Every effort is made to have your procedure done on time. Occasionally there are emergencies that occur at the hospital that may cause delays. Please be patient if a delay does occur.        Signed, Janina Mayo, MD  08/23/2021 12:51 PM    [facility]

## 2021-08-23 NOTE — Progress Notes (Addendum)
?Cardiology Office Note:   ? ?Date:  08/23/2021  ? ?ID:  Chris Lucas, DOB March 27, 1951, MRN [id number] ? ?PCP:  [name], MD ?  ?[facility] Providers ?Cardiologist:  None    ? ?Referring MD: [name], MD  ? ?No chief complaint on file. ?Establish care ? ?History of Present Illness:   ? ?Chris Lucas is a 71 y.o. 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 ?Patient was seen in the hospital 06/2021 in consult. He had transient LOC for 30 seconds. He felt nauseas at this time after eating. EKG was notable for typical atrial flutter with variable block. LOC cause was unknown; unlikely cardiac.  For his atrial flutter he was started on eliquis.  At this time he had an incidental finding of a lung mass c/f malignancy. He had biopsy + for carcinoid tumor. He was also noted to have 4.8 cm ascending thoracic aortic aneurysm. ? ?Today, he presents for follow up. He's had no recurrence of his symptoms of LOC. No chest pressure or SOB. He lifts weights.TSH is normal. He has apneic events per his wife while sleeping ? ?Otherwise he notes he had a cath 20 years ago. He notes allergy to contrast dye.  ? ?He was seen by Dr. Kipp Brood for resection of his carcinoid tumor recently. Recommended PFTs prior to procedure.  Does not have an oncologist.  ? ? ?Cardiology Studies ?07/07/2021:TTE- EF normal.  LA vol index 42 cc/m2, no valve disease ? ?Past Medical History:  ?Diagnosis Date  ? Atrial flutter ([place]) 07/06/2021  ? HLD (hyperlipidemia)   ? Hypertension   ? Mass of upper lobe of left lung 07/2021  ? solid  ? Pre-diabetes   ? diet controlled  ? Stroke [facility]) 07/06/2021  ? Thoracic ascending aortic aneurysm ([place])   ? ? ?Past Surgical History:  ?Procedure Laterality Date  ? BRONCHIAL BIOPSY  07/24/2021  ? Procedure: BRONCHIAL BIOPSIES;  Surgeon: Collene Gobble, MD;  Location: [facility] ENDOSCOPY;   Service: Pulmonary;;  ? BRONCHIAL BRUSHINGS  07/24/2021  ? Procedure: BRONCHIAL BRUSHINGS;  Surgeon: Collene Gobble, MD;  Location: [facility] ENDOSCOPY;  Service: Pulmonary;;  ? BRONCHIAL NEEDLE ASPIRATION BIOPSY  07/24/2021  ? Procedure: BRONCHIAL NEEDLE ASPIRATION BIOPSIES;  Surgeon: Collene Gobble, MD;  Location: [facility] ENDOSCOPY;  Service: Pulmonary;;  ? CARDIAC CATHETERIZATION    ? "Years ago"  (apporx 18-20 yrs)  ? colonoscopy    ? VIDEO BRONCHOSCOPY WITH RADIAL ENDOBRONCHIAL ULTRASOUND  07/24/2021  ? Procedure: VIDEO BRONCHOSCOPY WITH RADIAL ENDOBRONCHIAL ULTRASOUND;  Surgeon: Collene Gobble, MD;  Location: [facility] ENDOSCOPY;  Service: Pulmonary;;  ? ? ?Current Medications: ?Current Outpatient Medications on File Prior to Visit  ?Medication Sig Dispense Refill  ? amLODipine (NORVASC) 10 MG tablet Take 10 mg by mouth daily.    ? anti-nausea (EMETROL) solution Take 10 mLs by mouth every 15 (fifteen) minutes as needed for nausea or vomiting.    ? apixaban (ELIQUIS) 5 MG TABS tablet Take 1 tablet (5 mg total) by mouth 2 (two) times daily. Okay to restart this medication on 07/25/2021 60 tablet 0  ? atorvastatin (LIPITOR) 40 MG tablet Take 1 tablet (40 mg total) by mouth daily. 30 tablet 0  ? bisacodyl (DULCOLAX) 5 MG EC tablet Take 5 mg by mouth daily as needed for moderate constipation.    ? FARXIGA 10 MG TABS  tablet Take 10 mg by mouth daily.    ? furosemide (LASIX) 20 MG tablet Take 20 mg by mouth daily as needed for fluid.    ? LORATADINE ALLERGY RELIEF PO Take by mouth.    ? olmesartan (BENICAR) 40 MG tablet Take 40 mg by mouth daily.    ? ondansetron (ZOFRAN-ODT) 4 MG disintegrating tablet Take 1 tablet (4 mg total) by mouth every 8 (eight) hours as needed for nausea or vomiting. 20 tablet 0  ? Vitamin D, Ergocalciferol, 50000 units CAPS Take by mouth.    ? ?No current facility-administered medications on file prior to visit.  ? ? ? ?Allergies:   Ivp dye [iodinated contrast media]  ? ?Social History  ? ?Socioeconomic  History  ? Marital status: Married  ?  Spouse name: Not on file  ? Number of children: Not on file  ? Years of education: Not on file  ? Highest education level: Not on file  ?Occupational History  ? Not on file  ?Tobacco Use  ? Smoking status: Never  ?  Passive exposure: Never  ? Smokeless tobacco: Never  ?Vaping Use  ? Vaping Use: Never used  ?Substance and Sexual Activity  ? Alcohol use: Not Currently  ? Drug use: Not Currently  ? Sexual activity: Not Currently  ?Other Topics Concern  ? Not on file  ?Social History Narrative  ? Not on file  ? ?Social Determinants of Health  ? ?Financial Resource Strain: Not on file  ?Food Insecurity: Not on file  ?Transportation Needs: Not on file  ?Physical Activity: Not on file  ?Stress: Not on file  ?Social Connections: Not on file  ?  ? ?Family History: ?The patient's family history includes Aneurysm in his sister; Diabetes in his mother and sister; Pancreatic cancer in his sister; Stroke in his father. ? ?ROS:   ?Please see the history of present illness.    ? All other systems reviewed and are negative. ? ?EKGs/Labs/Other Studies Reviewed:   ? ?The following studies were reviewed today: ? ? ?EKG:  EKG is  ordered today.  The ekg ordered today demonstrates  ? ?Atrial flutter with variable AV block 74 bpm ? ?Recent Labs: ?07/06/2021: Magnesium 2.4; TSH 1.119 ?07/07/2021: BUN 24; Creatinine, Ser 1.41; Hemoglobin 14.8; Platelets 209; Potassium 3.9; Sodium 138  ?Recent Lipid Panel ?   ?Component Value Date/Time  ? CHOL 157 07/07/2021 0424  ? TRIG 109 07/07/2021 0424  ? HDL 41 07/07/2021 0424  ? CHOLHDL 3.8 07/07/2021 0424  ? VLDL 22 07/07/2021 0424  ? [place] 94 07/07/2021 0424  ? ? ? ?Risk Assessment/Calculations:   ?  ? ?    ? ?Physical Exam:   ? ?VS:   ? ?Vitals:  ? 08/23/21 1112 08/23/21 1115  ?BP: 140/78 (!) 142/82  ?Pulse: 74   ?SpO2: 98%   ? ? ? ?Wt Readings from Last 3 Encounters:  ?08/23/21 244 lb 3.2 oz (110.8 kg)  ?08/18/21 248 lb (112.5 kg)  ?08/10/21 241 lb (109.3 kg)   ?  ? ?GEN:  Well nourished, well developed in no acute distress ?HEENT: Normal ?NECK: No JVD; No carotid bruits ?LYMPHATICS: No lymphadenopathy ?CARDIAC: RRR, no murmurs, rubs, gallops ?RESPIRATORY:  Clear to auscultation without rales, wheezing or rhonchi  ?ABDOMEN: Soft, non-tender, non-distended ?MUSCULOSKELETAL:  No edema; No deformity  ?SKIN: Warm and dry ?NEUROLOGIC:  Alert and oriented x 3 ?PSYCHIATRIC:  Normal affect  ? ?ASSESSMENT:   ? ?Typical Atrial Flutter: variable AV block. new  onset March 2023. If has recurrence, will consider Ep referral for CTI ablation. For now will aim to establish sinus rhythm with DCCV. He has taken his eliquis consistently. ?-continue eliquis will need to be on eliquis uninterrupted for 4 weeks post DCCV prior to holding for a procedure. ?-DCCV ?-if recurrence EP referral for ablation ? ?HTN: goal BP closer 120/80 mmHg with aneurysm. continue norvasc 10 mg daily. Continue olmesartan 40 mg daily. Start carvedilol 25 mg BID ? ?HLD: atorvastatin 40 mg daily ? ?Aortic ascending aneurysm 4.8 cm 06/2021, can repeat in 6 months. Will repeat with CT ? ?CardioOnc: with carcinoid tumor, will assess for symptoms of right sided valve dx with low threshold for repeat imaging ? ?PLAN:   ? ?In order of problems listed above: ? ?Start coreg 25 BID ?Recommend a sleep study with pulmonology ?DCCV ?Follow up in 3 months ? ?   ? ?Medication Adjustments/Labs and Tests Ordered: ?Current medicines are reviewed at length with the patient today.  Concerns regarding medicines are outlined above.  ?Orders Placed This Encounter  ?Procedures  ? Basic metabolic panel  ? CBC  ? EKG 12-Lead  ? Split night study  ? ?Meds ordered this encounter  ?Medications  ? carvedilol (COREG) 25 MG tablet  ?  Sig: Take 1 tablet (25 mg total) by mouth 2 (two) times daily.  ?  Dispense:  180 tablet  ?  Refill:  3  ? ? ?Patient Instructions  ?Medication Instructions:  ?START carvedilol (Coreg) 25 mg two times daily ? ?*If you  need a refill on your cardiac medications before your next appointment, please call your pharmacy* ? ? ?Lab Work: ?BMET, CBC today ? ?If you have labs (blood work) drawn today and your tests are completely

## 2021-08-24 ENCOUNTER — Ambulatory Visit (HOSPITAL_COMMUNITY)
Admission: RE | Admit: 2021-08-24 | Discharge: 2021-08-24 | Disposition: A | Discharge status: Home / Self Care | Payer: Medicare HMO | Source: Ambulatory Visit | Attending: Thoracic Surgery (Cardiothoracic Vascular Surgery) | Admitting: Thoracic Surgery (Cardiothoracic Vascular Surgery)

## 2021-08-24 ENCOUNTER — Telehealth (HOSPITAL_COMMUNITY): Payer: Self-pay

## 2021-08-24 ENCOUNTER — Encounter: Payer: Self-pay | Admitting: Emergency Medicine

## 2021-08-24 ENCOUNTER — Telehealth: Payer: Self-pay | Admitting: *Deleted

## 2021-08-24 ENCOUNTER — Encounter (HOSPITAL_COMMUNITY): Payer: Self-pay | Admitting: Cardiovascular Disease

## 2021-08-24 ENCOUNTER — Ambulatory Visit: Payer: Medicare HMO | Admitting: Emergency Medicine

## 2021-08-24 DIAGNOSIS — J449 Chronic obstructive pulmonary disease, unspecified: Secondary | ICD-10-CM | POA: Diagnosis not present

## 2021-08-24 DIAGNOSIS — D3A090 Benign carcinoid tumor of the bronchus and lung: Secondary | ICD-10-CM | POA: Diagnosis not present

## 2021-08-24 DIAGNOSIS — I7121 Aneurysm of the ascending aorta, without rupture: Secondary | ICD-10-CM | POA: Diagnosis not present

## 2021-08-24 LAB — BASIC METABOLIC PANEL
BUN/Creatinine Ratio: 13 (ref 10–24)
BUN: 18 mg/dL (ref 8–27)
CO2: 26 mmol/L (ref 20–29)
Calcium: 9.4 mg/dL (ref 8.6–10.2)
Chloride: 103 mmol/L (ref 96–106)
Creatinine, Ser: 1.42 mg/dL — ABNORMAL HIGH (ref 0.76–1.27)
Glucose: 238 mg/dL — ABNORMAL HIGH (ref 70–99)
Potassium: 4.6 mmol/L (ref 3.5–5.2)
Sodium: 143 mmol/L (ref 134–144)
eGFR: 53 mL/min/{1.73_m2} — ABNORMAL LOW (ref >59)

## 2021-08-24 LAB — CBC
Hematocrit: 41.9 % (ref 37.5–51.0)
Hemoglobin: 14.2 g/dL (ref 13.0–17.7)
MCH: 31.6 pg (ref 26.6–33.0)
MCHC: 33.9 g/dL (ref 31.5–35.7)
MCV: 93 fL (ref 79–97)
Platelets: 216 10*3/uL (ref 150–450)
RBC: 4.5 x10E6/uL (ref 4.14–5.80)
RDW: 12.5 % (ref 11.6–15.4)
WBC: 4.1 10*3/uL (ref 3.4–10.8)

## 2021-08-24 LAB — PULMONARY FUNCTION TEST
DL/VA % pred: 110 %
DL/VA: 4.42 ml/min/mmHg/L
DLCO cor % pred: 64 %
DLCO cor: 18.49 ml/min/mmHg
DLCO unc % pred: 64 %
DLCO unc: 18.28 ml/min/mmHg
FEF 25-75 Post: 1.52 L/sec
FEF 25-75 Pre: 1.57 L/sec
FEF2575-%Change-Post: -3 %
FEF2575-%Pred-Post: 53 %
FEF2575-%Pred-Pre: 55 %
FEV1-%Change-Post: 0 %
FEV1-%Pred-Post: 56 %
FEV1-%Pred-Pre: 56 %
FEV1-Post: 1.85 L
FEV1-Pre: 1.87 L
FEV1FVC-%Change-Post: -3 %
FEV1FVC-%Pred-Pre: 100 %
FEV6-%Change-Post: 1 %
FEV6-%Pred-Post: 59 %
FEV6-%Pred-Pre: 58 %
FEV6-Post: 2.46 L
FEV6-Pre: 2.43 L
FEV6FVC-%Change-Post: 0 %
FEV6FVC-%Pred-Post: 104 %
FEV6FVC-%Pred-Pre: 104 %
FVC-%Change-Post: 2 %
FVC-%Pred-Post: 57 %
FVC-%Pred-Pre: 56 %
FVC-Post: 2.49 L
FVC-Pre: 2.43 L
Post FEV1/FVC ratio: 75 %
Post FEV6/FVC ratio: 100 %
Pre FEV1/FVC ratio: 77 %
Pre FEV6/FVC Ratio: 100 %
RV % pred: 275 %
RV: 7.14 L
TLC % pred: 124 %
TLC: 9.53 L

## 2021-08-24 MED ORDER — ALBUTEROL SULFATE (2.5 MG/3ML) 0.083% IN NEBU
2.5000 mg | INHALATION_SOLUTION | Freq: Once | RESPIRATORY_TRACT | Status: AC
  Administered 2021-08-24: 2.5 mg via RESPIRATORY_TRACT

## 2021-08-24 NOTE — Patient Instructions (Signed)
We reviewed your pulmonary function testing today. We will hold off on starting any inhaled medication at this time.  If you develop shortness of breath, have a change in your functional capacity then we can consider doing so in the future. Agree with cardiology follow-up and possible cardioversion per their plans. Agree with thoracic surgery follow-up and resection of a carcinoid tumor depending on Dr. Abran Duke recommendations. Follow with Dr Lamonte Sakai in 4 months or sooner if you have any problems.

## 2021-08-24 NOTE — Telephone Encounter (Signed)
Prior Authorization for split night sleep study sent to [facility] via web portal. Auth Number [id number].

## 2021-08-24 NOTE — Assessment & Plan Note (Signed)
Mixed obstruction and restriction on pulmonary function testing, no exertional dyspnea.  Discussed bronchodilators with him today and we will hold off for now.  If he develops shortness of breath then he would be a candidate to start BD.

## 2021-08-24 NOTE — Telephone Encounter (Signed)
Detailed instructions left on the patient's answering machine. Asked to call back with any questions. S.Geno Sydnor EMTP 

## 2021-08-24 NOTE — Progress Notes (Signed)
Subjective:    Patient ID: Chris Lucas, male    DOB: 12-09-1950, 71 y.o.   MRN: [id number]  HPI  71 year old never smoker with a history of hypertension.  He was seen in the independent emergency department and was admitted to [facility] with nausea, fatigue, weakness and decreased appetite.  He was in atrial flutter and was found to have a acute/subacute CVA.  Part of his evaluation included chest x-ray and subsequently a CT scan of the chest as below.  He was started on Eliquis.  He is referred for an abnormal CT scan of the chest. He reports that he is feeling well. No CP. No cough.   CT chest 07/06/2021 reviewed by me shows a macrolobulated solid left upper lobe mass 3.5 x 3.1 cm.  There is some bibasilar atelectasis and a tiny calcified lingular granuloma  MRI brain 07/06/2021 showed 4 mm acute nonischemic infarct.  No evidence of metastatic disease   ROV 08/24/21 --Mr. Thelin is a 71 year old gentleman with history of hypertension.  He is a never smoker.  I saw him for an abnormal CT scan of the chest done 07/06/2021.  He underwent bronchoscopy identified typical carcinoid tumor.  He has since seen Dr. Kipp Brood with thoracic surgery and is being considered for possible surgical resection, nuclear scanning to look for any evidence of distant disease.  He underwent pulmonary function testing today as below.  He is not on any bronchodilator therapy  Sniff test 08/23/2021 reviewed by me showed moderate left HD elevation (similar to 2008) with mild to moderate paresis relative to the right but no paralysis.  Pulmonary function testing performed today and reviewed by me show mixed obstruction and restriction with a significantly decreased FEV1 56% predicted.  Significant hyperinflation on lung volumes consistent with emphysema.  Decreased diffusion capacity that corrects to normal range when adjusted for his alveolar volume.  Review of Systems As per HPI  Past Medical History:  Diagnosis  Date   Atrial flutter ([place]) 07/06/2021   HLD (hyperlipidemia)    Hypertension    Mass of upper lobe of left lung 07/2021   solid   Pre-diabetes    diet controlled   Stroke [facility]) 07/06/2021   Thoracic ascending aortic aneurysm ([place])      Family History  Problem Relation Age of Onset   Diabetes Mother    Stroke Father    Diabetes Sister    Aneurysm Sister    Pancreatic cancer Sister      Social History   Socioeconomic History   Marital status: Married    Spouse name: Not on file   Number of children: Not on file   Years of education: Not on file   Highest education level: Not on file  Occupational History   Not on file  Tobacco Use   Smoking status: Never    Passive exposure: Never   Smokeless tobacco: Never  Vaping Use   Vaping Use: Never used  Substance and Sexual Activity   Alcohol use: Not Currently   Drug use: Not Currently   Sexual activity: Not Currently  Other Topics Concern   Not on file  Social History Narrative   Not on file   Social Determinants of Health   Financial Resource Strain: Not on file  Food Insecurity: Not on file  Transportation Needs: Not on file  Physical Activity: Not on file  Stress: Not on file  Social Connections: Not on file  Intimate Partner Violence: Not on file  Allergies  Allergen Reactions   Ivp Dye [Iodinated Contrast Media]      Outpatient Medications Prior to Visit  Medication Sig Dispense Refill   amLODipine (NORVASC) 10 MG tablet Take 10 mg by mouth daily.     anti-nausea (EMETROL) solution Take 10 mLs by mouth every 15 (fifteen) minutes as needed for nausea or vomiting.     bisacodyl (DULCOLAX) 5 MG EC tablet Take 5 mg by mouth daily as needed for moderate constipation.     carvedilol (COREG) 25 MG tablet Take 1 tablet (25 mg total) by mouth 2 (two) times daily. 180 tablet 3   FARXIGA 10 MG TABS tablet Take 10 mg by mouth daily.     furosemide (LASIX) 20 MG tablet Take 20 mg by mouth daily as needed for  fluid.     LORATADINE ALLERGY RELIEF PO Take by mouth.     olmesartan (BENICAR) 40 MG tablet Take 40 mg by mouth daily.     ondansetron (ZOFRAN-ODT) 4 MG disintegrating tablet Take 1 tablet (4 mg total) by mouth every 8 (eight) hours as needed for nausea or vomiting. 20 tablet 0   Vitamin D, Ergocalciferol, 50000 units CAPS Take by mouth.     apixaban (ELIQUIS) 5 MG TABS tablet Take 1 tablet (5 mg total) by mouth 2 (two) times daily. Okay to restart this medication on 07/25/2021 60 tablet 0   atorvastatin (LIPITOR) 40 MG tablet Take 1 tablet (40 mg total) by mouth daily. 30 tablet 0   No facility-administered medications prior to visit.         Objective:   Physical Exam Vitals:   08/24/21 1200  BP: 136/74  Pulse: 67  Temp: 97.9 F (36.6 C)  TempSrc: Oral  SpO2: 96%  Weight: 245 lb 3.2 oz (111.2 kg)  Height: 6\' 1"  (1.854 m)   Gen: Pleasant, well-nourished, in no distress,  normal affect  ENT: No lesions,  mouth clear,  oropharynx clear, no postnasal drip  Neck: No JVD, no stridor  Lungs: No use of accessory muscles, no crackles or wheezing on normal respiration, no wheeze on forced expiration  Cardiovascular: RRR, heart sounds normal, no murmur or gallops, no peripheral edema  Musculoskeletal: No deformities, no cyanosis or clubbing  Neuro: alert, awake, non focal  Skin: Warm, no lesions or rash      Assessment & Plan:  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  He has seen Dr. Kipp Brood.  Nuclear scan pending to ensure no evidence of distant disease but unlikely given the pathology.  Dr. Kipp Brood has recommended resection given the size of the mass and I agree.  I explained to him today that this is likely benign finding but that it has a potential to convert over to atypical carcinoid which is more complicated.  He understands.    COPD (chronic obstructive pulmonary disease) (HCC) Mixed obstruction and restriction on pulmonary function testing,  no exertional dyspnea.  Discussed bronchodilators with him today and we will hold off for now.  If he develops shortness of breath then he would be a candidate to start BD.   Baltazar Apo, MD, PhD 08/24/2021, 5:13 PM [facility] [phone] or if no answer before 7:00PM call [phone] For any issues after 7:00PM please call eLink [phone]

## 2021-08-24 NOTE — Assessment & Plan Note (Signed)
Pathology consistent with typical carcinoid.  He has seen Dr. Kipp Brood.  Nuclear scan pending to ensure no evidence of distant disease but unlikely given the pathology.  Dr. Kipp Brood has recommended resection given the size of the mass and I agree.  I explained to him today that this is likely benign finding but that it has a potential to convert over to atypical carcinoid which is more complicated.  He understands.

## 2021-08-28 ENCOUNTER — Telehealth (HOSPITAL_COMMUNITY): Payer: Self-pay | Admitting: *Deleted

## 2021-08-28 NOTE — Telephone Encounter (Signed)
Patient given detailed instructions per Myocardial Perfusion Study Information Sheet for the test on 08/29/21 Patient notified to arrive 15 minutes early and that it is imperative to arrive on time for appointment to keep from having the test rescheduled.  If you need to cancel or reschedule your appointment, please call the office within 24 hours of your appointment. . Patient verbalized understanding. Kirstie Peri, RN

## 2021-08-29 ENCOUNTER — Ambulatory Visit (HOSPITAL_COMMUNITY): Payer: Medicare HMO | Attending: Thoracic Surgery (Cardiothoracic Vascular Surgery)

## 2021-08-29 DIAGNOSIS — I10 Essential (primary) hypertension: Secondary | ICD-10-CM | POA: Insufficient documentation

## 2021-08-29 DIAGNOSIS — R0609 Other forms of dyspnea: Secondary | ICD-10-CM | POA: Insufficient documentation

## 2021-08-29 DIAGNOSIS — R55 Syncope and collapse: Secondary | ICD-10-CM | POA: Diagnosis not present

## 2021-08-29 DIAGNOSIS — I4892 Unspecified atrial flutter: Secondary | ICD-10-CM | POA: Diagnosis not present

## 2021-08-29 DIAGNOSIS — R002 Palpitations: Secondary | ICD-10-CM | POA: Diagnosis not present

## 2021-08-29 DIAGNOSIS — E785 Hyperlipidemia, unspecified: Secondary | ICD-10-CM | POA: Diagnosis not present

## 2021-08-29 DIAGNOSIS — I4891 Unspecified atrial fibrillation: Secondary | ICD-10-CM | POA: Insufficient documentation

## 2021-08-29 DIAGNOSIS — I483 Typical atrial flutter: Secondary | ICD-10-CM | POA: Diagnosis not present

## 2021-08-29 DIAGNOSIS — I493 Ventricular premature depolarization: Secondary | ICD-10-CM | POA: Diagnosis not present

## 2021-08-29 DIAGNOSIS — I712 Thoracic aortic aneurysm, without rupture, unspecified: Secondary | ICD-10-CM | POA: Diagnosis not present

## 2021-08-29 DIAGNOSIS — Z01818 Encounter for other preprocedural examination: Secondary | ICD-10-CM

## 2021-08-29 DIAGNOSIS — I7121 Aneurysm of the ascending aorta, without rupture: Secondary | ICD-10-CM | POA: Insufficient documentation

## 2021-08-29 DIAGNOSIS — Z01810 Encounter for preprocedural cardiovascular examination: Secondary | ICD-10-CM | POA: Diagnosis not present

## 2021-08-29 DIAGNOSIS — Z8673 Personal history of transient ischemic attack (TIA), and cerebral infarction without residual deficits: Secondary | ICD-10-CM | POA: Diagnosis not present

## 2021-08-29 LAB — MYOCARDIAL PERFUSION IMAGING
LV dias vol: 96 mL (ref 62–150)
LV sys vol: 35 mL
Nuc Stress EF: 63 %
Peak HR: 68 {beats}/min
Rest HR: 59 {beats}/min
Rest Nuclear Isotope Dose: 10.1 mCi
SDS: 3
SRS: 0
SSS: 3
ST Depression (mm): 0 mm
Stress Nuclear Isotope Dose: 32.6 mCi
TID: 0.98

## 2021-08-29 MED ORDER — TECHNETIUM TC 99M TETROFOSMIN IV KIT
10.1000 | PACK | Freq: Once | INTRAVENOUS | Status: AC | PRN
  Administered 2021-08-29: 10.1 via INTRAVENOUS

## 2021-08-29 MED ORDER — REGADENOSON 0.4 MG/5ML IV SOLN
0.4000 mg | Freq: Once | INTRAVENOUS | Status: AC
  Administered 2021-08-29: 0.4 mg via INTRAVENOUS

## 2021-08-29 MED ORDER — TECHNETIUM TC 99M TETROFOSMIN IV KIT
32.6000 | PACK | Freq: Once | INTRAVENOUS | Status: AC | PRN
  Administered 2021-08-29: 32.6 via INTRAVENOUS

## 2021-08-31 ENCOUNTER — Other Ambulatory Visit: Payer: Self-pay | Admitting: *Deleted

## 2021-08-31 ENCOUNTER — Encounter (HOSPITAL_COMMUNITY)
Admission: RE | Admit: 2021-08-31 | Discharge: 2021-08-31 | Disposition: A | Discharge status: Home / Self Care | Payer: Medicare HMO | Source: Ambulatory Visit | Attending: Thoracic Surgery (Cardiothoracic Vascular Surgery) | Admitting: Thoracic Surgery (Cardiothoracic Vascular Surgery)

## 2021-08-31 DIAGNOSIS — I7121 Aneurysm of the ascending aorta, without rupture: Secondary | ICD-10-CM | POA: Diagnosis not present

## 2021-08-31 DIAGNOSIS — D3A090 Benign carcinoid tumor of the bronchus and lung: Secondary | ICD-10-CM | POA: Insufficient documentation

## 2021-08-31 DIAGNOSIS — C7A090 Malignant carcinoid tumor of the bronchus and lung: Secondary | ICD-10-CM | POA: Diagnosis not present

## 2021-08-31 DIAGNOSIS — R918 Other nonspecific abnormal finding of lung field: Secondary | ICD-10-CM | POA: Insufficient documentation

## 2021-08-31 DIAGNOSIS — I483 Typical atrial flutter: Secondary | ICD-10-CM

## 2021-08-31 MED ORDER — COPPER CU 64 DOTATATE 1 MCI/ML IV SOLN
4.0000 | Freq: Once | INTRAVENOUS | Status: AC
  Administered 2021-08-31: 4.15 via INTRAVENOUS

## 2021-09-01 ENCOUNTER — Encounter (HOSPITAL_COMMUNITY)
Admission: RE | Disposition: A | Discharge status: Home / Self Care | Payer: Self-pay | Source: Home / Self Care | Attending: Cardiovascular Disease

## 2021-09-01 ENCOUNTER — Encounter (HOSPITAL_COMMUNITY): Payer: Self-pay | Admitting: Cardiovascular Disease

## 2021-09-01 ENCOUNTER — Ambulatory Visit (HOSPITAL_COMMUNITY): Payer: Medicare HMO | Admitting: Anesthesiology

## 2021-09-01 ENCOUNTER — Ambulatory Visit (INDEPENDENT_AMBULATORY_CARE_PROVIDER_SITE_OTHER): Payer: Medicare HMO | Admitting: Thoracic Surgery (Cardiothoracic Vascular Surgery)

## 2021-09-01 ENCOUNTER — Ambulatory Visit (HOSPITAL_COMMUNITY)
Admission: RE | Admit: 2021-09-01 | Discharge: 2021-09-01 | Disposition: A | Discharge status: Home / Self Care | Payer: Medicare HMO | Attending: Cardiovascular Disease | Admitting: Cardiovascular Disease

## 2021-09-01 ENCOUNTER — Ambulatory Visit (HOSPITAL_BASED_OUTPATIENT_CLINIC_OR_DEPARTMENT_OTHER): Payer: Medicare HMO | Admitting: Anesthesiology

## 2021-09-01 DIAGNOSIS — I4892 Unspecified atrial flutter: Secondary | ICD-10-CM

## 2021-09-01 DIAGNOSIS — Z8673 Personal history of transient ischemic attack (TIA), and cerebral infarction without residual deficits: Secondary | ICD-10-CM | POA: Insufficient documentation

## 2021-09-01 DIAGNOSIS — D3A090 Benign carcinoid tumor of the bronchus and lung: Secondary | ICD-10-CM | POA: Diagnosis not present

## 2021-09-01 DIAGNOSIS — Z7901 Long term (current) use of anticoagulants: Secondary | ICD-10-CM | POA: Insufficient documentation

## 2021-09-01 DIAGNOSIS — I483 Typical atrial flutter: Secondary | ICD-10-CM | POA: Diagnosis not present

## 2021-09-01 DIAGNOSIS — E1151 Type 2 diabetes mellitus with diabetic peripheral angiopathy without gangrene: Secondary | ICD-10-CM | POA: Diagnosis not present

## 2021-09-01 DIAGNOSIS — I10 Essential (primary) hypertension: Secondary | ICD-10-CM | POA: Diagnosis not present

## 2021-09-01 DIAGNOSIS — E1122 Type 2 diabetes mellitus with diabetic chronic kidney disease: Secondary | ICD-10-CM | POA: Insufficient documentation

## 2021-09-01 DIAGNOSIS — N189 Chronic kidney disease, unspecified: Secondary | ICD-10-CM | POA: Insufficient documentation

## 2021-09-01 DIAGNOSIS — I493 Ventricular premature depolarization: Secondary | ICD-10-CM | POA: Insufficient documentation

## 2021-09-01 DIAGNOSIS — I129 Hypertensive chronic kidney disease with stage 1 through stage 4 chronic kidney disease, or unspecified chronic kidney disease: Secondary | ICD-10-CM | POA: Insufficient documentation

## 2021-09-01 DIAGNOSIS — E785 Hyperlipidemia, unspecified: Secondary | ICD-10-CM | POA: Insufficient documentation

## 2021-09-01 DIAGNOSIS — I739 Peripheral vascular disease, unspecified: Secondary | ICD-10-CM | POA: Diagnosis not present

## 2021-09-01 DIAGNOSIS — I7121 Aneurysm of the ascending aorta, without rupture: Secondary | ICD-10-CM | POA: Insufficient documentation

## 2021-09-01 DIAGNOSIS — J449 Chronic obstructive pulmonary disease, unspecified: Secondary | ICD-10-CM

## 2021-09-01 HISTORY — PX: CARDIOVERSION: SHX1299

## 2021-09-01 SURGERY — CARDIOVERSION
Anesthesia: General

## 2021-09-01 MED ORDER — LIDOCAINE 2% (20 MG/ML) 5 ML SYRINGE
INTRAMUSCULAR | Status: DC | PRN
  Administered 2021-09-01: 60 mg via INTRAVENOUS

## 2021-09-01 MED ORDER — PROPOFOL 10 MG/ML IV BOLUS
INTRAVENOUS | Status: DC | PRN
  Administered 2021-09-01: 20 mg via INTRAVENOUS
  Administered 2021-09-01: 70 mg via INTRAVENOUS

## 2021-09-01 MED ORDER — SODIUM CHLORIDE 0.9 % IV SOLN: INTRAVENOUS | Status: DC

## 2021-09-01 NOTE — Interval H&P Note (Signed)
History and Physical Interval Note:  09/01/2021 8:43 AM  Chris Lucas  has presented today for surgery, with the diagnosis of AFLUTTER.  The various methods of treatment have been discussed with the patient and family. After consideration of risks, benefits and other options for treatment, the patient has consented to  Procedure(s): CARDIOVERSION (N/A) as a surgical intervention.  The patient's history has been reviewed, patient examined, no change in status, stable for surgery.  I have reviewed the patient's chart and labs.  Questions were answered to the patient's satisfaction.     [name], MD

## 2021-09-01 NOTE — CV Procedure (Signed)
Electrical Cardioversion Procedure Note Chris Lucas [id number] December 23, 1950  Procedure: Electrical Cardioversion Indications:  Atrial Flutter  Procedure Details Consent: Risks of procedure as well as the alternatives and risks of each were explained to the (patient/caregiver).  Consent for procedure obtained. Time Out: Verified patient identification, verified procedure, site/side was marked, verified correct patient position, special equipment/implants available, medications/allergies/relevent history reviewed, required imaging and test results available.  Performed  Patient placed on cardiac monitor, pulse oximetry, supplemental oxygen as necessary.  Sedation given:  propofol Pacer pads placed anterior and posterior chest.  Cardioverted 1 time(s).  Cardioverted at 150J.  Evaluation Findings: Post procedure EKG shows: NSR Complications: None Patient did tolerate procedure well.   Chris Latch, MD 09/01/2021, 8:58 AM

## 2021-09-01 NOTE — Discharge Instructions (Signed)

## 2021-09-01 NOTE — Progress Notes (Signed)
     [address]       [place] [address]             [phone]       Chris Lucas came in to discuss the results of his previous studies prior to going on his trip.  Stress test was negative.  His dotatate scan showed uptake in the carcinoid tumor.  His sniff test showed some mild movement in his diaphragm indicating that it was not completely paralyzed.  He will follow-up after his trip to schedule surgery.  Ashon Rosenberg [name]

## 2021-09-01 NOTE — Anesthesia Preprocedure Evaluation (Signed)
Anesthesia Evaluation  Patient identified by MRN, date of birth, ID band Patient awake    Reviewed: Allergy & Precautions, H&P , NPO status , Patient's Chart, lab work & pertinent test results  Airway Mallampati: II   Neck ROM: full    Dental   Pulmonary COPD,  Lung mass   breath sounds clear to auscultation       Cardiovascular hypertension, + Peripheral Vascular Disease  + dysrhythmias Atrial Fibrillation  Rhythm:irregular Rate:Normal     Neuro/Psych CVA    GI/Hepatic   Endo/Other    Renal/GU      Musculoskeletal   Abdominal   Peds  Hematology   Anesthesia Other Findings   Reproductive/Obstetrics                             Anesthesia Physical Anesthesia Plan  ASA: 3  Anesthesia Plan: General   Post-op Pain Management:    Induction: Intravenous  PONV Risk Score and Plan: 2 and Propofol infusion and Treatment may vary due to age or medical condition  Airway Management Planned: Mask  Additional Equipment:   Intra-op Plan:   Post-operative Plan:   Informed Consent: I have reviewed the patients History and Physical, chart, labs and discussed the procedure including the risks, benefits and alternatives for the proposed anesthesia with the patient or authorized representative who has indicated his/her understanding and acceptance.     Dental advisory given  Plan Discussed with: CRNA and Anesthesiologist  Anesthesia Plan Comments:         Anesthesia Quick Evaluation

## 2021-09-01 NOTE — Transfer of Care (Signed)
Immediate Anesthesia Transfer of Care Note  Patient: Chris Lucas  Procedure(s) Performed: CARDIOVERSION  Patient Location: Endoscopy Unit  Anesthesia Type:General  Level of Consciousness: drowsy and patient cooperative  Airway & Oxygen Therapy: Patient Spontanous Breathing and Patient connected to nasal cannula oxygen  Post-op Assessment: Report given to RN, Post -op Vital signs reviewed and stable and Patient moving all extremities  Post vital signs: Reviewed and stable  Last Vitals:  Vitals Value Taken Time  BP    Temp    Pulse 75 09/01/21 0904  Resp 26 09/01/21 0904  SpO2 93 % 09/01/21 0904  Vitals shown include unvalidated device data.  Last Pain:  Vitals:   09/01/21 0804  TempSrc: Tympanic  PainSc: 0-No pain         Complications: No notable events documented.

## 2021-09-01 NOTE — Anesthesia Postprocedure Evaluation (Signed)
Anesthesia Post Note  Patient: Chris Lucas  Procedure(s) Performed: CARDIOVERSION     Patient location during evaluation: Endoscopy Anesthesia Type: General Level of consciousness: awake and alert Pain management: pain level controlled Vital Signs Assessment: post-procedure vital signs reviewed and stable Respiratory status: spontaneous breathing, nonlabored ventilation, respiratory function stable and patient connected to nasal cannula oxygen Cardiovascular status: blood pressure returned to baseline and stable Postop Assessment: no apparent nausea or vomiting Anesthetic complications: no   No notable events documented.  Last Vitals:  Vitals:   09/01/21 0915 09/01/21 0923  BP: (!) 145/97 (!) 157/96  Pulse: 76 (!) 52  Resp: 18 18  Temp:    SpO2: 99% 97%    Last Pain:  Vitals:   09/01/21 0923  TempSrc:   PainSc: 0-No pain                 Sundiata Ferrick S

## 2021-09-05 ENCOUNTER — Encounter (HOSPITAL_COMMUNITY): Payer: Self-pay | Admitting: Cardiovascular Disease

## 2021-09-08 ENCOUNTER — Telehealth: Payer: Medicare HMO | Admitting: Thoracic Surgery (Cardiothoracic Vascular Surgery)

## 2021-09-21 DIAGNOSIS — N400 Enlarged prostate without lower urinary tract symptoms: Secondary | ICD-10-CM | POA: Diagnosis not present

## 2021-09-21 DIAGNOSIS — R972 Elevated prostate specific antigen [PSA]: Secondary | ICD-10-CM | POA: Diagnosis not present

## 2021-09-21 DIAGNOSIS — N4232 Atypical small acinar proliferation of prostate: Secondary | ICD-10-CM | POA: Diagnosis not present

## 2021-09-28 ENCOUNTER — Other Ambulatory Visit: Payer: Self-pay | Admitting: Urology

## 2021-09-28 DIAGNOSIS — R972 Elevated prostate specific antigen [PSA]: Secondary | ICD-10-CM

## 2021-09-28 DIAGNOSIS — N4232 Atypical small acinar proliferation of prostate: Secondary | ICD-10-CM

## 2021-09-29 ENCOUNTER — Ambulatory Visit: Payer: Medicare HMO | Admitting: Thoracic Surgery (Cardiothoracic Vascular Surgery)

## 2021-09-29 ENCOUNTER — Other Ambulatory Visit: Payer: Self-pay | Admitting: *Deleted

## 2021-09-29 ENCOUNTER — Encounter: Payer: Self-pay | Admitting: *Deleted

## 2021-09-29 VITALS — BP 155/94 | HR 69 | Resp 18 | Ht 73.0 in | Wt 255.0 lb

## 2021-09-29 DIAGNOSIS — D3A090 Benign carcinoid tumor of the bronchus and lung: Secondary | ICD-10-CM

## 2021-09-29 NOTE — H&P (View-Only) (Signed)
[address]       [place] [address]             [phone]                                                   Khalel M Strayer [place] Medical Record #[id number] Date of Birth: 1950/11/01   Referring: [name], MD Primary Care: [name], MD Primary Cardiologist: None   Chief Complaint:        Chief Complaint  Patient presents with   Thoracic Aortic Aneurysm      Initial surgical consult, CT chest 3/30      History of Present Illness:    Chris Lucas 71 y.o. male presents for surgical evaluation of a left upper lobe carcinoid tumor, ascending aortic aneurysm, and chronically elevated left hemidiaphragm.  He has previously been seen by Dr. 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  He denies any respiratory symptoms.  He is a lifelong non-smoker, and remains very active with weightlifting and exercise.   This nodule was originally found incidentally when he presented with a stroke to come.  He was then noted to have a 3.5 cm left upper lobe macrolobulated mass.  During this hospitalization he was also noted to be in atrial fibrillation and was placed on Eliquis.   He denies any shortness of breath, or coughs.  He denies any chest pain.  He has had some lower extremity swelling recently and was placed on Lasix.           Zubrod Score: At the time of surgery this patient's most appropriate activity status/level should be described as: [x]     0    Normal activity, no symptoms []     1    Restricted in physical strenuous activity but ambulatory, able to do out light work []     2    Ambulatory and capable of self care, unable to do work activities, up and about               >50 % of waking hours                              []     3    Only limited self care, in bed greater than 50% of waking hours []     4    Completely disabled, no self care, confined  to bed or chair []     5    Moribund         Past Medical History:  Diagnosis Date   Atrial flutter ([place]) 07/06/2021   HLD (hyperlipidemia)     Hypertension     Mass of upper lobe of left lung 07/2021    solid   Pre-diabetes      diet controlled   Stroke ([place]) 07/06/2021   Thoracic ascending aortic aneurysm [facility])             Past Surgical History:  Procedure Laterality Date   BRONCHIAL BIOPSY   07/24/2021    Procedure: BRONCHIAL BIOPSIES;  Surgeon: Collene Gobble, MD;  Location: [place];  Service: Pulmonary;;   BRONCHIAL BRUSHINGS   07/24/2021  Procedure: BRONCHIAL BRUSHINGS;  Surgeon: Collene Gobble, MD;  Location: [facility] ENDOSCOPY;  Service: Pulmonary;;   BRONCHIAL NEEDLE ASPIRATION BIOPSY   07/24/2021    Procedure: BRONCHIAL NEEDLE ASPIRATION BIOPSIES;  Surgeon: Collene Gobble, MD;  Location: [facility] ENDOSCOPY;  Service: Pulmonary;;   CARDIAC CATHETERIZATION        "Years ago"  (apporx 18-20 yrs)   colonoscopy       VIDEO BRONCHOSCOPY WITH RADIAL ENDOBRONCHIAL ULTRASOUND   07/24/2021    Procedure: VIDEO BRONCHOSCOPY WITH RADIAL ENDOBRONCHIAL ULTRASOUND;  Surgeon: Collene Gobble, MD;  Location: [facility] ENDOSCOPY;  Service: Pulmonary;;           Family History  Problem Relation Age of Onset   Diabetes Mother          Social History       Tobacco Use  Smoking Status Never  Smokeless Tobacco Never    Social History       Substance and Sexual Activity  Alcohol Use Not Currently            Allergies  Allergen Reactions   Ivp Dye [Iodinated Contrast Media]              Current Outpatient Medications  Medication Sig Dispense Refill   amLODipine (NORVASC) 10 MG tablet Take 10 mg by mouth daily.       anti-nausea (EMETROL) solution Take 10 mLs by mouth every 15 (fifteen) minutes as needed for nausea or vomiting.       apixaban (ELIQUIS) 5 MG TABS tablet Take 1 tablet (5 mg total) by mouth 2 (two) times daily. Okay to restart this medication on 07/25/2021 60 tablet 0    bisacodyl (DULCOLAX) 5 MG EC tablet Take 5 mg by mouth daily as needed for moderate constipation.       FARXIGA 10 MG TABS tablet Take 10 mg by mouth daily.       furosemide (LASIX) 20 MG tablet Take 20 mg by mouth daily as needed for fluid.       olmesartan (BENICAR) 40 MG tablet Take 40 mg by mouth daily.       ondansetron (ZOFRAN-ODT) 4 MG disintegrating tablet Take 1 tablet (4 mg total) by mouth every 8 (eight) hours as needed for nausea or vomiting. 20 tablet 0   Vitamin D, Ergocalciferol, 50000 units CAPS Take by mouth.       atorvastatin (LIPITOR) 40 MG tablet Take 1 tablet (40 mg total) by mouth daily. 30 tablet 0    No current facility-administered medications for this visit.      Review of Systems  Constitutional:  Negative for fever, malaise/fatigue and weight loss.  Respiratory:  Negative for cough and shortness of breath.   Cardiovascular:  Positive for palpitations and leg swelling. Negative for chest pain.  Neurological: Negative.       PHYSICAL EXAMINATION: Vitals:   09/29/21 0842  BP: (!) 155/94  Pulse: 69  Resp: 18  SpO2: 90%    Physical Exam Constitutional:      General: He is not in acute distress.    Appearance: Normal appearance. He is normal weight. He is not ill-appearing.  HENT:     Head: Normocephalic and atraumatic.  Cardiovascular:     Rate and Rhythm: Normal rate.  Regular rhythm    Pulses: Normal pulses.  Pulmonary:     Effort: Pulmonary effort is normal. No respiratory distress.  Musculoskeletal:        General: Normal range of motion.  Cervical back: Normal range of motion.  Skin:    General: Skin is warm and dry.  Neurological:     General: No focal deficit present.     Mental Status: He is alert and oriented to person, place, and time.      Diagnostic Studies & Laboratory data:     Recent Radiology Findings:    Imaging Results  DG Chest Port 1 View   Result Date: 07/24/2021 CLINICAL DATA:  Status post bronchoscopy with  biopsy, left upper lobe mass EXAM: PORTABLE CHEST 1 VIEW COMPARISON:  07/06/2021 FINDINGS: Single frontal view of the chest demonstrates an unremarkable cardiac silhouette. Lung volumes are diminished, with chronic elevation of the left hemidiaphragm. The left upper lobe mass abutting the major fissure on prior study is again identified. Hypoventilatory changes are seen at the lung bases, with lower lobe atelectasis. No new airspace disease, effusion, or pneumothorax identified. IMPRESSION: 1. Low lung volumes with bilateral lower lobe atelectasis. 2. No complication after bronchoscopy and biopsy. No evidence of pneumothorax. 3. 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 Electronically Signed   By: Randa Ngo M.D.   On: 07/24/2021 15:35    DG C-ARM BRONCHOSCOPY   Result Date: 07/24/2021 C-ARM BRONCHOSCOPY: Fluoroscopy was utilized by the requesting physician.  No radiographic interpretation.           I have independently reviewed the above radiology studies  and reviewed the findings with the patient.    Recent Lab Findings: Recent Labs       Lab Results  Component Value Date    WBC 5.9 07/07/2021    HGB 14.8 07/07/2021    HCT 45.0 07/07/2021    PLT 209 07/07/2021    GLUCOSE 123 (H) 07/07/2021    CHOL 157 07/07/2021    TRIG 109 07/07/2021    HDL 41 07/07/2021    LDLCALC 94 07/07/2021    ALT 20 05/27/2008    AST 18 05/27/2008    NA 138 07/07/2021    K 3.9 07/07/2021    CL 103 07/07/2021    CREATININE 1.41 (H) 07/07/2021    BUN 24 (H) 07/07/2021    CO2 27 07/07/2021    TSH 1.119 07/06/2021    HGBA1C 7.4 (H) 07/07/2021          PFTs:    Latest Reference Range & Units 08/24/21 08:47  FVC-Pre L 2.43  FVC-%Pred-Pre % 56  FEV1-Pre L 1.87  FEV1-%Pred-Pre % 56  Pre FEV1/FVC ratio % 77  FEV1FVC-%Pred-Pre % 100  FEF 25-75 Pre L/sec 1.57  FEF2575-%Pred-Pre % 55  FEV6-Pre L 2.43  FEV6-%Pred-Pre % 58  Pre FEV6/FVC Ratio % 100  FEV6FVC-%Pred-Pre % 104   FVC-Post L 2.49  FVC-%Pred-Post % 57  FVC-%Change-Post % 2  FEV1-Post L 1.85  FEV1-%Pred-Post % 56  FEV1-%Change-Post % 0  Post FEV1/FVC ratio % 75  FEV1FVC-%Change-Post % -3  FEF 25-75 Post L/sec 1.52  FEF2575-%Pred-Post % 53  FEF2575-%Change-Post % -3  FEV6-Post L 2.46  FEV6-%Pred-Post % 59  FEV6-%Change-Post % 1  Post FEV6/FVC ratio % 100  FEV6FVC-%Pred-Post % 104  FEV6FVC-%Change-Post % 0  TLC L 9.53  TLC % pred % 124  RV L 7.14  RV % pred % 275  DLCO cor ml/min/mmHg 18.49  DLCO cor % pred % 64  DLCO unc ml/min/mmHg 18.28  DLCO unc % pred % 64  DL/VA ml/min/mmHg/L 4.42  DL/VA % pred % 110   Problem List: 4.8 cm ascending aortic  aneurysm 3.5 cm left upper lobe carcinoid tumor. Elevated left hemidiaphragm.     Assessment / Plan:   71 year old male with history of cerebrovascular event, was found to have a carcinoid tumor in his left upper lobe as well as a 4.8 cm ascending aortic aneurysm.  We reviewed the cross-sectional imaging the mass is quite central thus could have a lobectomy.  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  There is no indication for surgical repair at this point.     In regards to the carcinoid tumor, his pulmonary function shows decreased numbers, but acceptable.  This is likely due to his chronically elevated left hemidiaphragm.  His sniff testing did show some motion, which indicates that his diaphragm is not paralyzed.  We discussed the risks and benefits of left robotic assisted thoracoscopy with left upper lobectomy.  He is agreeable to proceed.  We will assess his symptoms postoperatively to determine if anything needs to be done to his left hemidiaphragm in the future.  He will also need to be off of Eliquis for 3 days prior to surgery.  I  spent 20 minutes with  the patient face to face in counseling and coordination of care.     Yasmin Dibello [name]

## 2021-10-17 DIAGNOSIS — I712 Thoracic aortic aneurysm, without rupture, unspecified: Secondary | ICD-10-CM | POA: Diagnosis not present

## 2021-10-17 DIAGNOSIS — I4892 Unspecified atrial flutter: Secondary | ICD-10-CM | POA: Diagnosis not present

## 2021-10-17 DIAGNOSIS — D381 Neoplasm of uncertain behavior of trachea, bronchus and lung: Secondary | ICD-10-CM | POA: Diagnosis not present

## 2021-10-17 DIAGNOSIS — N1831 Chronic kidney disease, stage 3a: Secondary | ICD-10-CM | POA: Diagnosis not present

## 2021-10-17 DIAGNOSIS — E1165 Type 2 diabetes mellitus with hyperglycemia: Secondary | ICD-10-CM | POA: Diagnosis not present

## 2021-10-17 DIAGNOSIS — Z6833 Body mass index (BMI) 33.0-33.9, adult: Secondary | ICD-10-CM | POA: Diagnosis not present

## 2021-10-17 DIAGNOSIS — E669 Obesity, unspecified: Secondary | ICD-10-CM | POA: Diagnosis not present

## 2021-10-18 NOTE — Progress Notes (Addendum)
Surgical Instructions    Your procedure is scheduled on Monday, July 17th, 2023.   Report to [facility] Main Entrance "A" at 05:30 A.M., then check in with the Admitting office.  Call this number if you have problems the morning of surgery:  [phone]   If you have any questions prior to your surgery date call [phone]: Open Monday-Friday 8am-4pm    Remember:  Do not eat or drink after midnight the night before your surgery    Take these medicines the morning of surgery with A SIP OF WATER:   amLODipine (NORVASC)  atorvastatin (LIPITOR)  carvedilol (COREG)  As needed:  loratadine (CLARITIN) ondansetron (ZOFRAN-ODT)   HOLD ELIQUIS 3 DAYS PRIOR TO SURGERY - last dose on Thursday, July, 13th.  Hold FARXIGA the day before surgery and the day of surgery.  As of today, STOP taking any Aspirin (unless otherwise instructed by your surgeon) Aleve, Naproxen, Ibuprofen, Motrin, Advil, Goody's, BC's, all herbal medications, fish oil, and all vitamins.    The day of surgery:          Do not wear jewelry  Do not wear lotions, powders, colognes, or deodorant. Men may shave face and neck. Do not bring valuables to the hospital.   [facility] is not responsible for any belongings or valuables. .   Do NOT Smoke (Tobacco/Vaping)  24 hours prior to your procedure  If you use a CPAP at night, you may bring your mask for your overnight stay.   Contacts, glasses, hearing aids, dentures or partials may not be worn into surgery, please bring cases for these belongings   For patients admitted to the hospital, discharge time will be determined by your treatment team.   Patients discharged the day of surgery will not be allowed to drive home, and someone needs to stay with them for 24 hours.   SURGICAL WAITING ROOM VISITATION Patients having surgery or a procedure in a hospital may have two support people. Children under the age of 42 must have an adult with them who is not the  patient. They may stay in the waiting area during the procedure and may switch out with other visitors. If the patient needs to stay at the hospital during part of their recovery, the visitor guidelines for inpatient rooms apply.  Please refer to the [facility] website for the visitor guidelines for Inpatients (after your surgery is over and you are in a regular room).    Special instructions:    Oral Hygiene is also important to reduce your risk of infection.  Remember - BRUSH YOUR TEETH THE MORNING OF SURGERY WITH YOUR REGULAR TOOTHPASTE   [facility]- Preparing For Surgery  Before surgery, you can play an important role. Because skin is not sterile, your skin needs to be as free of germs as possible. You can reduce the number of germs on your skin by washing with CHG (chlorahexidine gluconate) Soap before surgery.  CHG is an antiseptic cleaner which kills germs and bonds with the skin to continue killing germs even after washing.     Please do not use if you have an allergy to CHG or antibacterial soaps. If your skin becomes reddened/irritated stop using the CHG.  Do not shave (including legs and underarms) for at least 48 hours prior to first CHG shower. It is OK to shave your face.  Please follow these instructions carefully.     Shower the NIGHT BEFORE SURGERY and the MORNING OF SURGERY with CHG Soap.  If you chose to wash your hair, wash your hair first as usual with your normal shampoo. After you shampoo, rinse your hair and body thoroughly to remove the shampoo.  Then [employer] and genitals (private parts) with your normal soap and rinse thoroughly to remove soap.  After that Use CHG Soap as you would any other liquid soap. You can apply CHG directly to the skin and wash gently with a scrungie or a clean washcloth.   Apply the CHG Soap to your body ONLY FROM THE NECK DOWN.  Do not use on open wounds or open sores. Avoid contact with your eyes, ears, mouth and genitals (private  parts). Wash Face and genitals (private parts)  with your normal soap.   Wash thoroughly, paying special attention to the area where your surgery will be performed.  Thoroughly rinse your body with warm water from the neck down.  DO NOT shower/wash with your normal soap after using and rinsing off the CHG Soap.  Pat yourself dry with a CLEAN TOWEL.  Wear CLEAN PAJAMAS to bed the night before surgery  Place CLEAN SHEETS on your bed the night before your surgery  DO NOT SLEEP WITH PETS.   Day of Surgery:  Take a shower with CHG soap. Wear Clean/Comfortable clothing the morning of surgery Do not apply any deodorants/lotions.   Remember to brush your teeth WITH YOUR REGULAR TOOTHPASTE.    If you received a COVID test during your pre-op visit, it is requested that you wear a mask when out in public, stay away from anyone that may not be feeling well, and notify your surgeon if you develop symptoms. If you have been in contact with anyone that has tested positive in the last 10 days, please notify your surgeon.    Please read over the following fact sheets that you were given.

## 2021-10-19 ENCOUNTER — Encounter (HOSPITAL_COMMUNITY): Payer: Self-pay

## 2021-10-19 ENCOUNTER — Other Ambulatory Visit: Payer: Self-pay

## 2021-10-19 ENCOUNTER — Ambulatory Visit (HOSPITAL_COMMUNITY)
Admission: RE | Admit: 2021-10-19 | Discharge: 2021-10-19 | Disposition: A | Discharge status: Home / Self Care | Payer: Medicare HMO | Source: Ambulatory Visit | Attending: Thoracic Surgery (Cardiothoracic Vascular Surgery) | Admitting: Thoracic Surgery (Cardiothoracic Vascular Surgery)

## 2021-10-19 ENCOUNTER — Encounter (HOSPITAL_COMMUNITY)
Admission: RE | Admit: 2021-10-19 | Discharge: 2021-10-19 | Disposition: A | Discharge status: Home / Self Care | Payer: Medicare HMO | Source: Ambulatory Visit | Attending: Thoracic Surgery (Cardiothoracic Vascular Surgery) | Admitting: Thoracic Surgery (Cardiothoracic Vascular Surgery)

## 2021-10-19 VITALS — BP 135/95 | HR 62 | Temp 97.8°F | Resp 19 | Ht 73.0 in | Wt 257.4 lb

## 2021-10-19 DIAGNOSIS — I7121 Aneurysm of the ascending aorta, without rupture: Secondary | ICD-10-CM | POA: Diagnosis not present

## 2021-10-19 DIAGNOSIS — D3A090 Benign carcinoid tumor of the bronchus and lung: Secondary | ICD-10-CM

## 2021-10-19 DIAGNOSIS — R9431 Abnormal electrocardiogram [ECG] [EKG]: Secondary | ICD-10-CM | POA: Insufficient documentation

## 2021-10-19 DIAGNOSIS — C7A090 Malignant carcinoid tumor of the bronchus and lung: Secondary | ICD-10-CM | POA: Insufficient documentation

## 2021-10-19 DIAGNOSIS — E785 Hyperlipidemia, unspecified: Secondary | ICD-10-CM | POA: Diagnosis not present

## 2021-10-19 DIAGNOSIS — Z01818 Encounter for other preprocedural examination: Secondary | ICD-10-CM | POA: Diagnosis not present

## 2021-10-19 DIAGNOSIS — I10 Essential (primary) hypertension: Secondary | ICD-10-CM | POA: Insufficient documentation

## 2021-10-19 DIAGNOSIS — I672 Cerebral atherosclerosis: Secondary | ICD-10-CM | POA: Diagnosis not present

## 2021-10-19 DIAGNOSIS — E1122 Type 2 diabetes mellitus with diabetic chronic kidney disease: Secondary | ICD-10-CM | POA: Diagnosis not present

## 2021-10-19 DIAGNOSIS — R7309 Other abnormal glucose: Secondary | ICD-10-CM | POA: Diagnosis not present

## 2021-10-19 DIAGNOSIS — Z8673 Personal history of transient ischemic attack (TIA), and cerebral infarction without residual deficits: Secondary | ICD-10-CM | POA: Diagnosis not present

## 2021-10-19 DIAGNOSIS — Z20822 Contact with and (suspected) exposure to covid-19: Secondary | ICD-10-CM | POA: Diagnosis not present

## 2021-10-19 DIAGNOSIS — I4892 Unspecified atrial flutter: Secondary | ICD-10-CM | POA: Insufficient documentation

## 2021-10-19 HISTORY — DX: Type 2 diabetes mellitus without complications: E11.9

## 2021-10-19 HISTORY — DX: Cardiac arrhythmia, unspecified: I49.9

## 2021-10-19 HISTORY — DX: Pneumonia, unspecified organism: J18.9

## 2021-10-19 LAB — TYPE AND SCREEN
ABO/RH(D): O POS
Antibody Screen: NEGATIVE

## 2021-10-19 LAB — PROTIME-INR
INR: 1.5 — ABNORMAL HIGH (ref 0.8–1.2)
Prothrombin Time: 17.7 seconds — ABNORMAL HIGH (ref 11.4–15.2)

## 2021-10-19 LAB — URINALYSIS, ROUTINE W REFLEX MICROSCOPIC
Bacteria, UA: NONE SEEN
Bilirubin Urine: NEGATIVE
Glucose, UA: >=500 mg/dL — AB
Hgb urine dipstick: NEGATIVE
Ketones, ur: NEGATIVE mg/dL
Leukocytes,Ua: NEGATIVE
Nitrite: NEGATIVE
Protein, ur: NEGATIVE mg/dL
Specific Gravity, Urine: 1.025 (ref 1.005–1.030)
pH: 5 (ref 5.0–8.0)

## 2021-10-19 LAB — COMPREHENSIVE METABOLIC PANEL
ALT: 26 U/L (ref 0–44)
AST: 22 U/L (ref 15–41)
Albumin: 3.5 g/dL (ref 3.5–5.0)
Alkaline Phosphatase: 85 U/L (ref 38–126)
Anion gap: 11 (ref 5–15)
BUN: 21 mg/dL (ref 8–23)
CO2: 21 mmol/L — ABNORMAL LOW (ref 22–32)
Calcium: 8.8 mg/dL — ABNORMAL LOW (ref 8.9–10.3)
Chloride: 108 mmol/L (ref 98–111)
Creatinine, Ser: 1.36 mg/dL — ABNORMAL HIGH (ref 0.61–1.24)
GFR, Estimated: 56 mL/min — ABNORMAL LOW (ref >60)
Glucose, Bld: 221 mg/dL — ABNORMAL HIGH (ref 70–99)
Potassium: 4.7 mmol/L (ref 3.5–5.1)
Sodium: 140 mmol/L (ref 135–145)
Total Bilirubin: 1.3 mg/dL — ABNORMAL HIGH (ref 0.3–1.2)
Total Protein: 6.4 g/dL — ABNORMAL LOW (ref 6.5–8.1)

## 2021-10-19 LAB — CBC
HCT: 42.9 % (ref 39.0–52.0)
Hemoglobin: 13.7 g/dL (ref 13.0–17.0)
MCH: 31.4 pg (ref 26.0–34.0)
MCHC: 31.9 g/dL (ref 30.0–36.0)
MCV: 98.4 fL (ref 80.0–100.0)
Platelets: 173 10*3/uL (ref 150–400)
RBC: 4.36 MIL/uL (ref 4.22–5.81)
RDW: 12.9 % (ref 11.5–15.5)
WBC: 4.4 10*3/uL (ref 4.0–10.5)
nRBC: 0 % (ref 0.0–0.2)

## 2021-10-19 LAB — HEMOGLOBIN A1C
Hgb A1c MFr Bld: 7.6 % — ABNORMAL HIGH (ref 4.8–5.6)
Mean Plasma Glucose: 171.42 mg/dL

## 2021-10-19 LAB — BLOOD GAS, ARTERIAL
Acid-Base Excess: 1.5 mmol/L (ref 0.0–2.0)
Bicarbonate: 27.2 mmol/L (ref 20.0–28.0)
Drawn by: 58793
O2 Saturation: 97.4 %
Patient temperature: 37
pCO2 arterial: 46 mmHg (ref 32–48)
pH, Arterial: 7.38 (ref 7.35–7.45)
pO2, Arterial: 89 mmHg (ref 83–108)

## 2021-10-19 LAB — SARS CORONAVIRUS 2 BY RT PCR: SARS Coronavirus 2 by RT PCR: NEGATIVE

## 2021-10-19 LAB — SURGICAL PCR SCREEN

## 2021-10-19 LAB — GLUCOSE, CAPILLARY: Glucose-Capillary: 228 mg/dL — ABNORMAL HIGH (ref 70–99)

## 2021-10-19 LAB — APTT: aPTT: 37 seconds — ABNORMAL HIGH (ref 24–36)

## 2021-10-19 NOTE — Progress Notes (Signed)
Abnormal labs in PAT: UA positive for Glucose - > 500; PT 17.7; INR 1.5. Dr. Kipp Brood office was notified and also, a message was sent to Dr. Kipp Brood via [place].

## 2021-10-19 NOTE — Progress Notes (Signed)
Received a call from the lab. PCR test is invalid. Placed new order for PCR test on day of surgery.

## 2021-10-19 NOTE — Progress Notes (Signed)
PCP - Allyn Kenner, MD Cardiologist - denies  PPM/ICD - denies Device Orders - n/a Rep Notified - n/a  Chest x-ray - 10/19/2021 EKG - 10/19/2021 Stress Test - 08/29/2021 ECHO - 07/07/2021 Cardiac Cath - > 10 years ago  Sleep Study - denies CPAP - n/a  Fasting Blood Sugar - patient is not checking CBG at home CBG today - 228 A1C - done in PAT on 10/19/2021  Blood Thinner Instructions: Eliquis - last dose on 10/19/2021 per MD instruction.  Aspirin Instructions: Patient was instructed: As of today, STOP taking any Aspirin (unless otherwise instructed by your surgeon) Aleve, Naproxen, Ibuprofen, Motrin, Advil, Goody's, BC's, all herbal medications, fish oil, and all vitamins.  ERAS Protcol - n/a   COVID TEST- yes, done in PAT on 10/19/2021   Anesthesia review: yes - cardiac history  Patient denies shortness of breath, fever, cough and chest pain at PAT appointment   All instructions explained to the patient, with a verbal understanding of the material. Patient agrees to go over the instructions while at home for a better understanding. Patient also instructed to self quarantine after being tested for COVID-19. The opportunity to ask questions was provided.

## 2021-10-20 NOTE — Anesthesia Preprocedure Evaluation (Signed)
Anesthesia Evaluation  Patient identified by MRN, date of birth, ID band Patient awake    Reviewed: Allergy & Precautions, H&P , NPO status , Patient's Chart, lab work & pertinent test results  Airway Mallampati: II  TM Distance: >3 FB Neck ROM: Full    Dental no notable dental hx. (+) Poor Dentition, Dental Advisory Given   Pulmonary COPD,    Pulmonary exam normal breath sounds clear to auscultation       Cardiovascular Exercise Tolerance: Good hypertension, Pt. on medications and Pt. on home beta blockers + dysrhythmias Atrial Fibrillation  Rhythm:Regular Rate:Normal  Nuclear stress test 08/29/21: . The study is normal. The study is low risk. . No ST deviation was noted. . LV perfusion is normal. . Left ventricular function is normal. Nuclear stress EF: 63 %. The left ventricular ejection fraction is normal (55-65%). End diastolic cavity size is normal. . Prior study available for comparison from 06/23/2004. No changes compared to prior study. Low risk stress nuclear study with normal perfusion and normal left ventricular regional and global systolic function.   Echo 07/07/21: IMPRESSIONS  1. Left ventricular ejection fraction, by estimation, is 55 to 60%. The  left ventricle has normal function. The left ventricle has no regional  wall motion abnormalities. There is moderate left ventricular hypertrophy.  Left ventricular diastolic  parameters are indeterminate.  2. Right ventricular systolic function is normal. The right ventricular  size is normal.  3. Left atrial size was moderately dilated.  4. The mitral valve is normal in structure. No evidence of mitral valve  regurgitation. No evidence of mitral stenosis.  5. The aortic valve is tricuspid. There is mild calcification of the  aortic valve. There is mild thickening of the aortic valve. Aortic valve  regurgitation is not visualized. No aortic stenosis is  present.  6. Aortic dilatation noted. There is mild dilatation of the ascending  aorta, measuring 39 mm.  7. The inferior vena cava is normal in size with greater than 50%  respiratory variability, suggesting right atrial pressure of 3 mmHg.     Neuro/Psych CVA, No Residual Symptoms negative psych ROS   GI/Hepatic negative GI ROS, Neg liver ROS,   Endo/Other  diabetes, Type 2, Oral Hypoglycemic Agents  Renal/GU negative Renal ROS  negative genitourinary   Musculoskeletal   Abdominal   Peds  Hematology negative hematology ROS (+)   Anesthesia Other Findings   Reproductive/Obstetrics negative OB ROS                           Anesthesia Physical Anesthesia Plan  ASA: 3  Anesthesia Plan: General   Post-op Pain Management: Tylenol PO (pre-op)*   Induction: Intravenous  PONV Risk Score and Plan: 3 and Ondansetron, Dexamethasone and Midazolam  Airway Management Planned: Double Lumen EBT  Additional Equipment: ClearSight  Intra-op Plan:   Post-operative Plan: Extubation in OR  Informed Consent: I have reviewed the patients History and Physical, chart, labs and discussed the procedure including the risks, benefits and alternatives for the proposed anesthesia with the patient or authorized representative who has indicated his/her understanding and acceptance.     Dental advisory given  Plan Discussed with: CRNA  Anesthesia Plan Comments: (PAT note written 10/20/2021 by Myra Gianotti, PA-C. )     Anesthesia Quick Evaluation

## 2021-10-20 NOTE — Progress Notes (Signed)
Anesthesia Chart Review:  Case: [id number] Date/Time: 10/23/21 0715   Procedure: XI ROBOTIC ASSISTED THORASCOPY-LEFT UPPER LOBECTOMY (Left: Chest)   Anesthesia type: General   Pre-op diagnosis: LUL mass   [facility] ROOM 10 / [facility] OR   Surgeons: Lajuana Matte, MD       DISCUSSION: Patient is a 71 year old male scheduled for the above procedure. 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  [facility] admission 07/06/21-07/07/21. He presented to the urgent care with nausea, decreased appetite, fatigue, generalized weakness with brief syncope on 07/04/21. Heart rhythm irregular. No chest pain. EMS was contacted for triage to the [facility] ED. EKG showed rate controlled atrial flutter. CT imaging also revealed a LUL lung mass and 4.8 cm TAA. 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 He was admitted per Hospitalist Service with pulmonology and cardiology consultation. Echo showed normal LVEF, no regional wall motion abnormalities, dilated ascending aorta, no significant valvular abnormalities.  Hospitalist did review case with Neurology, and they recommended full anticoagulation as opposed to ASA given his stroke was likely due to newly diagnosed atrial flutter. Out-patient bronchoscopy planned, as well as out-patient cardiology and neurology evaluations. S/p bronchoscopy 07/24/21 by Dr. 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  Since then, he has seen cardiologist Dr. Phineas Inches, neurologist Dr. April Manson, and CT surgeon Dr. Kipp Brood. He was noted to have partial paralysis of left hemidiaphragm, likely affecting PFTs, but felt acceptable for LU lobectomy. He also had a low risk stress test on 08/29/21 and successful DCCV on 09/01/21. Per Dr. Harl Bowie, if aflutter recurs then will consider EP referral for CTI ablation. She recommended he undergo uninterrupted Eliquis for 4 weeks post DCCV prior to having lung surgery.   10/19/21 EKG showed that he was back in aflutter at 68 bpm. I notified Dr. Kipp Brood. Given patient is back in aflutter, Dr. Kipp Brood will consider bridging while Elilquis is on hold. Will defer recommendations/arrangements per surgeon. He reported instructions to last Eliquis on 10/19/21 (was also held for 2 days prior to bronchoscopy.).   Dr. Kipp Brood classified patient's Zubrod Score as 0: Normal activity, no symptoms.  10/19/21 CXR report is still in process.  PCR done at 10/19/21 PAT visit was "invalid", so will need to repeat on the day of surgery.    VS: BP (!) 135/95   Pulse 62   Temp 36.6 C   Resp 19   Ht 6\' 1"  (1.854 m)   Wt 116.8 kg   SpO2 100%   BMI 33.96 kg/m    PROVIDERS: [place] Crane, Edwinna Areola, MD is PCP  - Phineas Inches, MD is cardiologist. Established care on 08/23/21 following 06/2021 CVA/aflutter hospitalization. Next appointment is scheduled for 11/23/21.  Baltazar Apo, MD is pulmonologist - Alric Ran, MD is neurologist. Established 08/10/21 post hospitalization for CVA 06/2021. Right cerebellar stroke felt likely cardioembolic in setting of aflutter. Continue anticoagulation and follow-up with PCP, pulmonology, and cardiology. Follow-up in one year.  LABS: Preoperative labs reviewed.  (all labs ordered are listed, but only abnormal results are displayed)  Labs Reviewed  SURGICAL PCR SCREEN - Abnormal; Notable for  the following components:      Result Value   MRSA, PCR   (*)    Value: INVALID, UNABLE TO DETERMINE THE PRESENCE OF TARGET DUE TO SPECIMEN INTEGRITY. RECOLLECTION REQUESTED.   Staphylococcus aureus   (*)    Value: INVALID, UNABLE TO DETERMINE THE PRESENCE OF TARGET DUE TO SPECIMEN INTEGRITY. RECOLLECTION REQUESTED.   All other components within normal limits  GLUCOSE, CAPILLARY - Abnormal; Notable for the following components:   Glucose-Capillary 228 (*)    All other components within normal limits  COMPREHENSIVE METABOLIC PANEL - Abnormal; Notable for the following components:   CO2 21 (*)    Glucose, Bld 221 (*)    Creatinine, Ser 1.36 (*)    Calcium 8.8 (*)    Total Protein 6.4 (*)    Total Bilirubin 1.3 (*)    GFR, Estimated 56 (*)    All other components within normal limits  PROTIME-INR - Abnormal; Notable for the following components:   Prothrombin Time 17.7 (*)    INR 1.5 (*)    All other components within normal limits  APTT - Abnormal; Notable for the following components:   aPTT 37 (*)    All other components within normal limits  URINALYSIS, ROUTINE W REFLEX MICROSCOPIC - Abnormal; Notable for the following components:   Glucose, UA >=500 (*)    All other components within normal limits  HEMOGLOBIN A1C - Abnormal; Notable for the following components:   Hgb A1c MFr Bld 7.6 (*)    All other components within normal limits  SARS CORONAVIRUS 2 BY RT PCR  CBC  BLOOD GAS, ARTERIAL  TYPE AND SCREEN    PFTs 08/24/21: FVC 2.43 (56%), post 2.49 (57%). FEV1 1.87 (56%), post 1.85 (56%). DLCO unc 18.28 (64%), cor 18.49 (64%).   IMAGES: CXR 10/19/21: In process.  Sniff Test 08/23/21: IMPRESSION: Left hemidiaphragm elevation, similar to 2008. Mild-to-moderate diaphragmatic paresis.  MRA Head/Brain 07/07/21: IMPRESSION: MRA head: 1. Artifact limits evaluation of the distal M1 and proximal M2 middle cerebral arteries, bilaterally. 2. Artifact also limits evaluation of  the P2 posterior cerebral arteries, bilaterally. 3. No intracranial large vessel occlusion or proximal high-grade arterial stenosis identified. 4. Mild atherosclerotic irregularity of the intracranial internal carotid arteries. 5. Anterior and posterior circulation dolichoectasia, suggesting longstanding hypertension. MRA neck: 1. The common carotid and internal carotid arteries are patent within the neck without appreciable stenosis. 2. The vertebral arteries are codominant and patent within the neck. A portion of the distal cervical left vertebral artery is excluded from the field of view. Within this limitation, there is no appreciable cervical vertebral artery stenosis.   CT Chest 07/06/21: IMPRESSION: - Macrolobulated solid mass in LEFT upper lobe abutting the major fissure, 32 mm diameter, highly concerning for a pulmonary neoplasm; Recommend a PET/CT or tissue sampling. - Aneurysmal dilatation of ascending thoracic aorta 4.8 cm transverse; Ascending thoracic aortic aneurysm. Recommend semi-annual imaging followup by CTA or MRA and referral to cardiothoracic surgery if not already obtained. This recommendation follows 2010 ACCF/AHA/AATS/ACR/ASA/SCA/SCAI/SIR/STS/SVM Guidelines for the Diagnosis and Management of Patients With Thoracic Aortic Disease. Circulation. 2010; 121: D322-G254. Aortic aneurysm NOS (ICD10-I71.9) - Bibasilar atelectasis.     EKG: EKG 10/19/21: Atrial flutter with 4:1 A-V conduction New since previous tracing T wave abnormality, consider anterolateral ischemia Prolonged QT Abnormal ECG When compared with  ECG of 01-Sep-2021 09:03, PREVIOUS ECG IS PRESENT Confirmed by Quay Burow [phone]) on 10/19/2021 5:07:40 PM  EKG 09/01/21: Sinus rhythm with 1st degree A-V block with occasional Premature ventricular complexes Nonspecific T wave abnormality Prolonged QT Abnormal ECG When compared with ECG of 29-Aug-2021 09:45, Aflutter is no longer present. PVC  is now present Confirmed by Gwyndolyn Kaufman [phone]) on 09/01/2021 12:53:16 PM  EKG 07/06/21 12:41:25: Vent. Rate 68 bpm Atrial flutter with predominant 4:1 AV block Repol abnrm, global ischemia, diffuse leads Prolonged QT interval [QT 499, QTcB 531 ms] Confirmed by Milton Ferguson [id number]) on 07/06/2021 2:08:00 PM      CV: Nuclear stress test 08/29/21:   The study is normal. The study is low risk.   No ST deviation was noted.   LV perfusion is normal.   Left ventricular function is normal. Nuclear stress EF: 63 %. The left ventricular ejection fraction is normal (55-65%). End diastolic cavity size is normal.   Prior study available for comparison from 06/23/2004. No changes compared to prior study. Low risk stress nuclear study with normal perfusion and normal left ventricular regional and global systolic function.    Echo 07/07/21: IMPRESSIONS   1. Left ventricular ejection fraction, by estimation, is 55 to 60%. The  left ventricle has normal function. The left ventricle has no regional  wall motion abnormalities. There is moderate left ventricular hypertrophy.  Left ventricular diastolic  parameters are indeterminate.   2. Right ventricular systolic function is normal. The right ventricular  size is normal.   3. Left atrial size was moderately dilated.   4. The mitral valve is normal in structure. No evidence of mitral valve  regurgitation. No evidence of mitral stenosis.   5. The aortic valve is tricuspid. There is mild calcification of the  aortic valve. There is mild thickening of the aortic valve. Aortic valve  regurgitation is not visualized. No aortic stenosis is present.   6. Aortic dilatation noted. There is mild dilatation of the ascending  aorta, measuring 39 mm.   7. The inferior vena cava is normal in size with greater than 50%  respiratory variability, suggesting right atrial pressure of 3 mmHg.      Cardiac cath 07/13/04:  INTERPRETATION:  1.  Normal coronary  arteries.  2.  Severe contrast reaction.    Past Medical History:  Diagnosis Date   Atrial flutter ([place]) 07/06/2021   Diabetes mellitus without complication (HCC)    Dysrhythmia    HLD (hyperlipidemia)    Hypertension    Mass of upper lobe of left lung 07/2021   solid   Pneumonia    Pre-diabetes    diet controlled   Stroke ([address]) 07/06/2021   Thoracic ascending aortic aneurysm [facility])     Past Surgical History:  Procedure Laterality Date   BRONCHIAL BIOPSY  07/24/2021   Procedure: BRONCHIAL BIOPSIES;  Surgeon: Collene Gobble, MD;  Location: [facility] ENDOSCOPY;  Service: Pulmonary;;   BRONCHIAL BRUSHINGS  07/24/2021   Procedure: BRONCHIAL BRUSHINGS;  Surgeon: Collene Gobble, MD;  Location: [facility] ENDOSCOPY;  Service: Pulmonary;;   BRONCHIAL NEEDLE ASPIRATION BIOPSY  07/24/2021   Procedure: BRONCHIAL NEEDLE ASPIRATION BIOPSIES;  Surgeon: Collene Gobble, MD;  Location: [facility] ENDOSCOPY;  Service: Pulmonary;;   CARDIAC CATHETERIZATION     "Years ago"  (apporx 18-20 yrs)   CARDIOVERSION N/A 09/01/2021   Procedure: CARDIOVERSION;  Surgeon: [name], MD;  Location: [place];  Service: Cardiovascular;  Laterality: N/A;   colonoscopy  VIDEO BRONCHOSCOPY WITH RADIAL ENDOBRONCHIAL ULTRASOUND  07/24/2021   Procedure: VIDEO BRONCHOSCOPY WITH RADIAL ENDOBRONCHIAL ULTRASOUND;  Surgeon: Collene Gobble, MD;  Location: [facility] ENDOSCOPY;  Service: Pulmonary;;    MEDICATIONS:  amLODipine (NORVASC) 10 MG tablet   apixaban (ELIQUIS) 5 MG TABS tablet   atorvastatin (LIPITOR) 40 MG tablet   bisacodyl (DULCOLAX) 5 MG EC tablet   carvedilol (COREG) 25 MG tablet   FARXIGA 10 MG TABS tablet   furosemide (LASIX) 20 MG tablet   loratadine (CLARITIN) 10 MG tablet   olmesartan (BENICAR) 40 MG tablet   ondansetron (ZOFRAN-ODT) 4 MG disintegrating tablet   Vitamin D, Ergocalciferol, (DRISDOL) 1.25 MG (50000 UNIT) CAPS capsule   No current facility-administered medications for this encounter.    Myra Gianotti, PA-C Surgical Short Stay/Anesthesiology [facility] Phone [phone] [facility] Phone [phone] 10/20/2021 2:20 PM

## 2021-10-23 ENCOUNTER — Encounter (HOSPITAL_COMMUNITY): Payer: Self-pay | Admitting: Thoracic Surgery (Cardiothoracic Vascular Surgery)

## 2021-10-23 ENCOUNTER — Other Ambulatory Visit: Payer: Self-pay

## 2021-10-23 ENCOUNTER — Inpatient Hospital Stay (HOSPITAL_COMMUNITY): Payer: Medicare HMO | Admitting: Vascular Surgery

## 2021-10-23 ENCOUNTER — Inpatient Hospital Stay (HOSPITAL_COMMUNITY): Payer: Medicare HMO | Admitting: Emergency Medicine

## 2021-10-23 ENCOUNTER — Inpatient Hospital Stay (HOSPITAL_COMMUNITY): Payer: Medicare HMO

## 2021-10-23 ENCOUNTER — Encounter (HOSPITAL_COMMUNITY)
Admission: RE | Disposition: A | Discharge status: Home Health | Payer: Self-pay | Source: Ambulatory Visit | Attending: Thoracic Surgery (Cardiothoracic Vascular Surgery)

## 2021-10-23 ENCOUNTER — Inpatient Hospital Stay (HOSPITAL_COMMUNITY)
Admission: RE | Admit: 2021-10-23 | Discharge: 2021-11-03 | DRG: 163 | Disposition: A | Discharge status: Home Health | Payer: Medicare HMO | Source: Ambulatory Visit | Attending: Thoracic Surgery (Cardiothoracic Vascular Surgery) | Admitting: Thoracic Surgery (Cardiothoracic Vascular Surgery)

## 2021-10-23 DIAGNOSIS — I7121 Aneurysm of the ascending aorta, without rupture: Secondary | ICD-10-CM | POA: Diagnosis present

## 2021-10-23 DIAGNOSIS — N1831 Chronic kidney disease, stage 3a: Secondary | ICD-10-CM | POA: Diagnosis present

## 2021-10-23 DIAGNOSIS — I130 Hypertensive heart and chronic kidney disease with heart failure and stage 1 through stage 4 chronic kidney disease, or unspecified chronic kidney disease: Secondary | ICD-10-CM | POA: Diagnosis present

## 2021-10-23 DIAGNOSIS — C3412 Malignant neoplasm of upper lobe, left bronchus or lung: Secondary | ICD-10-CM | POA: Diagnosis not present

## 2021-10-23 DIAGNOSIS — I5033 Acute on chronic diastolic (congestive) heart failure: Secondary | ICD-10-CM | POA: Diagnosis not present

## 2021-10-23 DIAGNOSIS — E1165 Type 2 diabetes mellitus with hyperglycemia: Secondary | ICD-10-CM | POA: Diagnosis not present

## 2021-10-23 DIAGNOSIS — I4892 Unspecified atrial flutter: Secondary | ICD-10-CM | POA: Diagnosis not present

## 2021-10-23 DIAGNOSIS — J449 Chronic obstructive pulmonary disease, unspecified: Secondary | ICD-10-CM

## 2021-10-23 DIAGNOSIS — Z01818 Encounter for other preprocedural examination: Secondary | ICD-10-CM

## 2021-10-23 DIAGNOSIS — N170 Acute kidney failure with tubular necrosis: Secondary | ICD-10-CM | POA: Diagnosis not present

## 2021-10-23 DIAGNOSIS — I483 Typical atrial flutter: Secondary | ICD-10-CM | POA: Diagnosis not present

## 2021-10-23 DIAGNOSIS — D72829 Elevated white blood cell count, unspecified: Secondary | ICD-10-CM | POA: Diagnosis not present

## 2021-10-23 DIAGNOSIS — I480 Paroxysmal atrial fibrillation: Secondary | ICD-10-CM | POA: Diagnosis present

## 2021-10-23 DIAGNOSIS — N183 Chronic kidney disease, stage 3 unspecified: Secondary | ICD-10-CM | POA: Diagnosis not present

## 2021-10-23 DIAGNOSIS — Z800 Family history of malignant neoplasm of digestive organs: Secondary | ICD-10-CM | POA: Diagnosis not present

## 2021-10-23 DIAGNOSIS — E119 Type 2 diabetes mellitus without complications: Secondary | ICD-10-CM | POA: Diagnosis not present

## 2021-10-23 DIAGNOSIS — I10 Essential (primary) hypertension: Secondary | ICD-10-CM | POA: Diagnosis not present

## 2021-10-23 DIAGNOSIS — C7A090 Malignant carcinoid tumor of the bronchus and lung: Secondary | ICD-10-CM | POA: Diagnosis not present

## 2021-10-23 DIAGNOSIS — Z7984 Long term (current) use of oral hypoglycemic drugs: Secondary | ICD-10-CM | POA: Diagnosis not present

## 2021-10-23 DIAGNOSIS — N171 Acute kidney failure with acute cortical necrosis: Secondary | ICD-10-CM | POA: Diagnosis not present

## 2021-10-23 DIAGNOSIS — Z91041 Radiographic dye allergy status: Secondary | ICD-10-CM | POA: Diagnosis not present

## 2021-10-23 DIAGNOSIS — I959 Hypotension, unspecified: Secondary | ICD-10-CM | POA: Diagnosis not present

## 2021-10-23 DIAGNOSIS — Z8673 Personal history of transient ischemic attack (TIA), and cerebral infarction without residual deficits: Secondary | ICD-10-CM

## 2021-10-23 DIAGNOSIS — E785 Hyperlipidemia, unspecified: Secondary | ICD-10-CM | POA: Diagnosis present

## 2021-10-23 DIAGNOSIS — I4819 Other persistent atrial fibrillation: Secondary | ICD-10-CM | POA: Diagnosis not present

## 2021-10-23 DIAGNOSIS — J95812 Postprocedural air leak: Secondary | ICD-10-CM | POA: Diagnosis not present

## 2021-10-23 DIAGNOSIS — Z79899 Other long term (current) drug therapy: Secondary | ICD-10-CM

## 2021-10-23 DIAGNOSIS — D3A090 Benign carcinoid tumor of the bronchus and lung: Secondary | ICD-10-CM | POA: Diagnosis not present

## 2021-10-23 DIAGNOSIS — J9811 Atelectasis: Secondary | ICD-10-CM | POA: Diagnosis not present

## 2021-10-23 DIAGNOSIS — Z902 Acquired absence of lung [part of]: Secondary | ICD-10-CM

## 2021-10-23 DIAGNOSIS — N1832 Chronic kidney disease, stage 3b: Secondary | ICD-10-CM | POA: Diagnosis not present

## 2021-10-23 DIAGNOSIS — R0902 Hypoxemia: Secondary | ICD-10-CM | POA: Diagnosis not present

## 2021-10-23 DIAGNOSIS — J939 Pneumothorax, unspecified: Secondary | ICD-10-CM | POA: Diagnosis not present

## 2021-10-23 DIAGNOSIS — Z833 Family history of diabetes mellitus: Secondary | ICD-10-CM | POA: Diagnosis not present

## 2021-10-23 DIAGNOSIS — Z7901 Long term (current) use of anticoagulants: Secondary | ICD-10-CM | POA: Diagnosis not present

## 2021-10-23 DIAGNOSIS — Z823 Family history of stroke: Secondary | ICD-10-CM

## 2021-10-23 DIAGNOSIS — Z9889 Other specified postprocedural states: Secondary | ICD-10-CM | POA: Diagnosis not present

## 2021-10-23 HISTORY — PX: INTERCOSTAL NERVE BLOCK: SHX5021

## 2021-10-23 HISTORY — PX: NODE DISSECTION: SHX5269

## 2021-10-23 LAB — SURGICAL PCR SCREEN
MRSA, PCR: NEGATIVE
Staphylococcus aureus: NEGATIVE

## 2021-10-23 LAB — GLUCOSE, CAPILLARY
Glucose-Capillary: 169 mg/dL — ABNORMAL HIGH (ref 70–99)
Glucose-Capillary: 130 mg/dL — ABNORMAL HIGH (ref 70–99)
Glucose-Capillary: 173 mg/dL — ABNORMAL HIGH (ref 70–99)
Glucose-Capillary: 155 mg/dL — ABNORMAL HIGH (ref 70–99)
Glucose-Capillary: 161 mg/dL — ABNORMAL HIGH (ref 70–99)
Glucose-Capillary: 202 mg/dL — ABNORMAL HIGH (ref 70–99)

## 2021-10-23 LAB — ABO/RH: ABO/RH(D): O POS

## 2021-10-23 SURGERY — LOBECTOMY, LUNG, ROBOT-ASSISTED, USING VATS
Anesthesia: General | Site: Chest | Laterality: Left

## 2021-10-23 MED ORDER — ROCURONIUM BROMIDE 10 MG/ML (PF) SYRINGE
PREFILLED_SYRINGE | INTRAVENOUS | Status: DC | PRN
  Administered 2021-10-23: 70 mg via INTRAVENOUS

## 2021-10-23 MED ORDER — ATORVASTATIN CALCIUM 40 MG PO TABS
40.0000 mg | ORAL_TABLET | Freq: Every day | ORAL | Status: DC
  Administered 2021-10-24 – 2021-11-03 (×11): 40 mg via ORAL
  Filled 2021-10-23 (×11): qty 1

## 2021-10-23 MED ORDER — GLYCOPYRROLATE PF 0.2 MG/ML IJ SOSY
PREFILLED_SYRINGE | INTRAMUSCULAR | Status: DC | PRN
  Administered 2021-10-23: .2 mg via INTRAVENOUS

## 2021-10-23 MED ORDER — PROPOFOL 10 MG/ML IV BOLUS
INTRAVENOUS | Status: AC
  Filled 2021-10-23: qty 20

## 2021-10-23 MED ORDER — BUPIVACAINE HCL (PF) 0.5 % IJ SOLN
INTRAMUSCULAR | Status: AC
  Filled 2021-10-23: qty 30

## 2021-10-23 MED ORDER — SODIUM CHLORIDE FLUSH 0.9 % IV SOLN
INTRAVENOUS | Status: DC | PRN
  Administered 2021-10-23: 100 mL

## 2021-10-23 MED ORDER — BUPIVACAINE LIPOSOME 1.3 % IJ SUSP
INTRAMUSCULAR | Status: AC
  Filled 2021-10-23: qty 20

## 2021-10-23 MED ORDER — LACTATED RINGERS IV SOLN: INTRAVENOUS | Status: DC

## 2021-10-23 MED ORDER — MIDAZOLAM HCL 2 MG/2ML IJ SOLN
INTRAMUSCULAR | Status: AC
  Filled 2021-10-23: qty 2

## 2021-10-23 MED ORDER — DEXAMETHASONE SODIUM PHOSPHATE 10 MG/ML IJ SOLN
INTRAMUSCULAR | Status: AC
Start: 2021-10-23 — End: ?
  Filled 2021-10-23: qty 1

## 2021-10-23 MED ORDER — CEFAZOLIN SODIUM-DEXTROSE 2-4 GM/100ML-% IV SOLN
2.0000 g | Freq: Three times a day (TID) | INTRAVENOUS | Status: AC
  Administered 2021-10-23 (×2): 2 g via INTRAVENOUS
  Filled 2021-10-23 (×2): qty 100

## 2021-10-23 MED ORDER — ONDANSETRON HCL 4 MG/2ML IJ SOLN: 4.0000 mg | Freq: Four times a day (QID) | INTRAMUSCULAR | Status: DC | PRN

## 2021-10-23 MED ORDER — ACETAMINOPHEN 160 MG/5ML PO SOLN
1000.0000 mg | Freq: Four times a day (QID) | ORAL | Status: AC
  Administered 2021-10-26 – 2021-10-28 (×7): 1000 mg via ORAL
  Filled 2021-10-23 (×8): qty 40.6

## 2021-10-23 MED ORDER — ACETAMINOPHEN 500 MG PO TABS
1000.0000 mg | ORAL_TABLET | Freq: Once | ORAL | Status: AC
Start: 2021-10-23 — End: 2021-10-23

## 2021-10-23 MED ORDER — INSULIN ASPART 100 UNIT/ML IJ SOLN
INTRAMUSCULAR | Status: AC
  Filled 2021-10-23: qty 1

## 2021-10-23 MED ORDER — CEFAZOLIN SODIUM-DEXTROSE 2-4 GM/100ML-% IV SOLN
INTRAVENOUS | Status: AC
  Filled 2021-10-23: qty 100

## 2021-10-23 MED ORDER — INSULIN ASPART 100 UNIT/ML IJ SOLN
0.0000 [IU] | Freq: Three times a day (TID) | INTRAMUSCULAR | Status: DC
  Administered 2021-10-23: 4 [IU] via SUBCUTANEOUS
  Administered 2021-10-23: 8 [IU] via SUBCUTANEOUS
  Administered 2021-10-24 (×3): 2 [IU] via SUBCUTANEOUS
  Administered 2021-10-24: 8 [IU] via SUBCUTANEOUS
  Administered 2021-10-25 (×2): 2 [IU] via SUBCUTANEOUS
  Administered 2021-10-25: 4 [IU] via SUBCUTANEOUS
  Administered 2021-10-25: 2 [IU] via SUBCUTANEOUS
  Administered 2021-10-26: 4 [IU] via SUBCUTANEOUS
  Administered 2021-10-26: 8 [IU] via SUBCUTANEOUS
  Administered 2021-10-26: 4 [IU] via SUBCUTANEOUS
  Administered 2021-10-26 – 2021-10-27 (×3): 2 [IU] via SUBCUTANEOUS
  Administered 2021-10-27: 8 [IU] via SUBCUTANEOUS
  Administered 2021-10-27: 2 [IU] via SUBCUTANEOUS
  Administered 2021-10-28: 4 [IU] via SUBCUTANEOUS
  Administered 2021-10-28 (×2): 2 [IU] via SUBCUTANEOUS
  Administered 2021-10-28 – 2021-10-29 (×2): 8 [IU] via SUBCUTANEOUS
  Administered 2021-10-29: 4 [IU] via SUBCUTANEOUS
  Administered 2021-10-29: 2 [IU] via SUBCUTANEOUS
  Administered 2021-10-30: 8 [IU] via SUBCUTANEOUS
  Administered 2021-10-30: 4 [IU] via SUBCUTANEOUS
  Administered 2021-10-30: 2 [IU] via SUBCUTANEOUS
  Administered 2021-10-30: 4 [IU] via SUBCUTANEOUS
  Administered 2021-10-31: 8 [IU] via SUBCUTANEOUS
  Administered 2021-10-31: 4 [IU] via SUBCUTANEOUS
  Administered 2021-10-31 (×2): 8 [IU] via SUBCUTANEOUS
  Administered 2021-11-01: 4 [IU] via SUBCUTANEOUS
  Administered 2021-11-01: 2 [IU] via SUBCUTANEOUS
  Administered 2021-11-01: 8 [IU] via SUBCUTANEOUS
  Administered 2021-11-01: 4 [IU] via SUBCUTANEOUS
  Administered 2021-11-02: 2 [IU] via SUBCUTANEOUS
  Administered 2021-11-02: 4 [IU] via SUBCUTANEOUS
  Administered 2021-11-02: 2 [IU] via SUBCUTANEOUS
  Administered 2021-11-02: 8 [IU] via SUBCUTANEOUS
  Administered 2021-11-03: 2 [IU] via SUBCUTANEOUS

## 2021-10-23 MED ORDER — ENOXAPARIN SODIUM 40 MG/0.4ML IJ SOSY
40.0000 mg | PREFILLED_SYRINGE | Freq: Every day | INTRAMUSCULAR | Status: DC
  Administered 2021-10-23 – 2021-10-24 (×2): 40 mg via SUBCUTANEOUS
  Filled 2021-10-23 (×2): qty 0.4

## 2021-10-23 MED ORDER — FENTANYL CITRATE (PF) 250 MCG/5ML IJ SOLN
INTRAMUSCULAR | Status: DC | PRN
Start: 2021-10-23 — End: 2021-10-23
  Administered 2021-10-23: 100 ug via INTRAVENOUS

## 2021-10-23 MED ORDER — PANTOPRAZOLE SODIUM 40 MG IV SOLR
40.0000 mg | INTRAVENOUS | Status: DC
  Administered 2021-10-23: 40 mg via INTRAVENOUS
  Filled 2021-10-23: qty 10

## 2021-10-23 MED ORDER — TRAMADOL HCL 50 MG PO TABS
50.0000 mg | ORAL_TABLET | Freq: Four times a day (QID) | ORAL | Status: DC | PRN
  Administered 2021-10-23: 50 mg via ORAL
  Filled 2021-10-23: qty 1

## 2021-10-23 MED ORDER — FENTANYL CITRATE (PF) 250 MCG/5ML IJ SOLN
INTRAMUSCULAR | Status: AC
  Filled 2021-10-23: qty 5

## 2021-10-23 MED ORDER — IRBESARTAN 150 MG PO TABS
150.0000 mg | ORAL_TABLET | Freq: Every day | ORAL | Status: DC
  Administered 2021-10-24: 150 mg via ORAL
  Filled 2021-10-23: qty 1

## 2021-10-23 MED ORDER — INSULIN ASPART 100 UNIT/ML IJ SOLN
0.0000 [IU] | INTRAMUSCULAR | Status: AC | PRN
  Administered 2021-10-23 (×2): 2 [IU] via SUBCUTANEOUS

## 2021-10-23 MED ORDER — CARVEDILOL 25 MG PO TABS
25.0000 mg | ORAL_TABLET | Freq: Two times a day (BID) | ORAL | Status: DC
Start: 2021-10-23 — End: 2021-10-24
  Administered 2021-10-23 – 2021-10-24 (×2): 25 mg via ORAL
  Filled 2021-10-23 (×2): qty 1

## 2021-10-23 MED ORDER — PHENYLEPHRINE 80 MCG/ML (10ML) SYRINGE FOR IV PUSH (FOR BLOOD PRESSURE SUPPORT)
PREFILLED_SYRINGE | INTRAVENOUS | Status: DC | PRN
  Administered 2021-10-23: 80 ug via INTRAVENOUS

## 2021-10-23 MED ORDER — LIDOCAINE 2% (20 MG/ML) 5 ML SYRINGE
INTRAMUSCULAR | Status: DC | PRN
  Administered 2021-10-23: 60 mg via INTRAVENOUS

## 2021-10-23 MED ORDER — ACETAMINOPHEN 500 MG PO TABS
ORAL_TABLET | ORAL | Status: AC
  Administered 2021-10-23: 1000 mg via ORAL
  Filled 2021-10-23: qty 1

## 2021-10-23 MED ORDER — AMLODIPINE BESYLATE 10 MG PO TABS
10.0000 mg | ORAL_TABLET | Freq: Every day | ORAL | Status: DC
  Administered 2021-10-24 – 2021-10-25 (×2): 10 mg via ORAL
  Filled 2021-10-23 (×2): qty 1

## 2021-10-23 MED ORDER — PHENYLEPHRINE HCL-NACL 20-0.9 MG/250ML-% IV SOLN
INTRAVENOUS | Status: DC | PRN
  Administered 2021-10-23: 25 ug/min via INTRAVENOUS

## 2021-10-23 MED ORDER — ROCURONIUM BROMIDE 10 MG/ML (PF) SYRINGE
PREFILLED_SYRINGE | INTRAVENOUS | Status: AC
Start: 2021-10-23 — End: ?
  Filled 2021-10-23: qty 10

## 2021-10-23 MED ORDER — CHLORHEXIDINE GLUCONATE 0.12 % MT SOLN
OROMUCOSAL | Status: AC
  Administered 2021-10-23: 15 mL via OROMUCOSAL
  Filled 2021-10-23: qty 15

## 2021-10-23 MED ORDER — BISACODYL 5 MG PO TBEC
10.0000 mg | DELAYED_RELEASE_TABLET | Freq: Every day | ORAL | Status: DC
  Administered 2021-10-23 – 2021-11-03 (×11): 10 mg via ORAL
  Filled 2021-10-23 (×12): qty 2

## 2021-10-23 MED ORDER — ONDANSETRON HCL 4 MG/2ML IJ SOLN
INTRAMUSCULAR | Status: DC | PRN
  Administered 2021-10-23: 4 mg via INTRAVENOUS

## 2021-10-23 MED ORDER — DAPAGLIFLOZIN PROPANEDIOL 10 MG PO TABS
10.0000 mg | ORAL_TABLET | Freq: Every day | ORAL | Status: DC
  Administered 2021-10-24: 10 mg via ORAL
  Filled 2021-10-23: qty 1

## 2021-10-23 MED ORDER — BISACODYL 5 MG PO TBEC: 5.0000 mg | DELAYED_RELEASE_TABLET | Freq: Every day | ORAL | Status: DC | PRN

## 2021-10-23 MED ORDER — SUGAMMADEX SODIUM 200 MG/2ML IV SOLN
INTRAVENOUS | Status: DC | PRN
  Administered 2021-10-23: 240 mg via INTRAVENOUS

## 2021-10-23 MED ORDER — MIDAZOLAM HCL 2 MG/2ML IJ SOLN
INTRAMUSCULAR | Status: DC | PRN
  Administered 2021-10-23: 1 mg via INTRAVENOUS

## 2021-10-23 MED ORDER — HYDRALAZINE HCL 20 MG/ML IJ SOLN: 10.0000 mg | Freq: Four times a day (QID) | INTRAMUSCULAR | Status: DC | PRN

## 2021-10-23 MED ORDER — 0.9 % SODIUM CHLORIDE (POUR BTL) OPTIME
TOPICAL | Status: DC | PRN
  Administered 2021-10-23: 1000 mL

## 2021-10-23 MED ORDER — DEXAMETHASONE SODIUM PHOSPHATE 10 MG/ML IJ SOLN
INTRAMUSCULAR | Status: DC | PRN
  Administered 2021-10-23: 5 mg via INTRAVENOUS

## 2021-10-23 MED ORDER — LIDOCAINE 2% (20 MG/ML) 5 ML SYRINGE
INTRAMUSCULAR | Status: AC
Start: 2021-10-23 — End: ?
  Filled 2021-10-23: qty 5

## 2021-10-23 MED ORDER — ONDANSETRON HCL 4 MG/2ML IJ SOLN
INTRAMUSCULAR | Status: AC
  Filled 2021-10-23: qty 2

## 2021-10-23 MED ORDER — CHLORHEXIDINE GLUCONATE 0.12 % MT SOLN: 15.0000 mL | Freq: Once | OROMUCOSAL | Status: AC

## 2021-10-23 MED ORDER — HYDROMORPHONE HCL 1 MG/ML IJ SOLN: 0.2500 mg | INTRAMUSCULAR | Status: DC | PRN

## 2021-10-23 MED ORDER — SENNOSIDES-DOCUSATE SODIUM 8.6-50 MG PO TABS
1.0000 | ORAL_TABLET | Freq: Every day | ORAL | Status: DC
  Administered 2021-10-23 – 2021-11-01 (×5): 1 via ORAL
  Filled 2021-10-23 (×8): qty 1

## 2021-10-23 MED ORDER — ACETAMINOPHEN 500 MG PO TABS
1000.0000 mg | ORAL_TABLET | Freq: Four times a day (QID) | ORAL | Status: AC
  Administered 2021-10-23 – 2021-10-25 (×8): 1000 mg via ORAL
  Filled 2021-10-23 (×8): qty 2

## 2021-10-23 MED ORDER — PROPOFOL 10 MG/ML IV BOLUS
INTRAVENOUS | Status: DC | PRN
  Administered 2021-10-23: 130 mg via INTRAVENOUS

## 2021-10-23 MED ORDER — CEFAZOLIN SODIUM-DEXTROSE 2-4 GM/100ML-% IV SOLN
2.0000 g | INTRAVENOUS | Status: AC
  Administered 2021-10-23: 2 g via INTRAVENOUS

## 2021-10-23 MED ORDER — OXYCODONE HCL 5 MG PO TABS: 5.0000 mg | ORAL_TABLET | ORAL | Status: DC | PRN

## 2021-10-23 MED ORDER — MORPHINE SULFATE (PF) 2 MG/ML IV SOLN: 2.0000 mg | INTRAVENOUS | Status: DC | PRN

## 2021-10-23 MED ORDER — ORAL CARE MOUTH RINSE: 15.0000 mL | Freq: Once | OROMUCOSAL | Status: AC

## 2021-10-23 MED ORDER — EPHEDRINE SULFATE-NACL 50-0.9 MG/10ML-% IV SOSY
PREFILLED_SYRINGE | INTRAVENOUS | Status: DC | PRN
  Administered 2021-10-23 (×3): 5 mg via INTRAVENOUS

## 2021-10-23 MED ORDER — DIPHENHYDRAMINE HCL 50 MG/ML IJ SOLN
INTRAMUSCULAR | Status: DC | PRN
  Administered 2021-10-23: 25 mg via INTRAVENOUS

## 2021-10-23 MED ORDER — IPRATROPIUM-ALBUTEROL 0.5-2.5 (3) MG/3ML IN SOLN: 3.0000 mL | RESPIRATORY_TRACT | Status: DC | PRN

## 2021-10-23 SURGICAL SUPPLY — 86 items
ADH SKN CLS APL DERMABOND .7 (GAUZE/BANDAGES/DRESSINGS) ×2
APL PRP STRL LF DISP 70% ISPRP (MISCELLANEOUS) ×2
BAG SPEC RTRVL C1550 15 (MISCELLANEOUS) ×2
BLADE CLIPPER SURG (BLADE) ×4 IMPLANT
BLADE SURG 11 STRL SS (BLADE) ×4 IMPLANT
BNDG COHESIVE 6X5 TAN STRL LF (GAUZE/BANDAGES/DRESSINGS) ×4 IMPLANT
CANISTER SUCT 3000ML PPV (MISCELLANEOUS) ×8 IMPLANT
CANNULA REDUC XI 12-8 STAPL (CANNULA) ×6
CANNULA REDUCER 12-8 DVNC XI (CANNULA) ×6 IMPLANT
CATH THORACIC 28FR (CATHETERS) ×1 IMPLANT
CHLORAPREP W/TINT 26 (MISCELLANEOUS) ×4 IMPLANT
CNTNR URN SCR LID CUP LEK RST (MISCELLANEOUS) ×15 IMPLANT
CONN ST 1/4X3/8  BEN (MISCELLANEOUS) ×3
CONN ST 1/4X3/8 BEN (MISCELLANEOUS) IMPLANT
CONT SPEC 4OZ STRL OR WHT (MISCELLANEOUS) ×24
DEFOGGER SCOPE WARMER CLEARIFY (MISCELLANEOUS) ×4 IMPLANT
DERMABOND ADVANCED (GAUZE/BANDAGES/DRESSINGS) ×1
DERMABOND ADVANCED .7 DNX12 (GAUZE/BANDAGES/DRESSINGS) ×3 IMPLANT
DRAPE ARM DVNC X/XI (DISPOSABLE) ×12 IMPLANT
DRAPE COLUMN DVNC XI (DISPOSABLE) ×3 IMPLANT
DRAPE CV SPLIT W-CLR ANES SCRN (DRAPES) ×4 IMPLANT
DRAPE DA VINCI XI ARM (DISPOSABLE) ×12
DRAPE DA VINCI XI COLUMN (DISPOSABLE) ×3
DRAPE ORTHO SPLIT 77X108 STRL (DRAPES) ×3
DRAPE SURG ORHT 6 SPLT 77X108 (DRAPES) ×3 IMPLANT
ELECT REM PT RETURN 9FT ADLT (ELECTROSURGICAL) ×3
ELECTRODE REM PT RTRN 9FT ADLT (ELECTROSURGICAL) ×3 IMPLANT
GAUZE KITTNER 4X8 (MISCELLANEOUS) ×4 IMPLANT
GAUZE SPONGE 4X4 12PLY STRL (GAUZE/BANDAGES/DRESSINGS) ×4 IMPLANT
GLOVE BIO SURGEON STRL SZ7.5 (GLOVE) ×8 IMPLANT
GLOVE SURG POLYISO LF SZ8 (GLOVE) ×4 IMPLANT
GOWN STRL REUS W/ TWL LRG LVL3 (GOWN DISPOSABLE) ×6 IMPLANT
GOWN STRL REUS W/ TWL XL LVL3 (GOWN DISPOSABLE) ×9 IMPLANT
GOWN STRL REUS W/TWL 2XL LVL3 (GOWN DISPOSABLE) ×4 IMPLANT
GOWN STRL REUS W/TWL LRG LVL3 (GOWN DISPOSABLE) ×6
GOWN STRL REUS W/TWL XL LVL3 (GOWN DISPOSABLE) ×9
HEMOSTAT SURGICEL 2X14 (HEMOSTASIS) ×11 IMPLANT
KIT BASIN OR (CUSTOM PROCEDURE TRAY) ×4 IMPLANT
KIT TURNOVER KIT B (KITS) ×4 IMPLANT
NEEDLE 22X1 1/2 (OR ONLY) (NEEDLE) ×4 IMPLANT
NS IRRIG 1000ML POUR BTL (IV SOLUTION) ×12 IMPLANT
PACK CHEST (CUSTOM PROCEDURE TRAY) ×4 IMPLANT
PAD ARMBOARD 7.5X6 YLW CONV (MISCELLANEOUS) ×20 IMPLANT
PORT ACCESS TROCAR AIRSEAL 12 (TROCAR) ×3 IMPLANT
PORT ACCESS TROCAR AIRSEAL 5M (TROCAR) ×1
RELOAD STAPLE 45 2.5 WHT DVNC (STAPLE) IMPLANT
RELOAD STAPLE 45 3.5 BLU DVNC (STAPLE) IMPLANT
RELOAD STAPLE 45 4.3 GRN DVNC (STAPLE) IMPLANT
RELOAD STAPLER 2.5X45 WHT DVNC (STAPLE) ×12 IMPLANT
RELOAD STAPLER 3.5X45 BLU DVNC (STAPLE) ×6 IMPLANT
RELOAD STAPLER 4.3X45 GRN DVNC (STAPLE) ×4 IMPLANT
SEAL CANN UNIV 5-8 DVNC XI (MISCELLANEOUS) ×6 IMPLANT
SEAL XI 5MM-8MM UNIVERSAL (MISCELLANEOUS) ×6
SET TRI-LUMEN FLTR TB AIRSEAL (TUBING) ×4 IMPLANT
SOLUTION ELECTROLUBE (MISCELLANEOUS) ×1 IMPLANT
STAPLER 45 SUREFORM CVD (STAPLE) ×3
STAPLER 45 SUREFORM CVD DVNC (STAPLE) IMPLANT
STAPLER CANNULA SEAL DVNC XI (STAPLE) ×6 IMPLANT
STAPLER CANNULA SEAL XI (STAPLE) ×6
STAPLER RELOAD 2.5X45 WHITE (STAPLE) ×18
STAPLER RELOAD 2.5X45 WHT DVNC (STAPLE) ×12
STAPLER RELOAD 3.5X45 BLU DVNC (STAPLE) ×6
STAPLER RELOAD 3.5X45 BLUE (STAPLE) ×9
STAPLER RELOAD 4.3X45 GREEN (STAPLE) ×6
STAPLER RELOAD 4.3X45 GRN DVNC (STAPLE) ×4
STOPCOCK 4 WAY LG BORE MALE ST (IV SETS) ×4 IMPLANT
SUT MON AB 2-0 CT1 36 (SUTURE) ×1 IMPLANT
SUT SILK  1 MH (SUTURE) ×3
SUT SILK 1 MH (SUTURE) ×3 IMPLANT
SUT VIC AB 2-0 CT1 27 (SUTURE) ×3
SUT VIC AB 2-0 CT1 TAPERPNT 27 (SUTURE) ×3 IMPLANT
SUT VIC AB 3-0 SH 27 (SUTURE) ×6
SUT VIC AB 3-0 SH 27X BRD (SUTURE) ×6 IMPLANT
SUT VICRYL 0 TIES 12 18 (SUTURE) ×4 IMPLANT
SUT VICRYL 0 UR6 27IN ABS (SUTURE) ×8 IMPLANT
SYR 10ML LL (SYRINGE) ×4 IMPLANT
SYR 20ML LL LF (SYRINGE) ×4 IMPLANT
SYR 50ML LL SCALE MARK (SYRINGE) ×4 IMPLANT
SYSTEM RETRIEVAL ANCHOR 15 (MISCELLANEOUS) ×1 IMPLANT
SYSTEM SAHARA CHEST DRAIN ATS (WOUND CARE) ×4 IMPLANT
TAPE CLOTH 4X10 WHT NS (GAUZE/BANDAGES/DRESSINGS) ×4 IMPLANT
TAPE CLOTH SURG 4X10 WHT LF (GAUZE/BANDAGES/DRESSINGS) ×1 IMPLANT
TIP APPLICATOR SPRAY EXTEND 16 (VASCULAR PRODUCTS) IMPLANT
TOWEL GREEN STERILE (TOWEL DISPOSABLE) ×4 IMPLANT
TRAY FOLEY MTR SLVR 16FR STAT (SET/KITS/TRAYS/PACK) ×4 IMPLANT
TUBING EXTENTION W/L.L. (IV SETS) ×4 IMPLANT

## 2021-10-23 NOTE — Progress Notes (Signed)
MD Lightfoot notified of [facility] chest x-ray report- pneumothorax. No new orders.

## 2021-10-23 NOTE — Brief Op Note (Signed)
10/23/2021  10:16 AM  PATIENT:  Chris Lucas  71 y.o. male  PRE-OPERATIVE DIAGNOSIS:  LEFT UPPER LOBE mass  POST-OPERATIVE DIAGNOSIS:  LEFT UPPER LOBE mass  PROCEDURE:  XI ROBOTIC-ASSISTED THORACOSCOPY with LEFT UPPER LOBECTOMY  INTERCOSTAL NERVE BLOCK LYMPH NODE DISSECTION  SURGEON:  Lightfoot, Lucile Crater, MD - Primary  PHYSICIAN ASSISTANT: Anvitha Hutmacher  ASSISTANTS: Buddy Duty, CST, Scrub Person   ANESTHESIA:   general  EBL: 30ml  BLOOD ADMINISTERED:none  DRAINS:  58fr straight chest tube left pleural space    LOCAL MEDICATIONS USED:  Local and intercostal Exparel  SPECIMEN:  Left upper lobe lung and multiple lymph nodes  DISPOSITION OF SPECIMEN:  PATHOLOGY  COUNTS:  Correct  DICTATION: .Dragon Dictation  PLAN OF CARE: Admit to inpatient   PATIENT DISPOSITION:  PACU - hemodynamically stable.   Delay start of Pharmacological VTE agent (>24hrs) due to surgical blood loss or risk of bleeding: yes

## 2021-10-23 NOTE — Anesthesia Procedure Notes (Addendum)
Procedure Name: Intubation Date/Time: 10/23/2021 8:03 AM  Performed by: Gaylene Brooks, CRNAPre-anesthesia Checklist: Patient identified, Emergency Drugs available, Suction available and Patient being monitored Patient Re-evaluated:Patient Re-evaluated prior to induction Oxygen Delivery Method: Circle System Utilized Preoxygenation: Pre-oxygenation with 100% oxygen Induction Type: IV induction Ventilation: Mask ventilation without difficulty and Oral airway inserted - appropriate to patient size Laryngoscope Size: Mac and 4 Grade View: Grade I Tube type: Oral Endobronchial tube: Double lumen EBT Tube size (mm): 56F. Number of attempts: 1 Airway Equipment and Method: Stylet and Oral airway Placement Confirmation: ETT inserted through vocal cords under direct vision, positive ETCO2 and breath sounds checked- equal and bilateral Tube secured with: Tape Dental Injury: Teeth and Oropharynx as per pre-operative assessment  Comments: Lauren Cozart, SRNA placed DL EBT under supervision.

## 2021-10-23 NOTE — Anesthesia Postprocedure Evaluation (Signed)
Anesthesia Post Note  Patient: Chris Lucas  Procedure(s) Performed: XI ROBOTIC ASSISTED THORASCOPY-LEFT UPPER LOBECTOMY (Left: Chest) INTERCOSTAL NERVE BLOCK NODE DISSECTION     Patient location during evaluation: PACU Anesthesia Type: General Level of consciousness: awake and alert Pain management: pain level controlled Vital Signs Assessment: post-procedure vital signs reviewed and stable Respiratory status: spontaneous breathing, nonlabored ventilation, respiratory function stable and patient connected to nasal cannula oxygen Cardiovascular status: blood pressure returned to baseline and stable Postop Assessment: no apparent nausea or vomiting Anesthetic complications: no   No notable events documented.  Last Vitals:  Vitals:   10/23/21 1145 10/23/21 1200  BP: 110/85 112/84  Pulse: 60 61  Resp: 18 17  Temp:    SpO2: 97% 98%    Last Pain:  Vitals:   10/23/21 1115  TempSrc:   PainSc: 0-No pain                 Ulla Mckiernan,W. EDMOND

## 2021-10-23 NOTE — Transfer of Care (Signed)
Immediate Anesthesia Transfer of Care Note  Patient: Chris Lucas  Procedure(s) Performed: XI ROBOTIC ASSISTED THORASCOPY-LEFT UPPER LOBECTOMY (Left: Chest) INTERCOSTAL NERVE BLOCK NODE DISSECTION  Patient Location: PACU  Anesthesia Type:General  Level of Consciousness: awake, drowsy and patient cooperative  Airway & Oxygen Therapy: Patient Spontanous Breathing and Patient connected to face mask oxygen  Post-op Assessment: Report given to RN, Post -op Vital signs reviewed and stable and Patient moving all extremities X 4  Post vital signs: Reviewed and stable  Last Vitals:  Vitals Value Taken Time  BP 116/81 10/23/21 1045  Temp    Pulse 59 10/23/21 1049  Resp 9 10/23/21 1049  SpO2 95 % 10/23/21 1049  Vitals shown include unvalidated device data.  Last Pain:  Vitals:   10/23/21 0614  TempSrc:   PainSc: 0-No pain         Complications: No notable events documented.

## 2021-10-23 NOTE — Op Note (Signed)
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 The assistant was needed for exposure, dissection, suctioning, retraction of delicate tissues and sutures, instrument exchange and for overall help during this procedure.    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  We reviewed the cross-sectional imaging the mass is quite central thus could have a lobectomy.  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  There is no indication for surgical repair at this point.     In regards to the carcinoid tumor, his pulmonary function shows decreased numbers, but acceptable.  This is likely due to his chronically elevated left hemidiaphragm.  His sniff testing did show some motion, which indicates that his diaphragm is not paralyzed.  We discussed the risks and benefits of left robotic assisted thoracoscopy with left upper lobectomy.  He is agreeable to proceed.  We will assess his symptoms postoperatively to determine if anything needs to be done to his left hemidiaphragm in the future.  He will also need to be off of Eliquis for 3 days prior to  surgery.   Findings: Normal anatomy, normal appearing lymph nodes  Operative Technique: After the risks, benefits and alternatives were thoroughly discussed, the patient was brought to the operative theatre.  Anesthesia was induced, and the patient was then placed in a right lateral decubitus position and was prepped and draped in normal sterile fashion.  An appropriate surgical pause was performed, and pre-operative antibiotics were dosed accordingly.  We began by placing our 4 robotic ports in the the 7th intercostal space targeting the hilum of the lung.  A 5mm assistant port was placed in the 9th intercostal space in the anterior axillary line.  The robot was then docked and all instruments were passed under direct visualization.    The lung was then retracted superiorly, and the inferior pulmonary ligament was divided.  The hilum was mobilized anteriorly and posteriorly.  We identified the upper lobe vein, and after careful isolation, it was divided with a vascular stapler.  We next moved to the  pulmonary artery.  The artery was then divided with a vascular load stapler.  The bronchus to the upper was then isolated.  After a test clamp, with good ventilation of the lower lobw, the bronchus was then divided.  The fissure was completed, and the specimen was passed into an endocatch bag.  It was removed from the anterior access site.    Lymph nodes were then sampled at levels 5,6,7,9 and hilum.  The  chest was irrigated, and an air leak test was performed.  An intercostal nerve block was performed under direct visualization.  A 28 F chest tube was then placed, and we watch the remaining lobes re-expand.  The skin and soft tissue were closed with absorbable suture    The patient tolerated the procedure without any immediate complications, and was transferred to the PACU in stable condition.  [name] [name]

## 2021-10-23 NOTE — Interval H&P Note (Signed)
History and Physical Interval Note:  10/23/2021 7:38 AM  Chris Lucas  has presented today for surgery, with the diagnosis of LEFT UPPER LOBE mass.  The various methods of treatment have been discussed with the patient and family. After consideration of risks, benefits and other options for treatment, the patient has consented to  Procedure(s): XI ROBOTIC ASSISTED THORASCOPY-LEFT UPPER LOBECTOMY (Left) as a surgical intervention.  The patient's history has been reviewed, patient examined, no change in status, stable for surgery.  I have reviewed the patient's chart and labs.  Questions were answered to the patient's satisfaction.     [name]

## 2021-10-23 NOTE — Hospital Course (Signed)
Referring: [name], MD Primary Care: [name], MD    History of Present Illness:    Chris Lucas 71 y.o. male presents for surgical evaluation of a left upper lobe carcinoid tumor, ascending aortic aneurysm, and chronically elevated left hemidiaphragm.  He has previously been seen by Dr. 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  He denies any respiratory symptoms.  He is a lifelong non-smoker, and remains very active with weightlifting and exercise.   This nodule was originally found incidentally when he presented with a stroke to [facility].  He was then noted to have a 3.5 cm left upper lobe macrolobulated mass.  During this hospitalization he was also noted to be in atrial fibrillation and was placed on Eliquis.   In addition, to the carcinoid tumor, he was also found to have a 4.8 cm ascending aortic aneurysm.  We reviewed the cross-sectional imaging.  The mass is quite central thus he could have a lobectomy.  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  There is no indication for surgical repair at this point.     In regards to the carcinoid tumor, his pulmonary function shows decreased numbers, but acceptable.  This is likely due to his chronically elevated left hemidiaphragm.  His sniff testing did show some motion, which indicates that his diaphragm is not paralyzed.  We discussed the risks and benefits of left robotic assisted thoracoscopy with left upper lobectomy.  He is agreeable to proceed.  We will assess his symptoms postoperatively to determine if anything needs to be done to his left hemidiaphragm in the future.  He will also need to be off of Eliquis for 3 days prior to surgery. He denies any shortness of breath, or coughs.  He denies any chest pain.  He has had some lower extremity swelling recently and was placed on Lasix.  Hospital  Course: Chris Lucas was admitted for elective surgery on 10/23/2021.  He was taken to the operating room where robotic assisted left upper lobectomy was carried out as planned.  There were no operative complications.  Following the procedure, he was transferred to the postanesthesia care unit after being extubated in the OR.  He was later transferred to [facility].Marland Kitchen

## 2021-10-23 NOTE — Plan of Care (Signed)
?  Problem: Clinical Measurements: ?Goal: Diagnostic test results will improve ?Outcome: Progressing ?Goal: Respiratory complications will improve ?Outcome: Progressing ?Goal: Cardiovascular complication will be avoided ?Outcome: Progressing ?  ?Problem: Coping: ?Goal: Level of anxiety will decrease ?Outcome: Progressing ?  ?Problem: Elimination: ?Goal: Will not experience complications related to urinary retention ?Outcome: Progressing ?  ?Problem: Pain Managment: ?Goal: General experience of comfort will improve ?Outcome: Progressing ?  ?Problem: Safety: ?Goal: Ability to remain free from injury will improve ?Outcome: Progressing ?  ?

## 2021-10-24 ENCOUNTER — Inpatient Hospital Stay (HOSPITAL_COMMUNITY): Payer: Medicare HMO

## 2021-10-24 ENCOUNTER — Encounter (HOSPITAL_COMMUNITY): Payer: Self-pay | Admitting: Thoracic Surgery (Cardiothoracic Vascular Surgery)

## 2021-10-24 LAB — GLUCOSE, CAPILLARY
Glucose-Capillary: 158 mg/dL — ABNORMAL HIGH (ref 70–99)
Glucose-Capillary: 219 mg/dL — ABNORMAL HIGH (ref 70–99)
Glucose-Capillary: 145 mg/dL — ABNORMAL HIGH (ref 70–99)
Glucose-Capillary: 150 mg/dL — ABNORMAL HIGH (ref 70–99)

## 2021-10-24 LAB — BASIC METABOLIC PANEL
Anion gap: 10 (ref 5–15)
BUN: 26 mg/dL — ABNORMAL HIGH (ref 8–23)
CO2: 23 mmol/L (ref 22–32)
Calcium: 8.6 mg/dL — ABNORMAL LOW (ref 8.9–10.3)
Chloride: 104 mmol/L (ref 98–111)
Creatinine, Ser: 1.58 mg/dL — ABNORMAL HIGH (ref 0.61–1.24)
GFR, Estimated: 47 mL/min — ABNORMAL LOW (ref >60)
Glucose, Bld: 212 mg/dL — ABNORMAL HIGH (ref 70–99)
Potassium: 5.2 mmol/L — ABNORMAL HIGH (ref 3.5–5.1)
Sodium: 137 mmol/L (ref 135–145)

## 2021-10-24 LAB — SURGICAL PATHOLOGY

## 2021-10-24 MED ORDER — PANTOPRAZOLE SODIUM 40 MG PO TBEC
40.0000 mg | DELAYED_RELEASE_TABLET | Freq: Every day | ORAL | Status: DC
  Administered 2021-10-24 – 2021-11-03 (×11): 40 mg via ORAL
  Filled 2021-10-24 (×11): qty 1

## 2021-10-24 MED ORDER — SODIUM CHLORIDE 0.9 % IV BOLUS
250.0000 mL | Freq: Once | INTRAVENOUS | Status: AC
Start: 2021-10-24 — End: 2021-10-24
  Administered 2021-10-24: 250 mL via INTRAVENOUS

## 2021-10-24 MED ORDER — DOCUSATE SODIUM 100 MG PO CAPS: 100.0000 mg | ORAL_CAPSULE | Freq: Two times a day (BID) | ORAL | Status: DC | PRN

## 2021-10-24 MED ORDER — POLYETHYLENE GLYCOL 3350 17 G PO PACK
17.0000 g | PACK | Freq: Every day | ORAL | Status: DC | PRN
  Administered 2021-10-26: 17 g via ORAL
  Filled 2021-10-24: qty 1

## 2021-10-24 MED ORDER — APIXABAN 5 MG PO TABS
5.0000 mg | ORAL_TABLET | Freq: Two times a day (BID) | ORAL | Status: DC
  Administered 2021-10-24 – 2021-11-03 (×20): 5 mg via ORAL
  Filled 2021-10-24 (×20): qty 1

## 2021-10-24 MED ORDER — ORAL CARE MOUTH RINSE: 15.0000 mL | OROMUCOSAL | Status: DC | PRN

## 2021-10-24 MED ORDER — CARVEDILOL 12.5 MG PO TABS
12.5000 mg | ORAL_TABLET | Freq: Two times a day (BID) | ORAL | Status: DC
  Administered 2021-10-24: 12.5 mg via ORAL
  Filled 2021-10-24: qty 1

## 2021-10-24 MED ORDER — SODIUM CHLORIDE 0.9 % IV BOLUS
500.0000 mL | Freq: Once | INTRAVENOUS | Status: AC
Start: 2021-10-24 — End: 2021-10-24
  Administered 2021-10-24: 500 mL via INTRAVENOUS

## 2021-10-24 NOTE — Progress Notes (Signed)
Pt bradycardic w/ HR in low 40s. RN paged PA who called back immediately. PA will adjust medication parameters. RN will continue to monitor pt.  Pt's BP 69/51. Patient responsive but said he felt a little "woozy" after attempt to have bm and ambulating  from bathroom. . Rapid response called and came to bedside. RN paged PA again, who called immediately and gave verbal order to stat 500 NS bolus, which was started immediately.   BP 80/57 (65) at 1315. PA at bedside, ordered 250 bolus. RN continuing to monitor  1633 Pt's BP 138/98 and HR in 60s. Pt states he "feels better." RN continuing to monitor

## 2021-10-24 NOTE — Progress Notes (Addendum)
      [address]       [place] [address]             [phone]      1 Day Post-Op Procedure(s) (LRB): XI ROBOTIC ASSISTED THORASCOPY-LEFT UPPER LOBECTOMY (Left) INTERCOSTAL NERVE BLOCK NODE DISSECTION Subjective: Sitting up eating breakfast. Says pain is controlled.   Objective: Vital signs in last 24 hours: Temp:  [97.2 F (36.2 C)-98 F (36.7 C)] 97.6 F (36.4 C) (07/18 0427) Pulse Rate:  [59-61] 61 (07/18 0427) Cardiac Rhythm: Atrial flutter (07/18 0715) Resp:  [9-23] 20 (07/18 0427) BP: (92-123)/(61-87) 92/61 (07/18 0427) SpO2:  [92 %-100 %] 97 % (07/18 0452)  Hemodynamic parameters for last 24 hours:    Intake/Output from previous day: 07/17 0701 - 07/18 0700 In: 2260 [P.O.:960; I.V.:1200; IV Piggyback:100] Out: 1425 [Urine:855; Blood:150; Chest Tube:420] Intake/Output this shift: Total I/O In: -  Out: 130 [Chest Tube:130]  General appearance: alert, cooperative, and no distress Neurologic: intact Heart: A-flutter with controlled VR early post-op, currently SR in the 60's Lungs: breath sounds shallow on the left, full and clear on the right. No air leak CT drainage 250ml past 12 hours.  Abdomen: firm, non-tender Extremities: mild LE edema Wound: the port incisions are intact and dry, had some bloody drainage from around the pleural tube over night.   Lab Results: No results for input(s): "WBC", "HGB", "HCT", "PLT" in the last 72 hours. BMET:  Recent Labs    10/24/21 0101  NA 137  K 5.2*  CL 104  CO2 23  GLUCOSE 212*  BUN 26*  CREATININE 1.58*  CALCIUM 8.6*    PT/INR: No results for input(s): "LABPROT", "INR" in the last 72 hours. ABG    Component Value Date/Time   PHART 7.38 10/19/2021 1400   HCO3 27.2 10/19/2021 1400   O2SAT 97.4 10/19/2021 1400   CBG (last 3)  Recent Labs    10/23/21 1615 10/23/21 2048 10/24/21 0613  GLUCAP 161* 202* 158*    Assessment/Plan: S/P Procedure(s) (LRB): XI ROBOTIC ASSISTED  THORASCOPY-LEFT UPPER LOBECTOMY (Left) INTERCOSTAL NERVE BLOCK NODE DISSECTION  -POD1 robotic-assisted left upper lobectomy for 4.8cm carcinoid tumor. No air leak. Wil d/c the chest tube today, mobilize, wean O2.   -H/O PAF- currently in SR, will  resume his Eliquis today.   -Hyperglycemia- pt not aware of being diagnosed with DM. Recent HA1C was 7.6 and he was recently started on Farxiga by his PCP. Continue CBG's and SSI. Resume Farxiga.  -CKD- creat near baseline of ~1.5. Monitor.   -DVT PPX- Has SCD's in place, to start SQ enoxaparin today  -Thoracic aortic aneurysm- 4.8cm incidentally found on workup for lung mass. Echo in march shows tri-leaflet aortic valve and no AS. Will need surveillance with repeat imaging in 6 months.     LOS: 1 day    Antony Odea, [place] [phone] 10/24/2021  Agree with above No leak on CT IS/ ambulation Will remove CT today  Kennette Cuthrell O Kristyna Bradstreet

## 2021-10-24 NOTE — Significant Event (Signed)
Rapid Response Event Note   Reason for Call :  Hypotension 69/51  Initial Focused Assessment:  Pt lying in bed, drowsy, oriented. He endorses having been up to the bathroom trying to have a bowel movement. When he returned to bed, RN noted him to appear more fatigued.   Abdomen is round, soft. Pt denies discomfort other than the need to have a bowel movement.   HR 40s, atrial flutter.   No acute findings regarding chest tube. Plan is to discontinue chest tube today.   VS: BP 69/51, HR 43, RR 21, SpO2 88% on 4LNC  Interventions:  -500 cc IVF bolus per PA  Plan of Care:  -q15 min VS until SBP >90, MAP >65 - unless otherwise specified by MD -Notify if BP does not improve with IVF bolus -Close monitoring of pt mentation -Bowel regimen, per MD  Call rapid response for additional needs  Event Summary:  MD Notified: Evonnie Pat., PA Call Time: [place] Time: 8527 End Time: [place], RN

## 2021-10-25 ENCOUNTER — Encounter (HOSPITAL_COMMUNITY): Payer: Self-pay | Admitting: Thoracic Surgery (Cardiothoracic Vascular Surgery)

## 2021-10-25 ENCOUNTER — Inpatient Hospital Stay (HOSPITAL_COMMUNITY): Payer: Medicare HMO

## 2021-10-25 DIAGNOSIS — I483 Typical atrial flutter: Secondary | ICD-10-CM

## 2021-10-25 LAB — COMPREHENSIVE METABOLIC PANEL
ALT: 18 U/L (ref 0–44)
AST: 24 U/L (ref 15–41)
Albumin: 3.5 g/dL (ref 3.5–5.0)
Alkaline Phosphatase: 80 U/L (ref 38–126)
Anion gap: 12 (ref 5–15)
BUN: 31 mg/dL — ABNORMAL HIGH (ref 8–23)
CO2: 24 mmol/L (ref 22–32)
Calcium: 8.9 mg/dL (ref 8.9–10.3)
Chloride: 102 mmol/L (ref 98–111)
Creatinine, Ser: 1.94 mg/dL — ABNORMAL HIGH (ref 0.61–1.24)
GFR, Estimated: 37 mL/min — ABNORMAL LOW (ref >60)
Glucose, Bld: 129 mg/dL — ABNORMAL HIGH (ref 70–99)
Potassium: 5.1 mmol/L (ref 3.5–5.1)
Sodium: 138 mmol/L (ref 135–145)
Total Bilirubin: 1 mg/dL (ref 0.3–1.2)
Total Protein: 7 g/dL (ref 6.5–8.1)

## 2021-10-25 LAB — CBC
HCT: 45 % (ref 39.0–52.0)
Hemoglobin: 13.7 g/dL (ref 13.0–17.0)
MCH: 30.7 pg (ref 26.0–34.0)
MCHC: 30.4 g/dL (ref 30.0–36.0)
MCV: 100.9 fL — ABNORMAL HIGH (ref 80.0–100.0)
Platelets: 173 10*3/uL (ref 150–400)
RBC: 4.46 MIL/uL (ref 4.22–5.81)
RDW: 13.2 % (ref 11.5–15.5)
WBC: 12.8 10*3/uL — ABNORMAL HIGH (ref 4.0–10.5)
nRBC: 0 % (ref 0.0–0.2)

## 2021-10-25 LAB — GLUCOSE, CAPILLARY
Glucose-Capillary: 155 mg/dL — ABNORMAL HIGH (ref 70–99)
Glucose-Capillary: 153 mg/dL — ABNORMAL HIGH (ref 70–99)
Glucose-Capillary: 146 mg/dL — ABNORMAL HIGH (ref 70–99)
Glucose-Capillary: 168 mg/dL — ABNORMAL HIGH (ref 70–99)

## 2021-10-25 MED ORDER — CARVEDILOL 6.25 MG PO TABS
6.2500 mg | ORAL_TABLET | Freq: Two times a day (BID) | ORAL | Status: DC
  Administered 2021-10-25 – 2021-11-03 (×19): 6.25 mg via ORAL
  Filled 2021-10-25 (×19): qty 1

## 2021-10-25 NOTE — Progress Notes (Signed)
Mobility Specialist Progress Note    10/25/21 1442  Mobility  Activity Ambulated with assistance in hallway  Level of Assistance +2 (takes two people) (chair follow)  Assistive Device Front wheel walker  Distance Ambulated (ft) 300 ft (150+150)  Activity Response Tolerated well  $Mobility charge 1 Mobility   Pre-Mobility: 69 HR, 148/92 BP Post-Mobility: 67 HR, 94% SpO2  Pt received sitting EOB and agreeable. C/o feeling congested in chest. ModA +1  to stand. Took x1 seated rest break. Pt left in chair with call bell in reach on 4LO2 in room. RN notified.   Hildred Alamin Mobility Specialist

## 2021-10-25 NOTE — Progress Notes (Addendum)
[address]       [place] [address]             [phone]      2 Days Post-Op Procedure(s) (LRB): XI ROBOTIC ASSISTED THORASCOPY-LEFT UPPER LOBECTOMY (Left) INTERCOSTAL NERVE BLOCK NODE DISSECTION Subjective: Feels fair  Objective: Vital signs in last 24 hours: Temp:  [97.3 F (36.3 C)-98.8 F (37.1 C)] 98.8 F (37.1 C) (07/19 0303) Pulse Rate:  [41-66] 66 (07/19 0400) Cardiac Rhythm: Atrial flutter (07/19 0400) Resp:  [18-23] 19 (07/19 0400) BP: (69-138)/(50-98) 132/78 (07/19 0303) SpO2:  [86 %-100 %] 97 % (07/19 0400)  Hemodynamic parameters for last 24 hours:    Intake/Output from previous day: 07/18 0701 - 07/19 0700 In: -  Out: 910 [Urine:700; Chest Tube:210] Intake/Output this shift: No intake/output data recorded.  General appearance: alert, cooperative, and no distress Heart: irregularly irregular rhythm Lungs: dim left base Abdomen: benign Extremities: marked BLE edema Wound: incis healing well  Lab Results: Recent Labs    10/25/21 0042  WBC 12.8*  HGB 13.7  HCT 45.0  PLT 173   BMET:  Recent Labs    10/24/21 0101 10/25/21 0042  NA 137 138  K 5.2* 5.1  CL 104 102  CO2 23 24  GLUCOSE 212* 129*  BUN 26* 31*  CREATININE 1.58* 1.94*  CALCIUM 8.6* 8.9    PT/INR: No results for input(s): "LABPROT", "INR" in the last 72 hours. ABG    Component Value Date/Time   PHART 7.38 10/19/2021 1400   HCO3 27.2 10/19/2021 1400   O2SAT 97.4 10/19/2021 1400   CBG (last 3)  Recent Labs    10/24/21 1631 10/24/21 2117 10/25/21 0631  GLUCAP 145* 150* 155*    Meds Scheduled Meds:  acetaminophen  1,000 mg Oral Q6H   Or   acetaminophen (TYLENOL) oral liquid 160 mg/5 mL  1,000 mg Oral Q6H   amLODipine  10 mg Oral Daily   apixaban  5 mg Oral BID   atorvastatin  40 mg Oral Daily   bisacodyl  10 mg Oral Daily   carvedilol  12.5 mg Oral BID   dapagliflozin propanediol  10 mg Oral Daily   insulin aspart  0-24 Units  Subcutaneous TID AC & HS   pantoprazole  40 mg Oral Daily   senna-docusate  1 tablet Oral QHS   Continuous Infusions: PRN Meds:.bisacodyl, docusate sodium, hydrALAZINE, ipratropium-albuterol, morphine injection, ondansetron (ZOFRAN) IV, mouth rinse, oxyCODONE, polyethylene glycol, traMADol  Xrays DG Chest Port 1 View  Result Date: 10/24/2021 CLINICAL DATA:  Postop day 1 status post left upper lobectomy and left video thoracoscopy for left upper lobe carcinoid tumor. EXAM: PORTABLE CHEST 1 VIEW COMPARISON:  10/23/2021 FINDINGS: Suspected small persistent left apical pneumothorax adjacent to the margin of the chest tube, around 5% of left hemithoracic volume. Left upper lobectomy with volume loss in the left hemithorax. Progressive left perihilar density probably from atelectasis although technically nonspecific. Mild interstitial accentuation in the right lung is present but has improved in the right upper lobe. Low lung volumes are also present on the right side. Moderately widened mediastinum. Lower thoracic spondylosis. IMPRESSION: 1. Small persistent left apical pneumothorax. Left chest tube in place. 2. Increased left perihilar airspace opacity, probably atelectasis given the degree of volume loss, although technically nonspecific. 3. Overall very low lung volumes. 4. Mild improvement in the bandlike opacities and interstitial opacity in the right upper lobe. 5. Continued widened mediastinum. Electronically Signed  By: Van Clines M.D.   On: 10/24/2021 08:21   DG Chest Port 1 View  Result Date: 10/23/2021 CLINICAL DATA:  Postop day 0 status post left upper lobectomy and left video thoracoscopy for left upper lobe carcinoid tumor. EXAM: PORTABLE CHEST 1 VIEW COMPARISON:  10/19/2021 FINDINGS: Interval left upper lobectomy, with left chest tube in place. There is a 10% left apical pneumothorax. Low lung volumes are present, causing crowding of the pulmonary vasculature. Indistinct right upper  lobe airspace opacity, probably from atelectasis given rightward shift of mediastinal structures thoracic spondylosis. Indistinctness of the left upper mediastinum and aortic arch, probably from mediastinal edema or blood products. IMPRESSION: 1. Status post left upper lobectomy, with 10% left apical pneumothorax. A left chest tube is in place. 2. Thickening of the upper mediastinum with indistinctness of the aortic arch, probably from mediastinal edema or blood products. 3. Indistinct airspace opacities in the right upper lobe, likely from atelectasis given the rightward mediastinal shift. 4. Low lung volumes are present, causing crowding of the pulmonary vasculature. Electronically Signed   By: Van Clines M.D.   On: 10/23/2021 11:31    Assessment/Plan: S/P Procedure(s) (LRB): XI ROBOTIC ASSISTED THORASCOPY-LEFT UPPER LOBECTOMY (Left) INTERCOSTAL NERVE BLOCK NODE DISSECTION POD#2  1 afeb, Pulse 40's- 60's , s BP 69- 130's- good overnight,  aflutter, chronic , on coreg/eliquis, BP meds also include norvasc and benicar, will reduce coreg dose w/parameters. Significant edema- will ask cardiology to assist with management 2 sats good on 5 liters 3 voiding- amounts not all recorded 4 creat and BUN rising - will hold farxiga for now, cont SSI, may need other agent with renal insuff- will ask DM coordinator to see 5 leukocytosis, WBC 12.8 - monitor clinically, prob reactive 6 not anemic 7 CXR large gastric bubble v intestinal dilation, chronic but worse, small apical /lat pntx<10% 8 push rehab and pulm hygiene    LOS: 2 days    John Giovanni PA-C 10/25/2021

## 2021-10-25 NOTE — Consult Note (Signed)
Cardiology Consultation:   Patient ID: Chris Lucas MRN: [id number]; DOB: 23-Jul-1950  Admit date: 10/23/2021 Date of Consult: 10/25/2021  PCP:  [name], MD   [facility] Providers Cardiologist:  Dr Phineas Inches    Patient Profile:   Chris Lucas is a 71 y.o. male with a hx of atrial flutter, hypertension, chronic kidney disease, thoracic aortic aneurysm, carcinoid tumor, diabetes mellitus, hyperlipidemia, prior CVA who is being seen 10/25/2021 for the evaluation of atrial flutter and lower ext edema at the request of Melodie Bouillon, MD.  History of Present Illness:   Chest CT March 2023 showed left upper lobe mass and thoracic aortic aneurysm at 4.8 cm.  Echocardiogram March 2023 showed normal LV function, moderate left ventricular hypertrophy, moderate left atrial enlargement.  Nuclear study May 2023 showed ejection fraction 63% with normal perfusion.  Patient was diagnosed with atrial flutter during admission March 2023.  He was also found to have a lung mass and biopsy positive for carcinoid.  Patient underwent successful cardioversion on Sep 01, 2021.  He was admitted for elective resection of his carcinoid tumor which occurred on July 17.  Postoperatively he has developed increasing edema and also recurrent atrial flutter.  Also note yesterday patient developed hypotension after having a bowel movement.  Heart rate 40s by report and blood pressure 69/51.  Carvedilol was decreased and patient was given a 500 cc bolus of IV fluid.  Cardiology now asked to evaluate.  Patient states that he has chronic pedal edema.  This had worsen 2 weeks prior to surgery.  He typically does not have exertional chest pain or dyspnea on exertion.  No palpitations or syncope.   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   solid   Pneumonia    Pre-diabetes    diet controlled   Stroke ([place]) 07/06/2021   Thoracic ascending aortic aneurysm [facility])     Past Surgical History:  Procedure Laterality Date   BRONCHIAL BIOPSY  07/24/2021   Procedure: BRONCHIAL BIOPSIES;  Surgeon: Collene Gobble, MD;  Location: [facility] ENDOSCOPY;  Service: Pulmonary;;   BRONCHIAL BRUSHINGS  07/24/2021   Procedure: BRONCHIAL BRUSHINGS;  Surgeon: Collene Gobble, MD;  Location: [facility] ENDOSCOPY;  Service: Pulmonary;;   BRONCHIAL NEEDLE ASPIRATION BIOPSY  07/24/2021   Procedure: BRONCHIAL NEEDLE ASPIRATION BIOPSIES;  Surgeon: Collene Gobble, MD;  Location: [place] ENDOSCOPY;  Service: Pulmonary;;   CARDIAC CATHETERIZATION     "Years ago"  (apporx 18-20 yrs)   CARDIOVERSION N/A 09/01/2021   Procedure: CARDIOVERSION;  Surgeon: [name], MD;  Location: [place];  Service: Cardiovascular;  Laterality: N/A;   colonoscopy     INTERCOSTAL NERVE BLOCK  10/23/2021   Procedure: INTERCOSTAL NERVE BLOCK;  Surgeon: Lajuana Matte, MD;  Location: [place];  Service: Thoracic;;   NODE DISSECTION  10/23/2021   Procedure: NODE DISSECTION;  Surgeon: Lajuana Matte, MD;  Location: [place];  Service: Thoracic;;   VIDEO BRONCHOSCOPY WITH RADIAL ENDOBRONCHIAL ULTRASOUND  07/24/2021   Procedure: VIDEO BRONCHOSCOPY WITH RADIAL ENDOBRONCHIAL ULTRASOUND;  Surgeon: Collene Gobble, MD;  Location: [facility] ENDOSCOPY;  Service: Pulmonary;;     Inpatient Medications: Scheduled Meds:  acetaminophen  1,000 mg Oral Q6H   Or   acetaminophen (TYLENOL) oral liquid 160 mg/5 mL  1,000 mg Oral Q6H   amLODipine  10 mg Oral Daily   apixaban  5 mg Oral BID   atorvastatin  40 mg Oral Daily   bisacodyl  10 mg Oral Daily   carvedilol  6.25 mg Oral BID   insulin aspart  0-24 Units Subcutaneous TID AC & HS   pantoprazole  40 mg Oral Daily   senna-docusate  1 tablet Oral QHS   Continuous Infusions:  PRN Meds: bisacodyl, docusate sodium, hydrALAZINE,  ipratropium-albuterol, morphine injection, ondansetron (ZOFRAN) IV, mouth rinse, oxyCODONE, polyethylene glycol, traMADol  Allergies:    Allergies  Allergen Reactions   Ivp Dye [Iodinated Contrast Media] Shortness Of Breath and Rash    Social History:   Social History   Socioeconomic History   Marital status: Married    Spouse name: Not on file   Number of children: Not on file   Years of education: Not on file   Highest education level: Not on file  Occupational History   Not on file  Tobacco Use   Smoking status: Never    Passive exposure: Never   Smokeless tobacco: Never  Vaping Use   Vaping Use: Never used  Substance and Sexual Activity   Alcohol use: Not Currently   Drug use: Not Currently   Sexual activity: Not Currently  Other Topics Concern   Not on file  Social History Narrative   Not on file   Social Determinants of Health   Financial Resource Strain: Not on file  Food Insecurity: Not on file  Transportation Needs: Not on file  Physical Activity: Not on file  Stress: Not on file  Social Connections: Not on file  Intimate Partner Violence: Not on file    Family History:    Family History  Problem Relation Age of Onset   Diabetes Mother    Stroke Father    Diabetes Sister    Aneurysm Sister    Pancreatic cancer Sister      ROS:  Please see the history of present illness.  Pain at incision site. All other ROS reviewed and negative.     Physical Exam/Data:   Vitals:   10/25/21 0303 10/25/21 0400 10/25/21 1030 10/25/21 1124  BP: 132/78  124/81 138/82  Pulse: 66 66 69 68  Resp: (!) 23 19 18 20   Temp: 98.8 F (37.1 C)  97.9 F (36.6 C) (!) 97.5 F (36.4 C)  TempSrc: Oral  Oral Oral  SpO2: 95% 97% 91% 93%  Weight:      Height:        Intake/Output Summary (Last 24 hours) at 10/25/2021 1147 Last data filed at 10/25/2021 0600 Gross per 24 hour  Intake --  Output 700 ml  Net -700 ml      10/23/2021    5:53 AM 10/19/2021   12:55 PM  09/29/2021    8:42 AM  Last 3 Weights  Weight (lbs) 250 lb 257 lb 6.4 oz 255 lb  Weight (kg) 113.399 kg 116.756 kg 115.667 kg     Body mass index is 32.98 kg/m.  General:  Well nourished, well developed, in no acute distress HEENT: normal Neck: no JVD Vascular: No carotid bruits; Distal pulses 2+ bilaterally Cardiac:  normal S1, S2; RRR; no murmur  Lungs:  clear to auscultation bilaterally, no wheezing, rhonchi or rales  Abd: soft, nontender, no hepatomegaly  Ext: 2-3 + edema below the knee Musculoskeletal:  No deformities, BUE and BLE strength normal and equal Skin: warm and dry  Neuro:  CNs 2-12 intact, no focal abnormalities noted Psych:  Normal affect   EKG:  The EKG was personally reviewed and demonstrates: October 19, 2021-atrial flutter, anterior and inferior T wave inversion Telemetry:  Telemetry was personally reviewed and demonstrates: Atrial flutter with controlled ventricular response  Chemistry Recent Labs  Lab 10/19/21 1400 10/24/21 0101 10/25/21 0042  NA 140 137 138  K 4.7 5.2* 5.1  CL 108 104 102  CO2 21* 23 24  GLUCOSE 221* 212* 129*  BUN 21 26* 31*  CREATININE 1.36* 1.58* 1.94*  CALCIUM 8.8* 8.6* 8.9  GFRNONAA 56* 47* 37*  ANIONGAP 11 10 12     Recent Labs  Lab 10/19/21 1400 10/25/21 0042  PROT 6.4* 7.0  ALBUMIN 3.5 3.5  AST 22 24  ALT 26 18  ALKPHOS 85 80  BILITOT 1.3* 1.0   Hematology Recent Labs  Lab 10/19/21 1400 10/25/21 0042  WBC 4.4 12.8*  RBC 4.36 4.46  HGB 13.7 13.7  HCT 42.9 45.0  MCV 98.4 100.9*  MCH 31.4 30.7  MCHC 31.9 30.4  RDW 12.9 13.2  PLT 173 173     Radiology/Studies:  DG Chest 2 View  Result Date: 10/25/2021 CLINICAL DATA:  Postop left upper lobectomy EXAM: CHEST - 2 VIEW COMPARISON:  Chest radiograph 1 day prior FINDINGS: The left apical chest tube has been removed. The cardiomediastinal silhouette is stable There is volume loss in the left hemithorax related to the recent surgery. The small residual left  pneumothorax is likely unchanged in size compared to the prior study following chest tube removal. Patchy opacities in the left base are again favored to reflect atelectasis, though infection or aspiration are not excluded. The right lung is clear. There is no significant pleural effusion. There is no right pneumothorax The bones are stable. There is persistent gaseous distention of the stomach. IMPRESSION: 1. Persistent small left pneumothorax following chest tube removal, likely not significantly changed in size. 2. Patchy densities in the left base again favored to reflect atelectasis. Electronically Signed   By: Valetta Mole M.D.   On: 10/25/2021 08:47   DG Chest Port 1 View  Result Date: 10/24/2021 CLINICAL DATA:  Postop day 1 status post left upper lobectomy and left video thoracoscopy for left upper lobe carcinoid tumor. EXAM: PORTABLE CHEST 1 VIEW COMPARISON:  10/23/2021 FINDINGS: Suspected small persistent left apical pneumothorax adjacent to the margin of the chest tube, around 5% of left hemithoracic volume. Left upper lobectomy with volume loss in the left hemithorax. Progressive left perihilar density probably from atelectasis although technically nonspecific. Mild interstitial accentuation in the right lung is present but has improved in the right upper lobe. Low lung volumes are also present on the right side. Moderately widened mediastinum. Lower thoracic spondylosis. IMPRESSION: 1. Small persistent left apical pneumothorax. Left chest tube in place. 2. Increased left perihilar airspace opacity, probably atelectasis given the degree of volume loss, although technically nonspecific. 3. Overall very low lung volumes. 4. Mild improvement in the bandlike opacities and interstitial opacity in the right upper lobe. 5. Continued widened mediastinum. Electronically Signed   By: Van Clines M.D.   On: 10/24/2021 08:21   DG Chest Port 1 View  Result Date: 10/23/2021 CLINICAL DATA:  Postop day 0  status post left upper lobectomy and left video thoracoscopy for left upper lobe carcinoid tumor. EXAM: PORTABLE CHEST 1 VIEW COMPARISON:  10/19/2021 FINDINGS: Interval left upper lobectomy, with left chest tube in place. There is a 10% left  apical pneumothorax. Low lung volumes are present, causing crowding of the pulmonary vasculature. Indistinct right upper lobe airspace opacity, probably from atelectasis given rightward shift of mediastinal structures thoracic spondylosis. Indistinctness of the left upper mediastinum and aortic arch, probably from mediastinal edema or blood products. IMPRESSION: 1. Status post left upper lobectomy, with 10% left apical pneumothorax. A left chest tube is in place. 2. Thickening of the upper mediastinum with indistinctness of the aortic arch, probably from mediastinal edema or blood products. 3. Indistinct airspace opacities in the right upper lobe, likely from atelectasis given the rightward mediastinal shift. 4. Low lung volumes are present, causing crowding of the pulmonary vasculature. Electronically Signed   By: Van Clines M.D.   On: 10/23/2021 11:31     Assessment and Plan:   Paroxysmal atrial flutter-patient was diagnosed with atrial flutter in March.  He underwent successful cardioversion in May.  However his atrial flutter has recurred.  Patient also transiently bradycardic yesterday.  I agree with decreasing carvedilol to 6.25 mg twice daily and following heart rate.  CHA2DS2-VASc is 5 and would therefore continue apixaban 5 mg twice daily.  Note previous echocardiogram showed normal LV function.  Patient can follow-up with Dr. Harl Bowie after discharge to discuss further management.  Options would include atrial flutter ablation or rate control and anticoagulation.  I would be hesitant to discontinue anticoagulation even if patient had ablation due to history of CVA. Lower extremity edema-this apparently has been a chronic issue for the patient though  worsened in the past 2 weeks.  He has 2-3+ edema on exam today.  Amlodipine certainly could be contributing.  I will discontinue.  We will give Lasix in the next 24 to 48 hours if renal function stabilizes. Acute on chronic stage IIIa kidney disease-creatinine has increased.  There may be a component of ATN due to transient hypotension that occurred yesterday with systolic blood pressure documented at 69.  We will continue to follow creatinine. Status post resection of lung tumor (carcinoid)-Per cardiothoracic surgery. Hypertension-as outlined above we will discontinue amlodipine as this could be contributing to lower extremity edema.  If blood pressure increases would likely add hydralazine.  Resume outpatient ARB later once renal function stabilized. Hyperlipidemia-continue statin. Thoracic aortic aneurysm-patient will need follow-up imaging March 2023.   Risk Assessment/Risk Scores:   CHA2DS2-VASc Score = 5    This indicates a 7.2% annual risk of stroke. The patient's score is based upon: CHF History: 0 HTN History: 1 Diabetes History: 0 Stroke History: 2 Vascular Disease History: 1 Age Score: 1 Gender Score: 0     For questions or updates, please contact [facility] Please consult [email or web address] for contact info under    Signed, Kirk Ruths, MD  10/25/2021 11:47 AM

## 2021-10-25 NOTE — Inpatient Diabetes Management (Signed)
Inpatient Diabetes Program Recommendations  AACE/ADA: New Consensus Statement on Inpatient Glycemic Control (2015)  Target Ranges:  Prepandial:   less than 140 mg/dL      Peak postprandial:   less than 180 mg/dL (1-2 hours)      Critically ill patients:  140 - 180 mg/dL   Lab Results  Component Value Date   GLUCAP 153 (H) 10/25/2021   HGBA1C 7.6 (H) 10/19/2021    Review of Glycemic Control  Latest Reference Range & Units 10/24/21 16:31 10/24/21 21:17 10/25/21 06:31 10/25/21 11:23  Glucose-Capillary 70 - 99 mg/dL 145 (H) 150 (H) 155 (H) 153 (H)  (H): Data is abnormally high Diabetes history: Type 2 DM Outpatient Diabetes medications: Farxiga 10 mg QD Current orders for Inpatient glycemic control: Novolog 0-24 units TID & HS  Inpatient Diabetes Program Recommendations:    Noted consult and renal status.  Could consider adding Farxiga 10 mg QD as long as GFR >30. It is also reasonable to wait to restart until discharge. Secure sent to [place], [place] to discuss. Will follow.   Thanks, Bronson Curb, MSN, RNC-OB Diabetes Coordinator [phone] (8a-5p)

## 2021-10-26 ENCOUNTER — Inpatient Hospital Stay (HOSPITAL_COMMUNITY): Payer: Medicare HMO

## 2021-10-26 DIAGNOSIS — I483 Typical atrial flutter: Secondary | ICD-10-CM | POA: Diagnosis not present

## 2021-10-26 LAB — CBC
HCT: 40.7 % (ref 39.0–52.0)
Hemoglobin: 12.7 g/dL — ABNORMAL LOW (ref 13.0–17.0)
MCH: 31.1 pg (ref 26.0–34.0)
MCHC: 31.2 g/dL (ref 30.0–36.0)
MCV: 99.8 fL (ref 80.0–100.0)
Platelets: 160 10*3/uL (ref 150–400)
RBC: 4.08 MIL/uL — ABNORMAL LOW (ref 4.22–5.81)
RDW: 13.1 % (ref 11.5–15.5)
WBC: 10.3 10*3/uL (ref 4.0–10.5)
nRBC: 0 % (ref 0.0–0.2)

## 2021-10-26 LAB — GLUCOSE, CAPILLARY
Glucose-Capillary: 169 mg/dL — ABNORMAL HIGH (ref 70–99)
Glucose-Capillary: 172 mg/dL — ABNORMAL HIGH (ref 70–99)
Glucose-Capillary: 223 mg/dL — ABNORMAL HIGH (ref 70–99)
Glucose-Capillary: 158 mg/dL — ABNORMAL HIGH (ref 70–99)

## 2021-10-26 LAB — BASIC METABOLIC PANEL
Anion gap: 8 (ref 5–15)
BUN: 30 mg/dL — ABNORMAL HIGH (ref 8–23)
CO2: 25 mmol/L (ref 22–32)
Calcium: 8.7 mg/dL — ABNORMAL LOW (ref 8.9–10.3)
Chloride: 104 mmol/L (ref 98–111)
Creatinine, Ser: 1.56 mg/dL — ABNORMAL HIGH (ref 0.61–1.24)
GFR, Estimated: 48 mL/min — ABNORMAL LOW (ref >60)
Glucose, Bld: 156 mg/dL — ABNORMAL HIGH (ref 70–99)
Potassium: 4.6 mmol/L (ref 3.5–5.1)
Sodium: 137 mmol/L (ref 135–145)

## 2021-10-26 MED ORDER — FUROSEMIDE 10 MG/ML IJ SOLN
40.0000 mg | Freq: Once | INTRAMUSCULAR | Status: AC
  Administered 2021-10-26: 40 mg via INTRAVENOUS
  Filled 2021-10-26: qty 4

## 2021-10-26 NOTE — Evaluation (Addendum)
Physical Therapy Evaluation Patient Details Name: JORRELL KUSTER MRN: [id number] DOB: [birth year] Today's Date: 10/26/2021  History of Present Illness  71 yo male admitted 7/17 for elective LUL resection due to CA. PMhx: CVA, Aflutter, thoracic aortic aneurysm, HTN, HLD, DM, bil LE edema  Clinical Impression  Pt pleasant, in chair on arrival and end of session. On arrival on 5L with SPO2 96% with desaturation to 87% on RA with 4-6L used throughout gait with SPO2 92% with cues for breathing technique and posture. Pt normally independent and lifting weight regularly. Pt wil bil LE edema, decreased strength, decreased functional mobility and gait who will benefit from acute therapy to maximize mobility, safety and function to decrease burden of care.    Pt does have to be able to perform flight of stairs to get to his bedroom.   HR 71     Recommendations for follow up therapy are one component of a multi-disciplinary discharge planning process, led by the attending physician.  Recommendations may be updated based on patient status, additional functional criteria and insurance authorization.  Follow Up Recommendations Home health PT      Assistance Recommended at Discharge Intermittent Supervision/Assistance  Patient can return home with the following  Help with stairs or ramp for entrance;Assist for transportation;Assistance with cooking/housework;A little help with bathing/dressing/bathroom;A little help with walking and/or transfers    Equipment Recommendations Rollator (4 wheels)  Recommendations for Other Services       Functional Status Assessment Patient has had a recent decline in their functional status and demonstrates the ability to make significant improvements in function in a reasonable and predictable amount of time.     Precautions / Restrictions Precautions Precautions: Fall;Other (comment) Precaution Comments: watch sats      Mobility  Bed Mobility                General bed mobility comments: in chair on arrival and end of session    Transfers Overall transfer level: Needs assistance   Transfers: Sit to/from Stand Sit to Stand: Min guard           General transfer comment: cues for safety and hand placement    Ambulation/Gait Ambulation/Gait assistance: Min guard Gait Distance (Feet): 400 Feet Assistive device: Rolling walker (2 wheels) Gait Pattern/deviations: Step-through pattern, Decreased stride length, Trunk flexed   Gait velocity interpretation: <1.8 ft/sec, indicate of risk for recurrent falls   General Gait Details: 4 standing rest breaks propping on forearms for recovery, cues for posture and proximity to RW with gait  Stairs            Wheelchair Mobility    Modified Rankin (Stroke Patients Only)       Balance Overall balance assessment: Needs assistance   Sitting balance-Leahy Scale: Fair     Standing balance support: No upper extremity supported, Bilateral upper extremity supported, Reliant on assistive device for balance Standing balance-Leahy Scale: Fair Standing balance comment: pt able to static stand without UE support, reaching for support with walking                             Pertinent Vitals/Pain Pain Assessment Pain Assessment: 0-10 Pain Score: 3  Pain Location: incision Pain Descriptors / Indicators: Aching, Guarding Pain Intervention(s): Limited activity within patient's tolerance, Monitored during session, Repositioned    Home Living Family/patient expects to be discharged to:: Private residence Living Arrangements: Spouse/significant other Available Help at Discharge: Family;Available  PRN/intermittently Type of Home: House Home Access: Stairs to enter Entrance Stairs-Rails: Psychiatric nurse of Steps: 6 Alternate Level Stairs-Number of Steps: 14 Home Layout: Two level;Bed/bath upstairs Home Equipment: None      Prior Function Prior Level of  Function : Independent/Modified Independent             Mobility Comments: driving, goes to the gym to lift weights       Hand Dominance        Extremity/Trunk Assessment        Lower Extremity Assessment Lower Extremity Assessment: Generalized weakness;RLE deficits/detail;LLE deficits/detail RLE Deficits / Details: bil LE edema LLE Deficits / Details: bil LE edema    Cervical / Trunk Assessment Cervical / Trunk Assessment: Normal  Communication      Cognition Arousal/Alertness: Awake/alert Behavior During Therapy: Flat affect Overall Cognitive Status: Within Functional Limits for tasks assessed                                          General Comments      Exercises     Assessment/Plan    PT Assessment Patient needs continued PT services  PT Problem List Decreased strength;Decreased mobility;Decreased activity tolerance;Decreased balance;Decreased knowledge of use of DME;Cardiopulmonary status limiting activity;Decreased range of motion       PT Treatment Interventions Gait training;DME instruction;Therapeutic exercise;Balance training;Stair training;Functional mobility training;Therapeutic activities;Patient/family education    PT Goals (Current goals can be found in the Care Plan section)  Acute Rehab PT Goals Patient Stated Goal: return home PT Goal Formulation: With patient Time For Goal Achievement: 11/09/21 Potential to Achieve Goals: Fair    Frequency Min 3X/week     Co-evaluation               AM-PAC PT "6 Clicks" Mobility  Outcome Measure Help needed turning from your back to your side while in a flat bed without using bedrails?: None Help needed moving from lying on your back to sitting on the side of a flat bed without using bedrails?: A Little Help needed moving to and from a bed to a chair (including a wheelchair)?: A Little Help needed standing up from a chair using your arms (e.g., wheelchair or bedside  chair)?: A Little Help needed to walk in hospital room?: A Little Help needed climbing 3-5 steps with a railing? : A Little 6 Click Score: 19    End of Session Equipment Utilized During Treatment: Oxygen Activity Tolerance: Patient tolerated treatment well Patient left: in chair;with call bell/phone within reach;with chair alarm set Nurse Communication: Mobility status PT Visit Diagnosis: Other abnormalities of gait and mobility (R26.89);Muscle weakness (generalized) (M62.81)    Time: 6333-5456 PT Time Calculation (min) (ACUTE ONLY): 35 min   Charges:   PT Evaluation $PT Eval Moderate Complexity: 1 Mod PT Treatments $Gait Training: 8-22 mins        Bayard Males, PT Acute Rehabilitation Services Office: [place] 10/26/2021, 1:56 PM

## 2021-10-26 NOTE — Plan of Care (Signed)
  Problem: Education: Goal: Knowledge of General Education information will improve Description: Including pain rating scale, medication(s)/side effects and non-pharmacologic comfort measures Outcome: Progressing   Problem: Activity: Goal: Risk for activity intolerance will decrease Outcome: Progressing   Problem: Coping: Goal: Level of anxiety will decrease Outcome: Progressing   Problem: Elimination: Goal: Will not experience complications related to bowel motility Outcome: Progressing Goal: Will not experience complications related to urinary retention Outcome: Progressing   Problem: Pain Managment: Goal: General experience of comfort will improve Outcome: Progressing   Problem: Clinical Measurements: Goal: Respiratory complications will improve Outcome: Not Progressing   Problem: Nutrition: Goal: Adequate nutrition will be maintained Outcome: Not Progressing

## 2021-10-26 NOTE — TOC Progression Note (Signed)
Transition of Care [facility]) - Progression Note    Patient Details  Name: IZAIAS KRUPKA MRN: [id number] Date of Birth: 11-21-1950  Transition of Care [facility]) CM/SW [place], RN Phone Number:[phone]  10/26/2021, 2:11 PM  Clinical Narrative:    TOC following patient admitted for LUL resection due to cancer. Patient has home health recommendation. MD will need to enter [facility] orders, CM to offer patient choice for [facility] services. TOC will continue to follow.         Expected Discharge Plan and Services                                                 Social Determinants of Health (SDOH) Interventions    Readmission Risk Interventions     No data to display

## 2021-10-26 NOTE — Progress Notes (Signed)
Progress Note  Patient Name: Chris Lucas Date of Encounter: 10/26/2021  [facility] Cardiologist: Dr Phineas Inches  Subjective   No dyspnea; pain at incision  Inpatient Medications    Scheduled Meds:  acetaminophen  1,000 mg Oral Q6H   Or   acetaminophen (TYLENOL) oral liquid 160 mg/5 mL  1,000 mg Oral Q6H   apixaban  5 mg Oral BID   atorvastatin  40 mg Oral Daily   bisacodyl  10 mg Oral Daily   carvedilol  6.25 mg Oral BID   insulin aspart  0-24 Units Subcutaneous TID AC & HS   pantoprazole  40 mg Oral Daily   senna-docusate  1 tablet Oral QHS   Continuous Infusions:  PRN Meds: bisacodyl, docusate sodium, hydrALAZINE, ipratropium-albuterol, morphine injection, ondansetron (ZOFRAN) IV, mouth rinse, oxyCODONE, polyethylene glycol, traMADol   Vital Signs    Vitals:   10/25/21 2322 10/26/21 0329 10/26/21 0400 10/26/21 0745  BP: 113/79 138/87  122/83  Pulse: 70  67 69  Resp: 15  20 19   Temp: 98.5 F (36.9 C) 97.8 F (36.6 C)    TempSrc: Oral Oral    SpO2: 95%  90% 94%  Weight:      Height:        Intake/Output Summary (Last 24 hours) at 10/26/2021 0802 Last data filed at 10/25/2021 2325 Gross per 24 hour  Intake 200 ml  Output 675 ml  Net -475 ml      10/23/2021    5:53 AM 10/19/2021   12:55 PM 09/29/2021    8:42 AM  Last 3 Weights  Weight (lbs) 250 lb 257 lb 6.4 oz 255 lb  Weight (kg) 113.399 kg 116.756 kg 115.667 kg      Telemetry    Atrial flutter rate controlled - Personally Reviewed   Physical Exam   GEN: No acute distress.   Neck: supple Cardiac: irregular Respiratory: Diminished BS LLL GI: Soft, nontender, non-distended  MS: 3+ edema below knee Neuro:  Nonfocal  Psych: Normal affect   Labs    Chemistry Recent Labs  Lab 10/19/21 1400 10/24/21 0101 10/25/21 0042 10/26/21 0048  NA 140 137 138 137  K 4.7 5.2* 5.1 4.6  CL 108 104 102 104  CO2 21* 23 24 25   GLUCOSE 221* 212* 129* 156*  BUN 21 26* 31* 30*  CREATININE 1.36*  1.58* 1.94* 1.56*  CALCIUM 8.8* 8.6* 8.9 8.7*  PROT 6.4*  --  7.0  --   ALBUMIN 3.5  --  3.5  --   AST 22  --  24  --   ALT 26  --  18  --   ALKPHOS 85  --  80  --   BILITOT 1.3*  --  1.0  --   GFRNONAA 56* 47* 37* 48*  ANIONGAP 11 10 12 8      Hematology Recent Labs  Lab 10/19/21 1400 10/25/21 0042 10/26/21 0048  WBC 4.4 12.8* 10.3  RBC 4.36 4.46 4.08*  HGB 13.7 13.7 12.7*  HCT 42.9 45.0 40.7  MCV 98.4 100.9* 99.8  MCH 31.4 30.7 31.1  MCHC 31.9 30.4 31.2  RDW 12.9 13.2 13.1  PLT 173 173 160    Radiology    DG Chest 2 View  Result Date: 10/26/2021 CLINICAL DATA:  Surgery follow-up. EXAM: CHEST - 2 VIEW COMPARISON:  10/25/2021. FINDINGS: Heart is enlarged and the mediastinal contour is stable. There is chronic elevation of the left diaphragm. Stable airspace disease is noted at  the lung bases bilaterally. No effusion is identified. There is a persistent small left apical pneumothorax, not significantly changed. No acute osseous abnormality. IMPRESSION: 1. Persistent small left pneumothorax, not significantly changed from the prior exam. 2. Elevation of the left diaphragm with stable airspace disease at the lung bases. Electronically Signed   By: Brett Fairy M.D.   On: 10/26/2021 04:58   DG Chest 2 View  Result Date: 10/25/2021 CLINICAL DATA:  Postop left upper lobectomy EXAM: CHEST - 2 VIEW COMPARISON:  Chest radiograph 1 day prior FINDINGS: The left apical chest tube has been removed. The cardiomediastinal silhouette is stable There is volume loss in the left hemithorax related to the recent surgery. The small residual left pneumothorax is likely unchanged in size compared to the prior study following chest tube removal. Patchy opacities in the left base are again favored to reflect atelectasis, though infection or aspiration are not excluded. The right lung is clear. There is no significant pleural effusion. There is no right pneumothorax The bones are stable. There is  persistent gaseous distention of the stomach. IMPRESSION: 1. Persistent small left pneumothorax following chest tube removal, likely not significantly changed in size. 2. Patchy densities in the left base again favored to reflect atelectasis. Electronically Signed   By: Valetta Mole M.D.   On: 10/25/2021 08:47      Patient Profile     71 y.o. 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  Chest CT March 2023 showed left upper lobe mass and thoracic aortic aneurysm at 4.8 cm.  Echocardiogram March 2023 showed normal LV function, moderate left ventricular hypertrophy, moderate left atrial enlargement.  Nuclear study May 2023 showed ejection fraction 63% with normal perfusion.  Patient is status post resection of carcinoid tumor July 17.  Noted to have atrial flutter and lower extremity edema and cardiology asked to evaluate.  Assessment & Plan    Paroxysmal atrial flutter-patient was diagnosed with atrial flutter in March.  He underwent successful cardioversion in May.  However his atrial flutter has recurred.  Heart rate is controlled.  Continue carvedilol at 6.25 mg twice daily.  Continue apixaban.  Note previous echocardiogram showed normal LV function.  Patient can follow-up with Dr. Harl Bowie after discharge to discuss further management.  Options would include atrial flutter ablation or rate control and anticoagulation.  I would be hesitant to discontinue anticoagulation even if patient had ablation due to history of CVA. Lower extremity edema-patient has had peripheral edema chronically but has been worse recently.  His renal function is mildly improved today.  I will give him Lasix 40 mg IV x1 and continue to follow.   Acute on chronic stage IIIa kidney disease-this was  felt secondary to ATN secondary to transient hypotension.  Some improvement in creatinine today.  We will continue to follow.   Status post resection of lung tumor (carcinoid)-Per cardiothoracic surgery. Hypertension-blood pressure is controlled this morning.  As outlined previously we discontinued amlodipine as this could be contributing to lower extremity edema.  We will add hydralazine if blood pressure increases.  We will also resume ARB once it is clear renal function is stable.   Hyperlipidemia-continue statin. Thoracic aortic aneurysm-patient will need follow-up imaging March 2023.  For questions or updates, please contact [place] Please consult [email or web address] for contact info under  Signed, Kirk Ruths, MD  10/26/2021, 8:02 AM

## 2021-10-26 NOTE — Care Management Important Message (Signed)
Important Message  Patient Details  Name: Chris Lucas MRN: [id number] Date of Birth: 12-17-1950   Medicare Important Message Given:  Yes     Orbie Pyo 10/26/2021, 2:40 PM

## 2021-10-26 NOTE — Progress Notes (Signed)
SATURATION QUALIFICATIONS: (This note is used to comply with regulatory documentation for home oxygen)  Patient Saturations on Room Air at Rest = 87%  Patient Saturations on 3L at rest= 93%  Patient Saturations on 4 Liters of oxygen while Ambulating = 92%  Please briefly explain why patient needs home oxygen:Pt with desaturation on RA at rest and requires supplemental oxygen to keep SpO2 >89%  [facility] Office: [phone]

## 2021-10-26 NOTE — Progress Notes (Addendum)
[address]       [place] [address]             [phone]      3 Days Post-Op Procedure(s) (LRB): XI ROBOTIC ASSISTED THORASCOPY-LEFT UPPER LOBECTOMY (Left) INTERCOSTAL NERVE BLOCK NODE DISSECTION Subjective: Sitting up trying to eat breakfast but does not have much appetite. Says pain is controlled, says he doesn't feel he is able to get a full breath.  Had a small BM.  O2 at 5L/[place]  Objective: Vital signs in last 24 hours: Temp:  [97.5 F (36.4 C)-98.5 F (36.9 C)] 97.8 F (36.6 C) (07/20 0329) Pulse Rate:  [67-70] 69 (07/20 0745) Cardiac Rhythm: Atrial flutter (07/20 0700) Resp:  [15-20] 19 (07/20 0745) BP: (113-146)/(79-89) 122/83 (07/20 0745) SpO2:  [90 %-95 %] 94 % (07/20 0745)  Hemodynamic parameters for last 24 hours:    Intake/Output from previous day: 07/19 0701 - 07/20 0700 In: 200 [P.O.:200] Out: 675 [Urine:675] Intake/Output this shift: No intake/output data recorded.  General appearance: alert, cooperative, and no distress Neurologic: intact Heart: A-flutter with controlled VR early post-op, currently SR in the 70's. BP improved to 412-878 systolic. Lungs: breath sounds shallow on the left, full and clear on the right.  Abdomen: firm, non-tender Extremities: mild LE edema Wound: the port incisions and the CT exit site are intact and dry  Lab Results: Recent Labs    10/25/21 0042 10/26/21 0048  WBC 12.8* 10.3  HGB 13.7 12.7*  HCT 45.0 40.7  PLT 173 160   BMET:  Recent Labs    10/25/21 0042 10/26/21 0048  NA 138 137  K 5.1 4.6  CL 102 104  CO2 24 25  GLUCOSE 129* 156*  BUN 31* 30*  CREATININE 1.94* 1.56*  CALCIUM 8.9 8.7*     PT/INR: No results for input(s): "LABPROT", "INR" in the last 72 hours. ABG    Component Value Date/Time   PHART 7.38 10/19/2021 1400   HCO3 27.2 10/19/2021 1400   O2SAT 97.4 10/19/2021 1400   CBG (last 3)  Recent Labs    10/25/21 1640 10/25/21 2114 10/26/21 0615  GLUCAP  146* 168* 169*   CLINICAL DATA:  Surgery follow-up.   EXAM: CHEST - 2 VIEW   COMPARISON:  10/25/2021.   FINDINGS: Heart is enlarged and the mediastinal contour is stable. There is chronic elevation of the left diaphragm. Stable airspace disease is noted at the lung bases bilaterally. No effusion is identified. There is a persistent small left apical pneumothorax, not significantly changed. No acute osseous abnormality.   IMPRESSION: 1. Persistent small left pneumothorax, not significantly changed from the prior exam. 2. Elevation of the left diaphragm with stable airspace disease at the lung bases.     Electronically Signed   By: Brett Fairy M.D.   On: 10/26/2021 04:58  Assessment/Plan: S/P Procedure(s) (LRB): XI ROBOTIC ASSISTED THORASCOPY-LEFT UPPER LOBECTOMY (Left) INTERCOSTAL NERVE BLOCK NODE DISSECTION  -POD3 robotic-assisted left upper lobectomy for carcinoid tumor. Respiratory effort somewhat limited by elevation and poor function of the left hemidiaphragm. CXR is stable. Made some progress with mobility yesterday.    -Hypotension / bradycardia- resolved with adjustment in medications. Appreciate assistance from cardiology.   -H/O PAF- currently in SR, will  resume his Eliquis today.   -Hyperglycemia- pt not aware of being diagnosed with DM. Recent HA1C was 7.6 and he was recently started on Farxiga by his PCP, hold off on re-starting for now. Glucose control  adequate. Continue CBG's and SSI.   -CKD- creat ut to 1.9 yesterday after being hypotensive for several hours the previous day. Now trending back to baseline of ~1.5. Has 2-3+ LE edema that was present prior to surgery. He had been prescribed Lasix but had stopped taking it.  Cardiology planning to restart diuresis.    -DVT PPX-continue SQ enoxaparin and ambulate  -Thoracic aortic aneurysm- 4.8cm incidentally found on workup for lung mass. Echo in march shows tri-leaflet aortic valve and no AS. Will need  surveillance with repeat imaging in 6 months.     LOS: 3 days    Antony Odea, [place] [phone] 10/26/2021  Agree with above Doing better today Continue IS, ambulation Dispo planning  [place]

## 2021-10-27 ENCOUNTER — Inpatient Hospital Stay (HOSPITAL_COMMUNITY): Payer: Medicare HMO

## 2021-10-27 DIAGNOSIS — I483 Typical atrial flutter: Secondary | ICD-10-CM | POA: Diagnosis not present

## 2021-10-27 LAB — BASIC METABOLIC PANEL
Anion gap: 6 (ref 5–15)
BUN: 31 mg/dL — ABNORMAL HIGH (ref 8–23)
CO2: 29 mmol/L (ref 22–32)
Calcium: 8.8 mg/dL — ABNORMAL LOW (ref 8.9–10.3)
Chloride: 103 mmol/L (ref 98–111)
Creatinine, Ser: 1.53 mg/dL — ABNORMAL HIGH (ref 0.61–1.24)
GFR, Estimated: 49 mL/min — ABNORMAL LOW (ref >60)
Glucose, Bld: 154 mg/dL — ABNORMAL HIGH (ref 70–99)
Potassium: 4.8 mmol/L (ref 3.5–5.1)
Sodium: 138 mmol/L (ref 135–145)

## 2021-10-27 LAB — GLUCOSE, CAPILLARY
Glucose-Capillary: 152 mg/dL — ABNORMAL HIGH (ref 70–99)
Glucose-Capillary: 223 mg/dL — ABNORMAL HIGH (ref 70–99)
Glucose-Capillary: 133 mg/dL — ABNORMAL HIGH (ref 70–99)
Glucose-Capillary: 166 mg/dL — ABNORMAL HIGH (ref 70–99)

## 2021-10-27 MED ORDER — TORSEMIDE 20 MG PO TABS
20.0000 mg | ORAL_TABLET | Freq: Every day | ORAL | Status: DC
  Administered 2021-10-27: 20 mg via ORAL
  Filled 2021-10-27: qty 1

## 2021-10-27 NOTE — Progress Notes (Signed)
Progress Note  Patient Name: Chris Lucas Date of Encounter: 10/27/2021  [facility] Cardiologist: Dr Phineas Inches  Subjective   Denies CP and dyspnea  Inpatient Medications    Scheduled Meds:  acetaminophen  1,000 mg Oral Q6H   Or   acetaminophen (TYLENOL) oral liquid 160 mg/5 mL  1,000 mg Oral Q6H   apixaban  5 mg Oral BID   atorvastatin  40 mg Oral Daily   bisacodyl  10 mg Oral Daily   carvedilol  6.25 mg Oral BID   insulin aspart  0-24 Units Subcutaneous TID AC & HS   pantoprazole  40 mg Oral Daily   senna-docusate  1 tablet Oral QHS   Continuous Infusions:  PRN Meds: bisacodyl, docusate sodium, hydrALAZINE, ipratropium-albuterol, morphine injection, ondansetron (ZOFRAN) IV, mouth rinse, oxyCODONE, polyethylene glycol, traMADol   Vital Signs    Vitals:   10/26/21 1702 10/26/21 2019 10/27/21 0004 10/27/21 0458  BP: 111/87 112/81 (!) 123/91 115/90  Pulse: 72   67  Resp: 19   18  Temp: 97.6 F (36.4 C) 98.3 F (36.8 C) 97.8 F (36.6 C) (!) 97.5 F (36.4 C)  TempSrc: Oral Oral Oral Oral  SpO2: 95%   95%  Weight:      Height:        Intake/Output Summary (Last 24 hours) at 10/27/2021 0732 Last data filed at 10/27/2021 0458 Gross per 24 hour  Intake 480 ml  Output 1050 ml  Net -570 ml       10/23/2021    5:53 AM 10/19/2021   12:55 PM 09/29/2021    8:42 AM  Last 3 Weights  Weight (lbs) 250 lb 257 lb 6.4 oz 255 lb  Weight (kg) 113.399 kg 116.756 kg 115.667 kg      Telemetry    Atrial flutter rate controlled - Personally Reviewed   Physical Exam   GEN: NAD Neck: supple, no JVD Cardiac: irregular, no gallop Respiratory: Diminished BS LLL; no wheeze GI: Soft, NT/ND MS: 3+ edema below knee Neuro:  Grossly intact Psych: Normal affect   Labs    Chemistry Recent Labs  Lab 10/25/21 0042 10/26/21 0048 10/27/21 0102  NA 138 137 138  K 5.1 4.6 4.8  CL 102 104 103  CO2 24 25 29   GLUCOSE 129* 156* 154*  BUN 31* 30* 31*  CREATININE  1.94* 1.56* 1.53*  CALCIUM 8.9 8.7* 8.8*  PROT 7.0  --   --   ALBUMIN 3.5  --   --   AST 24  --   --   ALT 18  --   --   ALKPHOS 80  --   --   BILITOT 1.0  --   --   GFRNONAA 37* 48* 49*  ANIONGAP 12 8 6       Hematology Recent Labs  Lab 10/25/21 0042 10/26/21 0048  WBC 12.8* 10.3  RBC 4.46 4.08*  HGB 13.7 12.7*  HCT 45.0 40.7  MCV 100.9* 99.8  MCH 30.7 31.1  MCHC 30.4 31.2  RDW 13.2 13.1  PLT 173 160     Radiology    DG Chest 2 View  Result Date: 10/26/2021 CLINICAL DATA:  Surgery follow-up. EXAM: CHEST - 2 VIEW COMPARISON:  10/25/2021. FINDINGS: Heart is enlarged and the mediastinal contour is stable. There is chronic elevation of the left diaphragm. Stable airspace disease is noted at the lung bases bilaterally. No effusion is identified. There is a persistent small left apical pneumothorax, not significantly changed.  No acute osseous abnormality. IMPRESSION: 1. Persistent small left pneumothorax, not significantly changed from the prior exam. 2. Elevation of the left diaphragm with stable airspace disease at the lung bases. Electronically Signed   By: Brett Fairy M.D.   On: 10/26/2021 04:58      Patient Profile     71 y.o. 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  Chest CT March 2023 showed left upper lobe mass and thoracic aortic aneurysm at 4.8 cm.  Echocardiogram March 2023 showed normal LV function, moderate left ventricular hypertrophy, moderate left atrial enlargement.  Nuclear study May 2023 showed ejection fraction 63% with normal perfusion.  Patient is status post resection of carcinoid tumor July 17.  Noted to have atrial flutter and lower extremity edema and cardiology asked to evaluate.  Assessment & Plan    Paroxysmal  atrial flutter-patient was diagnosed with atrial flutter in March.  He underwent successful cardioversion in May.  However his atrial flutter has recurred.  Heart rate is controlled.  Continue carvedilol at 6.25 mg twice daily.  Continue apixaban.  Note previous echocardiogram showed normal LV function.  As outlined previously patient can follow-up with Dr. Harl Bowie after discharge to discuss further management.  Options would include atrial flutter ablation or rate control and anticoagulation.  I would be hesitant to discontinue anticoagulation even if patient had ablation due to history of CVA. Lower extremity edema-lower extremity edema persists.  We will treat with Demadex 20 mg daily.  Follow renal function. Acute on chronic stage IIIa kidney disease-creatinine unchanged today. Status post resection of lung tumor (carcinoid)-Per cardiothoracic surgery. Hypertension-blood pressure continues to be controlled at present.  Amlodipine discontinued to see if this is contributing to lower extremity edema.  Can resume ARB if blood pressure increases assuming renal function is stable.   Hyperlipidemia-continue statin. Thoracic aortic aneurysm-patient will need follow-up imaging March 2023.  For questions or updates, please contact [place] Please consult [email or web address] for contact info under        Signed, Kirk Ruths, MD  10/27/2021, 7:32 AM

## 2021-10-27 NOTE — Plan of Care (Signed)

## 2021-10-27 NOTE — Discharge Summary (Addendum)
Physician Discharge Summary  Patient ID: Chris Lucas MRN: [id number] DOB/AGE: 1950-12-01 71 y.o.  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 male presents for surgical evaluation of a left upper lobe carcinoid tumor, ascending aortic aneurysm, and chronically elevated left hemidiaphragm.  He has previously been seen by Dr. 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  He denies any respiratory symptoms.  He is a lifelong non-smoker, and remains very active with weightlifting and exercise.   This nodule was originally found incidentally when he presented with a stroke to [facility].  He was then noted to have a 3.5 cm left upper lobe macrolobulated mass.  During this hospitalization he was also noted to be in atrial fibrillation and was placed on Eliquis.   In addition, to the carcinoid tumor, he was also found to have a 4.8 cm ascending aortic aneurysm.  We reviewed the cross-sectional imaging.  The mass is quite central thus he could have a lobectomy.  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  There is no indication for surgical repair at this point.      In regards to the carcinoid tumor, his pulmonary function shows decreased numbers, but acceptable.  This is likely due to his chronically elevated left hemidiaphragm.  His sniff testing did show some motion, which indicates that his diaphragm is not paralyzed.  We discussed the risks and benefits of left robotic assisted thoracoscopy with left upper lobectomy.  He is agreeable to proceed.  We will assess his symptoms postoperatively to determine if anything needs to be done to his left hemidiaphragm in the future.  He will also need to be off of Eliquis for 3 days prior to surgery. He denies any shortness of breath, or coughs.  He denies any chest pain.  He has had some lower extremity swelling recently and was placed on Lasix.  Hospital Course: Chris Lucas was admitted for elective surgery on 10/23/2021.  He was taken to the operating room where robotic assisted left upper lobectomy was carried out as planned.  There were no operative complications.  Following the procedure, he was transferred to the postanesthesia care unit after being extubated in the OR.  He was later transferred to [facility].  He was started back on his usual antihypertensive medications on the first postoperative day which had historically maintained good blood pressure control as well as good rate control of his atrial arrhythmias.  He became hypotensive on the first postoperative day and required support with IV fluid boluses.  On the following day, he was noted to have mild increase in his creatinine from a baseline of around 1.3-1.9.  Creatinine trended back down the following day.  We reduced the dosing on his antihypertensive medications and carvedilol.  His blood pressure and heart rate improved.  He  progressed very slowly with mobility.  Small air leak he had early postoperatively resolved and the chest tube was removed routinely on postop day 2.  The atrial fibrillation and atrial flutter persisted.  We requested  consultation by cardiology.  The patient was seen by Dr. Stanford Breed.  Further adjustments were made in his medications.  He was started back on his Eliquis.  Diuresis was attempted with IV Lasix on postop day 3 with minimal response.  On postop day 4, Dr. Stanford Breed switched him to oral Demadex. Initial discharge planning was for Chris Lucas to return to home with home health PT and home health nursing.  We also arranged for home oxygen therapy.  However, by postop day 4 Dr. Kipp Brood the patient's debility was such that he would not be able to return home.  We requested PT and OT consults as well as consultation by the inpatient rehab team with the expectation that if he was not an appropriate candidate for did not have insurance coverage for inpatient rehab we would proceed with planning for discharge to a SNF.  However he is ambulating 400 ft without difficulty and is not felt to be a SNF or CIR candidate.  PT recs are for home health.  He was having continued issue weaning off oxygen.  Diuretic was changed to Lasix 40 mg IV bid on 07/23.  He has been placed on GDMT for his diastolic heart failure.  Unna boots were placed.  He had a good result in significant decrease in his edema from this as well as his diuretic therapy.  The Unna boots have subsequently been discontinued.  The patient's weight was trending down.  His oxygen demand decreased and he was successfully weaned to 2L via [place].  Incisions are noted to be healing well without evidence of infection.  He is tolerating diet.  Blood sugars have been under adequate control on insulin.  He will be resumed on his Wilder Glade for diabetes.  He will require close monitoring as outpatient by his primary.  He does maintain atrial flutter but his rate is well controlled on current medication regimen as well as Eliquis.  His most recent creatinine is 1.6 andhas been very stable in the 1.3-1.6 range for several days.  He has chronic stage III kidney disease.  He is noted not  to have a postoperative anemia.  Overall, at the time of discharge the patient is felt to be quite stable.    Consults: cardiology  Significant Diagnostic Studies:  Narrative & Impression  CLINICAL DATA:  Chest tube placement and lobectomy.   EXAM: PORTABLE CHEST 1 VIEW   COMPARISON:  10/27/2021   FINDINGS: Lungs are hypoinflated with significant stable volume loss of the left lung with elevation of the left hemidiaphragm. Slight worsening opacification over the left mid to lower lung adjacent the elevated hemidiaphragm likely effusion/atelectasis, although infection is possible. Mild linear atelectasis over the right base. Mild stable cardiomegaly. No evidence of pneumothorax. Remainder of the exam is unchanged.   IMPRESSION: 1. Slight worsening opacification over the left mid to lower lung adjacent the elevated left hemidiaphragm likely effusion/atelectasis, although infection is possible. Minimal linear atelectasis right base. 2. Stable cardiomegaly. 3. Stable volume loss of the left lung with elevation of the left hemidiaphragm.     Electronically Signed   By: Marin Olp M.D.   On: 10/29/2021 08:14     Treatments: Surgery  10/23/2021   Patient:  Chris Lucas Pre-Op Dx: Left upper lobe carcinoid  tumor   Post-op Dx:  same Procedure: - Robotic assisted left video thoracoscopy - Left upper lobectomy - Mediastinal lymph node sampling - Intercostal nerve block   Surgeon and Role:      * Lightfoot, Lucile Crater, MD - Primary   Assistant: Macarthur Critchley , PA-C  An experienced assistant was required given the complexity of this surgery and the standard of surgical care. The assistant was needed for exposure, dissection, suctioning, retraction of delicate tissues and sutures, instrument exchange and for overall help during this procedure.     Anesthesia  general EBL:  87ml Blood Administration: none Specimen:  left upper lobe, hilar and mediastinal lymph  nodes   Drains: 28 F argyle chest tube in left chest Counts: correct     Indications: 71 year old male with history of cerebrovascular event, was found to have a carcinoid tumor in his left upper lobe as well as a 4.8 cm ascending aortic aneurysm.  We reviewed the cross-sectional imaging the mass is quite central thus could have a lobectomy.  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  There is no indication for surgical repair at this point.     In regards to the carcinoid tumor, his pulmonary function shows decreased numbers, but acceptable.  This is likely due to his chronically elevated left hemidiaphragm.  His sniff testing did show some motion, which indicates that his diaphragm is not paralyzed.  We discussed the risks and benefits of left robotic assisted thoracoscopy with left upper lobectomy.  He is agreeable to proceed.  We will assess his symptoms postoperatively to determine if anything needs to be done to his left hemidiaphragm in the future.  He will also need to be off of Eliquis for 3 days prior to surgery.   Findings: Normal anatomy, normal appearing lymph nodes  Discharge Exam: Blood pressure 120/86, pulse 80, temperature 97.8 F (36.6 C), temperature source Oral, resp. rate 20, height 6\' 1"  (1.854 m), weight 113.4 kg, SpO2 93 %.   Disposition: General appearance: alert, cooperative, and no distress Heart: irregularly irregular rhythm Lungs: min dim in left base Abdomen: benign Extremities: minor edema Wound: incis healing well   Discharge Instructions     Discharge patient   Complete by: As directed    When home oxygen is arranged and has meds from the [facility] pharmacy   Discharge disposition: 01-Home or Self Care   Discharge patient date: 11/02/2021      Allergies as of 11/02/2021       Reactions   Ivp Dye [iodinated Contrast Media] Shortness Of Breath, Rash        Medication List     STOP taking these  medications    amLODipine 10 MG tablet Commonly known as: NORVASC   olmesartan 40 MG tablet Commonly known as: BENICAR       TAKE these medications    atorvastatin 40 MG tablet Commonly known as: LIPITOR Take 40 mg by mouth daily.   bisacodyl 5 MG EC tablet Commonly known as: DULCOLAX Take 5 mg by mouth daily as needed for moderate constipation.   carvedilol 6.25 MG tablet Commonly known as: COREG Take 1 tablet (6.25 mg total) by mouth 2 (two) times daily. What changed:  medication strength how much to take   dapagliflozin propanediol 10 MG Tabs tablet Commonly known as: FARXIGA Take 1 tablet (10 mg total) by mouth daily.   Eliquis 5 MG Tabs tablet Generic drug: apixaban  Take 5 mg by mouth 2 (two) times daily.   furosemide 40 MG tablet Commonly known as: LASIX Take 1 tablet (40 mg total) by mouth daily. Start taking on: November 03, 2021 What changed:  medication strength how much to take when to take this reasons to take this   irbesartan 150 MG tablet Commonly known as: AVAPRO Take 1 tablet (150 mg total) by mouth daily.   loratadine 10 MG tablet Commonly known as: CLARITIN Take 10 mg by mouth daily as needed for allergies.   ondansetron 4 MG disintegrating tablet Commonly known as: ZOFRAN-ODT Take 1 tablet (4 mg total) by mouth every 8 (eight) hours as needed for nausea or vomiting.   traMADol 50 MG tablet Commonly known as: ULTRAM Take 1 tablet (50 mg total) by mouth every 6 (six) hours as needed for up to 7 days (mild pain).   Vitamin D (Ergocalciferol) 1.25 MG (50000 UNIT) Caps capsule Commonly known as: DRISDOL Take 50,000 Units by mouth every Sunday.               Durable Medical Equipment  (From admission, onward)           Start     Ordered   11/02/21 0924  For home use only DME oxygen  Once       Question Answer Comment  Length of Need 6 Months   Mode or (Route) Nasal cannula   Liters per Minute 3   Oxygen delivery system  Gas      11/02/21 0926   10/30/21 1056  For home use only DME oxygen  Once       Question Answer Comment  Length of Need 6 Months   Mode or (Route) Nasal cannula   Liters per Minute 2   Frequency Continuous (stationary and portable oxygen unit needed)   Oxygen delivery system Gas      10/30/21 1057   10/27/21 1606  For home use only DME oxygen  Once       Question Answer Comment  Length of Need 6 Months   Mode or (Route) Nasal cannula   Liters per Minute 3   Frequency Continuous (stationary and portable oxygen unit needed)   Oxygen conserving device No   Oxygen delivery system Gas      07 /20/23 1606   10/27/21 1605  For home use only DME 4 wheeled rolling walker with seat  Once       Question:  Patient needs a walker to treat with the following condition  Answer:  Imbalance   10/26/21 1606            Follow-up Information     Lajuana Matte, MD. Go on 11/17/2021.   Specialty: Cardiothoracic Surgery Why: Your appointment is at 10:20 am Please arrive 30 minutes early for a chest x-ray to be performed by [facility] locate on the first floor of the same building. Contact information: [address] [place] [address] [phone]         Care, [facility] Follow up.   Specialty: Home Health Services Why: Your home health has been set up with [facility]. The office will call you with start of care information. If you have any questions or concerns please call the number listed above. Contact information: [place] [address] [phone]                 Signed: John Giovanni PA-C 11/02/2021,  9:44 AM

## 2021-10-27 NOTE — TOC Progression Note (Addendum)
Transition of Care [facility]) - Progression Note    Patient Details  Name: KOBYN KRAY MRN: [id number] Date of Birth: 02-21-1951  Transition of Care [facility]) CM/SW Glen White, RN Phone Number:[phone]  10/27/2021, 9:39 AM  Clinical Narrative:    CM received message that patient may need short term rehab. Patient currently has PT recommendation for home health % DME. Per [facility] PT note patient will not qualify for rehab. Patient and wife at bedside and made aware. Patient and wife are agreeable to home health. Patient has no preference as long as insurance will cover. Patient made aware that he will need home O2, patient is agreeable. Home O2 /DME rollator referral has been submitted to [facility] with [employer]. Both will be delivered to the room [facility] referral has been submitted to [place] with [facility] awaiting acceptance.   [facility] unable to accept due to staffing. Referral submitted to [facility] with [facility] Lemmings. Acceptance pending.   [place] referral accepted by [facility]. AVS updated.       Expected Discharge Plan and Services                                                 Social Determinants of Health (SDOH) Interventions    Readmission Risk Interventions    10/27/2021    9:39 AM  Readmission Risk Prevention Plan  Transportation Screening Complete  PCP or Specialist Appt within 5-7 Days Complete  Home Care Screening Complete  Medication Review (RN CM) Referral to Pharmacy

## 2021-10-27 NOTE — Discharge Instructions (Signed)

## 2021-10-27 NOTE — Progress Notes (Addendum)
[address]       [place] [address]             [phone]      4 Days Post-Op Procedure(s) (LRB): XI ROBOTIC ASSISTED THORASCOPY-LEFT UPPER LOBECTOMY (Left) INTERCOSTAL NERVE BLOCK NODE DISSECTION Subjective: No new concerns, breathing about the same.  Was on high-flow O2 earlier this morning but back on [place] with adequate sats now.   Had a BM yesterday.    Objective: Vital signs in last 24 hours: Temp:  [97.5 F (36.4 C)-98.3 F (36.8 C)] 97.5 F (36.4 C) (07/21 0458) Pulse Rate:  [67-72] 67 (07/21 0458) Cardiac Rhythm: Atrial flutter (07/20 2019) Resp:  [18-19] 18 (07/21 0458) BP: (111-123)/(81-91) 115/90 (07/21 0458) SpO2:  [92 %-95 %] 95 % (07/21 0458)     Intake/Output from previous day: 07/20 0701 - 07/21 0700 In: 480 [P.O.:480] Out: 1050 [Urine:1050] Intake/Output this shift: No intake/output data recorded.  General appearance: alert, cooperative, and no distress Neurologic: intact Heart: A-flutter with controlled VR, BP improved to 540-086 systolic. Lungs: breath sounds shallow shallow but no rales or wheezes  Abdomen: firm, non-tender Extremities: 3+ LE edema Wound: the port incisions and the CT exit site are intact and dry  Lab Results: Recent Labs    10/25/21 0042 10/26/21 0048  WBC 12.8* 10.3  HGB 13.7 12.7*  HCT 45.0 40.7  PLT 173 160    BMET:  Recent Labs    10/26/21 0048 10/27/21 0102  NA 137 138  K 4.6 4.8  CL 104 103  CO2 25 29  GLUCOSE 156* 154*  BUN 30* 31*  CREATININE 1.56* 1.53*  CALCIUM 8.7* 8.8*     PT/INR: No results for input(s): "LABPROT", "INR" in the last 72 hours. ABG    Component Value Date/Time   PHART 7.38 10/19/2021 1400   HCO3 27.2 10/19/2021 1400   O2SAT 97.4 10/19/2021 1400   CBG (last 3)  Recent Labs    10/26/21 1701 10/26/21 2141 10/27/21 0657  GLUCAP 223* 158* 152*     Assessment/Plan: S/P Procedure(s) (LRB): XI ROBOTIC ASSISTED THORASCOPY-LEFT UPPER LOBECTOMY  (Left) INTERCOSTAL NERVE BLOCK NODE DISSECTION  -POD4 robotic-assisted left upper lobectomy for carcinoid tumor. Respiratory effort somewhat limited by elevation and poor function of the left hemidiaphragm. CXR is showing increasing air space disease on the right.   -Hypotension / bradycardia- resolved with adjustment in medications.   -H/O PAF- Currently in a-flutter with CVR on carvedilol and Eliquis  -Hyperglycemia- pt not aware of being diagnosed with DM. Recent HA1C was 7.6 and he was recently started on Farxiga by his PCP, hold off on re-starting for now. Glucose control adequate. Continue CBG's and SSI.   -CKD- creat up to 1.9 on POD2 after being hypotensive for several hours the previous day. Now stable near baseline of ~1.5. Has 2-3+ LE edema that was present prior to surgery.Minimal response to Lasix yesterday. Dr. Stanford Breed switched to torsemide today.   -DVT PPX-continue SQ enoxaparin and ambulate  -Thoracic aortic aneurysm- 4.8cm incidentally found on workup for lung mass. Echo in march shows tri-leaflet aortic valve and no AS. Will need surveillance with repeat imaging in 6 months.   -Disposition-we have been planning eventual discharge to home with home health RN and PT but it is becoming apparent that he will require more assistance. Dr. Kipp Brood recommended and discussed inpatient rehab with patient and his wife this morning.  Will request TOC consult for [facility] is he  qualifies. Otherwise, will need further rehab at a SNF.   LOS: 4 days    Antony Odea, PA-C [phone] 10/27/2021  Agree with above. We will plan for rehab placement Continue pulmonary hygiene  Knoah Nedeau O Jannelle Notaro

## 2021-10-27 NOTE — Progress Notes (Signed)
Mobility Specialist Progress Note    10/27/21 1103  Mobility  Activity Ambulated with assistance in hallway  Level of Assistance Minimal assist, patient does 75% or more  Assistive Device Four wheel walker  Distance Ambulated (ft) 310 ft (5+305)  Activity Response Tolerated well  $Mobility charge 1 Mobility   Post-Mobility: 73 HR, 99% SpO2  Pt received sitting EOB and agreeable. Had small BM in BR. No complaints during. Encouraged pursed lip breathing. Returned to chair with call bell in reach.   Hildred Alamin Mobility Specialist

## 2021-10-27 NOTE — Progress Notes (Signed)
Inpatient Rehab Admissions Coordinator:   Consult received and chart reviewed.  Note pt ambulating 400' min guard with RW, and PT recommending HH.  Would not recommend CIR admit at this time.    Shann Medal, PT, DPT Admissions Coordinator [phone] 10/27/21  11:17 AM

## 2021-10-27 NOTE — Plan of Care (Signed)
  Problem: Activity: Goal: Risk for activity intolerance will decrease Outcome: Progressing   Problem: Nutrition: Goal: Adequate nutrition will be maintained Outcome: Progressing   Problem: Coping: Goal: Level of anxiety will decrease Outcome: Progressing   Problem: Elimination: Goal: Will not experience complications related to urinary retention Outcome: Progressing   Problem: Pain Managment: Goal: General experience of comfort will improve Outcome: Progressing   Problem: Clinical Measurements: Goal: Respiratory complications will improve Outcome: Not Progressing

## 2021-10-28 ENCOUNTER — Inpatient Hospital Stay (HOSPITAL_COMMUNITY): Payer: Medicare HMO

## 2021-10-28 DIAGNOSIS — Z902 Acquired absence of lung [part of]: Secondary | ICD-10-CM

## 2021-10-28 DIAGNOSIS — I4819 Other persistent atrial fibrillation: Secondary | ICD-10-CM

## 2021-10-28 LAB — GLUCOSE, CAPILLARY
Glucose-Capillary: 163 mg/dL — ABNORMAL HIGH (ref 70–99)
Glucose-Capillary: 230 mg/dL — ABNORMAL HIGH (ref 70–99)
Glucose-Capillary: 162 mg/dL — ABNORMAL HIGH (ref 70–99)
Glucose-Capillary: 148 mg/dL — ABNORMAL HIGH (ref 70–99)

## 2021-10-28 LAB — BASIC METABOLIC PANEL
Anion gap: 6 (ref 5–15)
BUN: 32 mg/dL — ABNORMAL HIGH (ref 8–23)
CO2: 31 mmol/L (ref 22–32)
Calcium: 8.3 mg/dL — ABNORMAL LOW (ref 8.9–10.3)
Chloride: 101 mmol/L (ref 98–111)
Creatinine, Ser: 1.65 mg/dL — ABNORMAL HIGH (ref 0.61–1.24)
GFR, Estimated: 45 mL/min — ABNORMAL LOW (ref >60)
Glucose, Bld: 150 mg/dL — ABNORMAL HIGH (ref 70–99)
Potassium: 4.7 mmol/L (ref 3.5–5.1)
Sodium: 138 mmol/L (ref 135–145)

## 2021-10-28 MED ORDER — FUROSEMIDE 10 MG/ML IJ SOLN
40.0000 mg | Freq: Two times a day (BID) | INTRAMUSCULAR | Status: DC
  Administered 2021-10-28 – 2021-10-30 (×6): 40 mg via INTRAVENOUS
  Filled 2021-10-28 (×6): qty 4

## 2021-10-28 NOTE — Progress Notes (Signed)
Progress Note  Patient Name: Chris Lucas Date of Encounter: 10/28/2021  [facility] Cardiologist:  Phineas Inches, MD   Subjective   71 year old gentleman with a history of atrial flutter, hypertension, chronic kidney disease, thoracic aortic aneurysm, carcinoid tumor, diabetes mellitus who we are seeing for further evaluation of atrial flutter.   The patient has a known aneurysm in his thoracic aorta.  It measures 4.8 cm.  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  He is status post resection of this left upper lobe mass.  He has a history of atrial flutter.  He underwent successful cardioversion on Sep 01, 2021.  He developed recurrent atrial fibrillation following his lung surgery. He remains on carvedilol 6.25 mg twice a day, Eliquis 5 mg twice a day.  Inpatient Medications    Scheduled Meds:  acetaminophen  1,000 mg Oral Q6H   Or   acetaminophen (TYLENOL) oral liquid 160 mg/5 mL  1,000 mg Oral Q6H   apixaban  5 mg Oral BID   atorvastatin  40 mg Oral Daily   bisacodyl  10 mg Oral Daily   carvedilol  6.25 mg Oral BID   insulin aspart  0-24 Units Subcutaneous TID AC & HS   pantoprazole  40 mg Oral Daily   senna-docusate  1 tablet Oral QHS   torsemide  20 mg Oral Daily   Continuous Infusions:  PRN Meds: bisacodyl, docusate sodium, hydrALAZINE, ipratropium-albuterol, morphine injection, ondansetron (ZOFRAN) IV, mouth rinse, oxyCODONE, polyethylene glycol, traMADol   Vital Signs    Vitals:   10/27/21 1925 10/28/21 0014 10/28/21 0646 10/28/21 0738  BP: 130/89 116/75 123/84 125/83  Pulse: 60 70  76  Resp: 19 19  18   Temp: 97.8 F (36.6 C) 98 F (36.7 C) 97.6 F (36.4 C) 98 F (36.7 C)  TempSrc: Oral Oral Oral Oral  SpO2: 96% 100%  95%  Weight:      Height:        Intake/Output Summary (Last 24 hours) at 10/28/2021 0938 Last data filed at 10/27/2021 1925 Gross per 24 hour  Intake 240 ml  Output --  Net 240 ml       10/23/2021    5:53 AM 10/19/2021   12:55 PM 09/29/2021    8:42 AM  Last 3 Weights  Weight (lbs) 250 lb 257 lb 6.4 oz 255 lb  Weight (kg) 113.399 kg 116.756 kg 115.667 kg      Telemetry    Atrial fib with controlled V response  - Personally Reviewed  ECG     - Personally Reviewed  Physical Exam   GEN: No acute distress.   Middle age male  Neck: No JVD Cardiac: irreg. Irreg.  Respiratory: Clear to auscultation bilaterally. GI: Soft, nontender, non-distended  MS:  2-3 + pitting edema  No deformity. Neuro:  Nonfocal  Psych: Normal affect   Labs    High Sensitivity Troponin:  No results for input(s): "TROPONINIHS" in the last 720 hours.   Chemistry Recent Labs  Lab 10/25/21 0042 10/26/21 0048 10/27/21 0102 10/28/21 0038  NA 138 137 138 138  K 5.1 4.6 4.8 4.7  CL 102 104 103 101  CO2 24 25 29 31   GLUCOSE 129* 156* 154* 150*  BUN 31* 30* 31* 32*  CREATININE 1.94* 1.56* 1.53* 1.65*  CALCIUM 8.9 8.7* 8.8* 8.3*  PROT 7.0  --   --   --   ALBUMIN 3.5  --   --   --  AST 24  --   --   --   ALT 18  --   --   --   ALKPHOS 80  --   --   --   BILITOT 1.0  --   --   --   GFRNONAA 37* 48* 49* 45*  ANIONGAP 12 8 6 6     Lipids No results for input(s): "CHOL", "TRIG", "HDL", "LABVLDL", "LDLCALC", "CHOLHDL" in the last 168 hours.  Hematology Recent Labs  Lab 10/25/21 0042 10/26/21 0048  WBC 12.8* 10.3  RBC 4.46 4.08*  HGB 13.7 12.7*  HCT 45.0 40.7  MCV 100.9* 99.8  MCH 30.7 31.1  MCHC 30.4 31.2  RDW 13.2 13.1  PLT 173 160   Thyroid No results for input(s): "TSH", "FREET4" in the last 168 hours.  BNPNo results for input(s): "BNP", "PROBNP" in the last 168 hours.  DDimer No results for input(s): "DDIMER" in the last 168 hours.   Radiology    DG CHEST PORT 1 VIEW  Result Date: 10/28/2021 CLINICAL DATA:  Status post lobectomy. EXAM: PORTABLE CHEST 1 VIEW COMPARISON:  One-view chest x-ray 10/27/2021 FINDINGS: Heart is enlarged. Chronic left hemidiaphragm elevation  noted. Small left apical pneumothorax remains. Bilateral lower lobe airspace disease is present. The diffuse interstitial pattern has increased slightly. IMPRESSION: 1. Persistent small left apical pneumothorax. 2. Bilateral lower lobe airspace disease remains concerning for infection. 3. Slight increase in diffuse interstitial pattern, likely reflecting edema. Electronically Signed   By: San Morelle M.D.   On: 10/28/2021 08:03   DG CHEST PORT 1 VIEW  Result Date: 10/27/2021 CLINICAL DATA:  Hypoxia. EXAM: PORTABLE CHEST 1 VIEW COMPARISON:  October 26, 2021. FINDINGS: Stable cardiomegaly. Minimal left apical pneumothorax is noted. Stable bilateral lung opacities are noted, left greater than right. Stable elevated left hemidiaphragm. Bony thorax is unremarkable. IMPRESSION: Minimal left apical pneumothorax.  Stable bilateral lung opacities. Electronically Signed   By: Marijo Conception M.D.   On: 10/27/2021 08:06    Cardiac Studies      Patient Profile        Assessment & Plan     Atrial fib:   He is developed recurrent atrial fibrillation following his lung surgery.  His rate is very well controlled.  Continue Eliquis and carvedilol 6.25 mg twice a day.  2.  Leg edema: He may benefit from some additional IV Lasix.   Will give lasix 40 IV bid for several days.  I have advised him to elevate his legs.  He may benefit from compression hose.     For questions or updates, please contact [place] Please consult [email or web address] for contact info under        Signed, Mertie Moores, MD  10/28/2021, 9:38 AM

## 2021-10-28 NOTE — Evaluation (Signed)
Occupational Therapy Evaluation Patient Details Name: Chris Lucas MRN: [id number] DOB: [birth year] Today's Date: 10/28/2021   History of Present Illness 71 yo male admitted 7/17 for elective LUL resection due to CA. PMhx: CVA, Aflutter, thoracic aortic aneurysm, HTN, HLD, DM, bil LE edema   Clinical Impression   Patient admitted for the diagnosis and procedure above.  PTA he lives with his spouse, remains active, and needed no assist with ADL/iADL or mobility.  Currently the patient needs setup and generalized supervision for ADL and in room mobility.  Decreased activity tolerance and incisional soreness are the barriers.  OT will follow in the acute setting.        Recommendations for follow up therapy are one component of a multi-disciplinary discharge planning process, led by the attending physician.  Recommendations may be updated based on patient status, additional functional criteria and insurance authorization.   Follow Up Recommendations  Follow physician's recommendations for discharge plan and follow up therapies    Assistance Recommended at Discharge Set up Supervision/Assistance  Patient can return home with the following Assist for transportation;Assistance with cooking/housework    Functional Status Assessment  Patient has had a recent decline in their functional status and demonstrates the ability to make significant improvements in function in a reasonable and predictable amount of time.  Equipment Recommendations  Tub/shower seat    Recommendations for Other Services       Precautions / Restrictions Precautions Precautions: Fall Precaution Comments: watch sats Restrictions Weight Bearing Restrictions: No      Mobility Bed Mobility               General bed mobility comments: sitting on EOB    Transfers Overall transfer level: Needs assistance   Transfers: Sit to/from Stand Sit to Stand: Supervision                  Balance Overall  balance assessment: Needs assistance Sitting-balance support: Feet supported Sitting balance-Leahy Scale: Good     Standing balance support: Bilateral upper extremity supported Standing balance-Leahy Scale: Fair                             ADL either performed or assessed with clinical judgement   ADL       Grooming: Wash/dry hands;Supervision/safety;Standing           Upper Body Dressing : Set up;Sitting;Standing   Lower Body Dressing: Supervision/safety;Sit to/from stand   Toilet Transfer: Supervision/safety;Regular Toilet;Rollator (4 wheels)                   Vision Patient Visual Report: No change from baseline       Perception Perception Perception: Not tested   Praxis Praxis Praxis: Not tested    Pertinent Vitals/Pain Pain Assessment Pain Assessment: Faces Faces Pain Scale: Hurts a little bit Pain Location: incision Pain Descriptors / Indicators: Tender, Sore Pain Intervention(s): Monitored during session     Hand Dominance Right   Extremity/Trunk Assessment Upper Extremity Assessment Upper Extremity Assessment: Overall WFL for tasks assessed   Lower Extremity Assessment Lower Extremity Assessment: Defer to PT evaluation   Cervical / Trunk Assessment Cervical / Trunk Assessment: Normal   Communication Communication Communication: No difficulties   Cognition Arousal/Alertness: Awake/alert Behavior During Therapy: Flat affect Overall Cognitive Status: Within Functional Limits for tasks assessed  General Comments   VSS on supplemental O2    Exercises     Shoulder Instructions      Home Living Family/patient expects to be discharged to:: Private residence Living Arrangements: Spouse/significant other Available Help at Discharge: Family;Available PRN/intermittently Type of Home: House Home Access: Stairs to enter [employer] of Steps: 6 Entrance  Stairs-Rails: Right;Left Home Layout: Two level;Bed/bath upstairs Alternate Level Stairs-Number of Steps: 14 Alternate Level Stairs-Rails: Left Bathroom Shower/Tub: Occupational psychologist: Standard Bathroom Accessibility: Yes How Accessible: Accessible via walker Home Equipment: None          Prior Functioning/Environment Prior Level of Function : Independent/Modified Independent               ADLs Comments: Independent with ADL and iADL        OT Problem List: Decreased activity tolerance;Impaired balance (sitting and/or standing);Decreased strength      OT Treatment/Interventions: Self-care/ADL training;Patient/family education;Balance training;Therapeutic activities;DME and/or AE instruction    OT Goals(Current goals can be found in the care plan section) Acute Rehab OT Goals Patient Stated Goal: Return home OT Goal Formulation: With patient Time For Goal Achievement: 11/10/21 Potential to Achieve Goals: Good ADL Goals Pt Will Perform Grooming: with modified independence;standing Pt Will Perform Lower Body Dressing: with modified independence;sit to/from stand Pt Will Transfer to Toilet: with modified independence;ambulating;regular height toilet  OT Frequency: Min 2X/week    Co-evaluation              AM-PAC OT "6 Clicks" Daily Activity     Outcome Measure Help from another person eating meals?: None Help from another person taking care of personal grooming?: A Little Help from another person toileting, which includes using toliet, bedpan, or urinal?: A Little Help from another person bathing (including washing, rinsing, drying)?: A Little Help from another person to put on and taking off regular upper body clothing?: A Little Help from another person to put on and taking off regular lower body clothing?: A Little 6 Click Score: 19   End of Session Equipment Utilized During Treatment: Rollator (4 wheels);Oxygen Nurse Communication: Mobility  status  Activity Tolerance: Patient tolerated treatment well Patient left: in chair;with call bell/phone within reach;with family/visitor present  OT Visit Diagnosis: Muscle weakness (generalized) (M62.81)                Time: 8882-8003 OT Time Calculation (min): 20 min Charges:  OT General Charges $OT Visit: 1 Visit OT Evaluation $OT Eval Moderate Complexity: 1 Mod  10/28/2021  RP, OTR/L  Acute Rehabilitation Services  Office:  [phone]   Chris Lucas 10/28/2021, 9:53 AM

## 2021-10-28 NOTE — Progress Notes (Signed)
Mobility Specialist Progress Note    10/28/21 1125  Mobility  Activity Ambulated with assistance in hallway  Level of Assistance Contact guard assist, steadying assist  Assistive Device Four wheel walker  Distance Ambulated (ft) 800 ft  Activity Response Tolerated well  $Mobility charge 1 Mobility   Pre-Mobility: 62 HR, 98% SpO2 Post-Mobility: 71 HR  Pt received sitting EOB and agreeable. No complaints on walk. Ambulated on 8LO2 maintaining SpO2 w/ reliable pleth >/=88%. Returned to sitting EOB with call bell in reach.  Hildred Alamin Mobility Specialist

## 2021-10-28 NOTE — Progress Notes (Signed)
Orthopedic Tech Progress Note Patient Details:  Chris Lucas 06-11-1950 [id number]  Unna boots applied to BLE. Change again on 7/26 or 7/27.  Ortho Devices Type of Ortho Device: Haematologist Ortho Device/Splint Location: BLE Ortho Device/Splint Interventions: Ordered, Application   Post Interventions Patient Tolerated: Well Instructions Provided: Care of device  [name] [name] 10/28/2021, 4:11 PM

## 2021-10-28 NOTE — Progress Notes (Addendum)
      [address]       [place] [address]             [phone]       5 Days Post-Op Procedure(s) (LRB): XI ROBOTIC ASSISTED THORASCOPY-LEFT UPPER LOBECTOMY (Left) INTERCOSTAL NERVE BLOCK NODE DISSECTION  Subjective: Patient sitting up at the side of the bed, in no distress.  Objective: Vital signs in last 24 hours: Temp:  [97.6 F (36.4 C)-98 F (36.7 C)] 98 F (36.7 C) (07/22 0738) Pulse Rate:  [60-87] 76 (07/22 0738) Cardiac Rhythm: Atrial flutter (07/21 1925) Resp:  [18-19] 18 (07/22 0738) BP: (110-130)/(66-89) 125/83 (07/22 0738) SpO2:  [93 %-100 %] 95 % (07/22 0738)     Intake/Output from previous day: 07/21 0701 - 07/22 0700 In: 240 [P.O.:240] Out: -    Physical Exam:  Cardiovascular: IRRR Pulmonary: Shallow breath sounds bilaterally Abdomen: Soft, non tender, bowel sounds present. Extremities:+++ lower extremity edema. Wounds: Clean and dry.  No erythema or signs of infection.   Lab Results: CBC: Recent Labs    10/26/21 0048  WBC 10.3  HGB 12.7*  HCT 40.7  PLT 160   BMET:  Recent Labs    10/27/21 0102 10/28/21 0038  NA 138 138  K 4.8 4.7  CL 103 101  CO2 29 31  GLUCOSE 154* 150*  BUN 31* 32*  CREATININE 1.53* 1.65*  CALCIUM 8.8* 8.3*    PT/INR: No results for input(s): "LABPROT", "INR" in the last 72 hours. ABG:  INR: Will add last result for INR, ABG once components are confirmed Will add last 4 CBG results once components are confirmed  Assessment/Plan:  1. CV - History of paroxsymal a flutter with CVR. S/p DCCV May. A flutter. On Coreg 6.25 mg bid and Apixaban 5 mg bid. History of ATAA that will need further surveillance after discharge. 2.  Pulmonary - On HFNC. He has been on [place] and HFNC post op .CXR this am shows persistent, small left apical pneumothorax with elevation of left hemidiaphragm, interstitial edema, and bilateral airspace disease.Encourage incentive spirometer 3. LE edema-on Torsemide 20 mg  daily. Cardiology following. Unna boots 4. DM-CBGs 133/166/163.  Pre op HGA1C 7.6. Will not restart Farxiga at this time. He will need medial follow up after discharge. 5. CKD (stage IIIA) Chronic Kidney Disease   Stage I     GFR >90  Stage II    GFR 60-89  Stage IIIA GFR 45-59  Stage IIIB GFR 30-44  Stage IV   GFR 15-29  Stage V    GFR  <15  Lab Results  Component Value Date   CREATININE 1.65 (H) 10/28/2021   Estimated Creatinine Clearance: 55.8 mL/min (A) (by C-G formula based on SCr of 1.65 mg/dL (H)). Creatinine this am slightly increased to 1.65. Check in am as on Demadex 6. On Lovenox for DVT prophylaxis 7. Regarding placement, PT does not recommend CIR as ambulating over 400 feet. Will see if a candidate for SNF;if not, home with [facility]   Donielle M ZimmermanPA-C 10/28/2021,8:29 AM [phone]   Feeling better after CT removal Starting to walk in hall Needs 4-5 L O2 with ambulation Cont current care , prob SNF next week  patient examined and medical record reviewed,agree with above note. Dahlia Byes 10/28/2021

## 2021-10-29 ENCOUNTER — Inpatient Hospital Stay (HOSPITAL_COMMUNITY): Payer: Medicare HMO

## 2021-10-29 DIAGNOSIS — I4819 Other persistent atrial fibrillation: Secondary | ICD-10-CM | POA: Diagnosis not present

## 2021-10-29 DIAGNOSIS — Z902 Acquired absence of lung [part of]: Secondary | ICD-10-CM | POA: Diagnosis not present

## 2021-10-29 LAB — BASIC METABOLIC PANEL
Anion gap: 7 (ref 5–15)
BUN: 25 mg/dL — ABNORMAL HIGH (ref 8–23)
CO2: 34 mmol/L — ABNORMAL HIGH (ref 22–32)
Calcium: 8.7 mg/dL — ABNORMAL LOW (ref 8.9–10.3)
Chloride: 98 mmol/L (ref 98–111)
Creatinine, Ser: 1.4 mg/dL — ABNORMAL HIGH (ref 0.61–1.24)
GFR, Estimated: 54 mL/min — ABNORMAL LOW (ref >60)
Glucose, Bld: 180 mg/dL — ABNORMAL HIGH (ref 70–99)
Potassium: 4 mmol/L (ref 3.5–5.1)
Sodium: 139 mmol/L (ref 135–145)

## 2021-10-29 LAB — GLUCOSE, CAPILLARY
Glucose-Capillary: 162 mg/dL — ABNORMAL HIGH (ref 70–99)
Glucose-Capillary: 202 mg/dL — ABNORMAL HIGH (ref 70–99)
Glucose-Capillary: 118 mg/dL — ABNORMAL HIGH (ref 70–99)
Glucose-Capillary: 164 mg/dL — ABNORMAL HIGH (ref 70–99)
Glucose-Capillary: 189 mg/dL — ABNORMAL HIGH (ref 70–99)

## 2021-10-29 NOTE — Plan of Care (Signed)
  Problem: Clinical Measurements: Goal: Respiratory complications will improve Outcome: Progressing Goal: Cardiovascular complication will be avoided Outcome: Progressing   Problem: Activity: Goal: Risk for activity intolerance will decrease Outcome: Progressing   Problem: Nutrition: Goal: Adequate nutrition will be maintained Outcome: Progressing   Problem: Coping: Goal: Level of anxiety will decrease Outcome: Progressing   Problem: Elimination: Goal: Will not experience complications related to bowel motility Outcome: Progressing Goal: Will not experience complications related to urinary retention Outcome: Progressing   Problem: Pain Managment: Goal: General experience of comfort will improve Outcome: Progressing

## 2021-10-29 NOTE — Progress Notes (Signed)
Progress Note  Patient Name: Chris Lucas Date of Encounter: 10/29/2021  [facility] Cardiologist:  Phineas Inches, MD   Subjective   71 year old gentleman with a history of atrial flutter, hypertension, chronic kidney disease, thoracic aortic aneurysm, carcinoid tumor, diabetes mellitus who we are seeing for further evaluation of atrial flutter.   The patient has a known aneurysm in his thoracic aorta.  It measures 4.8 cm.  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  He is status post resection of this left upper lobe mass.  He has a history of atrial flutter.  He underwent successful cardioversion on Sep 01, 2021.  He developed recurrent atrial fibrillation following his lung surgery. He remains on carvedilol 6.25 mg twice a day, Eliquis 5 mg twice a day.  We started him on IV lasix yesterday     Inpatient Medications    Scheduled Meds:  apixaban  5 mg Oral BID   atorvastatin  40 mg Oral Daily   bisacodyl  10 mg Oral Daily   carvedilol  6.25 mg Oral BID   furosemide  40 mg Intravenous BID   insulin aspart  0-24 Units Subcutaneous TID AC & HS   pantoprazole  40 mg Oral Daily   senna-docusate  1 tablet Oral QHS   Continuous Infusions:  PRN Meds: bisacodyl, docusate sodium, hydrALAZINE, ipratropium-albuterol, morphine injection, ondansetron (ZOFRAN) IV, mouth rinse, oxyCODONE, polyethylene glycol, traMADol   Vital Signs    Vitals:   10/28/21 1900 10/29/21 0100 10/29/21 0643 10/29/21 0805  BP: 128/84 (!) 134/97 (!) 131/98 131/90  Pulse: 77 77 82 72  Resp: 20 18 18    Temp: 97.9 F (36.6 C) 98 F (36.7 C) 98.6 F (37 C) 98.8 F (37.1 C)  TempSrc: Oral Oral Oral Oral  SpO2: 98% 97% 96% 99%  Weight:      Height:        Intake/Output Summary (Last 24 hours) at 10/29/2021 1022 Last data filed at 10/28/2021 1906 Gross per 24 hour  Intake 240 ml  Output 1350 ml  Net -1110 ml       10/23/2021    5:53 AM 10/19/2021   12:55 PM  09/29/2021    8:42 AM  Last 3 Weights  Weight (lbs) 250 lb 257 lb 6.4 oz 255 lb  Weight (kg) 113.399 kg 116.756 kg 115.667 kg      Telemetry    Atrial fib with controlled V response  - Personally Reviewed  ECG     - Personally Reviewed  Physical Exam   Physical Exam: Blood pressure 131/90, pulse 72, temperature 98.8 F (37.1 C), temperature source Oral, resp. rate 18, height 6\' 1"  (1.854 m), weight 113.4 kg, SpO2 99 %.  GEN:   eldelry male,  NAD  HEENT: Normal NECK: No JVD; No carotid bruits LYMPHATICS: No lymphadenopathy CARDIAC: irreg. Irreg.  RESPIRATORY:  Clear to auscultation without rales, wheezing or rhonchi  ABDOMEN: Soft, non-tender, non-distended MUSCULOSKELETAL:  legs are wrapped with ACE wrap.  Much less edema   SKIN: Warm and dry NEUROLOGIC:  Alert and oriented x 3   Labs    High Sensitivity Troponin:  No results for input(s): "TROPONINIHS" in the last 720 hours.   Chemistry Recent Labs  Lab 10/25/21 0042 10/26/21 0048 10/27/21 0102 10/28/21 0038 10/29/21 0032  NA 138   < > 138 138 139  K 5.1   < > 4.8 4.7 4.0  CL 102   < >  103 101 98  CO2 24   < > 29 31 34*  GLUCOSE 129*   < > 154* 150* 180*  BUN 31*   < > 31* 32* 25*  CREATININE 1.94*   < > 1.53* 1.65* 1.40*  CALCIUM 8.9   < > 8.8* 8.3* 8.7*  PROT 7.0  --   --   --   --   ALBUMIN 3.5  --   --   --   --   AST 24  --   --   --   --   ALT 18  --   --   --   --   ALKPHOS 80  --   --   --   --   BILITOT 1.0  --   --   --   --   GFRNONAA 37*   < > 49* 45* 54*  ANIONGAP 12   < > 6 6 7    < > = values in this interval not displayed.     Lipids No results for input(s): "CHOL", "TRIG", "HDL", "LABVLDL", "LDLCALC", "CHOLHDL" in the last 168 hours.  Hematology Recent Labs  Lab 10/25/21 0042 10/26/21 0048  WBC 12.8* 10.3  RBC 4.46 4.08*  HGB 13.7 12.7*  HCT 45.0 40.7  MCV 100.9* 99.8  MCH 30.7 31.1  MCHC 30.4 31.2  RDW 13.2 13.1  PLT 173 160    Thyroid No results for input(s): "TSH",  "FREET4" in the last 168 hours.  BNPNo results for input(s): "BNP", "PROBNP" in the last 168 hours.  DDimer No results for input(s): "DDIMER" in the last 168 hours.   Radiology    DG Chest Port 1 View  Result Date: 10/29/2021 CLINICAL DATA:  Chest tube placement and lobectomy. EXAM: PORTABLE CHEST 1 VIEW COMPARISON:  10/27/2021 FINDINGS: Lungs are hypoinflated with significant stable volume loss of the left lung with elevation of the left hemidiaphragm. Slight worsening opacification over the left mid to lower lung adjacent the elevated hemidiaphragm likely effusion/atelectasis, although infection is possible. Mild linear atelectasis over the right base. Mild stable cardiomegaly. No evidence of pneumothorax. Remainder of the exam is unchanged. IMPRESSION: 1. Slight worsening opacification over the left mid to lower lung adjacent the elevated left hemidiaphragm likely effusion/atelectasis, although infection is possible. Minimal linear atelectasis right base. 2. Stable cardiomegaly. 3. Stable volume loss of the left lung with elevation of the left hemidiaphragm. Electronically Signed   By: Marin Olp M.D.   On: 10/29/2021 08:14   DG CHEST PORT 1 VIEW  Result Date: 10/28/2021 CLINICAL DATA:  Status post lobectomy. EXAM: PORTABLE CHEST 1 VIEW COMPARISON:  One-view chest x-ray 10/27/2021 FINDINGS: Heart is enlarged. Chronic left hemidiaphragm elevation noted. Small left apical pneumothorax remains. Bilateral lower lobe airspace disease is present. The diffuse interstitial pattern has increased slightly. IMPRESSION: 1. Persistent small left apical pneumothorax. 2. Bilateral lower lobe airspace disease remains concerning for infection. 3. Slight increase in diffuse interstitial pattern, likely reflecting edema. Electronically Signed   By: San Morelle M.D.   On: 10/28/2021 08:03    Cardiac Studies      Patient Profile        Assessment & Plan     Atrial fib:   He has postoperative atrial  fibrillation.  His rate is well controlled.  Continue Eliquis and carvedilol.  He will follow-up with Dr. Harl Bowie for further long-term management of his atrial fibrillation.  2.  Leg edema: He may benefit from some additional IV  Lasix.   Will give lasix 40 IV bid for several days.    Anticipate possibly changing back to torsemide tomorrow instead of IV lasix    For questions or updates, please contact [place] Please consult [email or web address] for contact info under        Signed, Mertie Moores, MD  10/29/2021, 10:22 AM

## 2021-10-29 NOTE — Progress Notes (Addendum)
[address]       [place] [address]             [phone]       6 Days Post-Op Procedure(s) (LRB): XI ROBOTIC ASSISTED THORASCOPY-LEFT UPPER LOBECTOMY (Left) INTERCOSTAL NERVE BLOCK NODE DISSECTION  Subjective: Patient walked two times yesterday. He coughed up darkened sputum this am  Objective: Vital signs in last 24 hours: Temp:  [97.6 F (36.4 C)-98.8 F (37.1 C)] 98.8 F (37.1 C) (07/23 0805) Pulse Rate:  [68-82] 72 (07/23 0805) Cardiac Rhythm: Atrial fibrillation (07/23 0753) Resp:  [16-20] 18 (07/23 0643) BP: (115-134)/(75-98) 131/90 (07/23 0805) SpO2:  [96 %-99 %] 99 % (07/23 0805)     Intake/Output from previous day: 07/22 0701 - 07/23 0700 In: 240 [P.O.:240] Out: 1350 [Urine:1350]   Physical Exam:  Cardiovascular: IRRR IRRR Pulmonary: Shallow breath sounds bilaterally Abdomen: Soft, non tender, bowel sounds present. Extremities:Unna boots in place Wounds: Clean and dry.  No erythema or signs of infection.   Lab Results: CBC: No results for input(s): "WBC", "HGB", "HCT", "PLT" in the last 72 hours.  BMET:  Recent Labs    10/28/21 0038 10/29/21 0032  NA 138 139  K 4.7 4.0  CL 101 98  CO2 31 34*  GLUCOSE 150* 180*  BUN 32* 25*  CREATININE 1.65* 1.40*  CALCIUM 8.3* 8.7*     PT/INR: No results for input(s): "LABPROT", "INR" in the last 72 hours. ABG:  INR: Will add last result for INR, ABG once components are confirmed Will add last 4 CBG results once components are confirmed  Assessment/Plan:  1. CV - History of paroxsymal a flutter with CVR. S/p DCCV May. A fib this am. On Coreg 6.25 mg bid and Apixaban 5 mg bid. History of ATAA that will need further surveillance after discharge. 2.  Pulmonary - On 6L of oxygen via HFNC. He has been on alternating HFNC and [place] post op. CXR this am shows elevation of left hemidiaphragm, stable cardiomegaly, atelectasis on left.Encourage incentive spirometer 3. LE edema-now on  IV Lasix bid. Cardiology following. Unna boots inplace 4. DM-CBGs 162/148/162.  Pre op HGA1C 7.6. Will not restart Farxiga at this time. He will need medial follow up after discharge. 5. CKD (stage IIIA) Chronic Kidney Disease   Stage I     GFR >90  Stage II    GFR 60-89  Stage IIIA GFR 45-59  Stage IIIB GFR 30-44  Stage IV   GFR 15-29  Stage V    GFR  <15  Lab Results  Component Value Date   CREATININE 1.40 (H) 10/29/2021   Estimated Creatinine Clearance: 65.7 mL/min (A) (by C-G formula based on SCr of 1.4 mg/dL (H)). Creatinine this am slightly decreased to 1.45. Check in am as on Demadex 6. On Lovenox for DVT prophylaxis 7. Regarding placement, PT does not recommend CIR as ambulating over 400 feet. Will see if a candidate for SNF;if not, home with [place] 10/29/2021,9:08 AM  Patient examined and today's chest x-ray reviewed. He walked 3 or 4 times yesterday.  Definitely getting stronger.  O2 saturation now reads greater than 95% Chest x-ray today shows expected postoperative changes with preoperative chronic elevation of left hemidiaphragm Patient now on 4 L nasal cannula, although he has the green nasal cannula tubing he is not on high flow as discussed with patient's nurse.  Significant leg edema treated by cardiology with IV Lasix.  Unna boot wraps have also been applied. Patient has been accepted for home health care by [facility] should be able to transition to home with home O2 in 1 to 2 days.  patient examined and medical record reviewed,agree with above note. Dahlia Byes 10/29/2021

## 2021-10-30 DIAGNOSIS — N171 Acute kidney failure with acute cortical necrosis: Secondary | ICD-10-CM

## 2021-10-30 DIAGNOSIS — D3A090 Benign carcinoid tumor of the bronchus and lung: Secondary | ICD-10-CM

## 2021-10-30 DIAGNOSIS — N183 Chronic kidney disease, stage 3 unspecified: Secondary | ICD-10-CM

## 2021-10-30 DIAGNOSIS — Z902 Acquired absence of lung [part of]: Secondary | ICD-10-CM | POA: Diagnosis not present

## 2021-10-30 DIAGNOSIS — I5033 Acute on chronic diastolic (congestive) heart failure: Secondary | ICD-10-CM

## 2021-10-30 DIAGNOSIS — I10 Essential (primary) hypertension: Secondary | ICD-10-CM

## 2021-10-30 LAB — GLUCOSE, CAPILLARY
Glucose-Capillary: 186 mg/dL — ABNORMAL HIGH (ref 70–99)
Glucose-Capillary: 184 mg/dL — ABNORMAL HIGH (ref 70–99)
Glucose-Capillary: 144 mg/dL — ABNORMAL HIGH (ref 70–99)
Glucose-Capillary: 246 mg/dL — ABNORMAL HIGH (ref 70–99)

## 2021-10-30 LAB — BASIC METABOLIC PANEL
Anion gap: 7 (ref 5–15)
BUN: 20 mg/dL (ref 8–23)
CO2: 38 mmol/L — ABNORMAL HIGH (ref 22–32)
Calcium: 8.6 mg/dL — ABNORMAL LOW (ref 8.9–10.3)
Chloride: 95 mmol/L — ABNORMAL LOW (ref 98–111)
Creatinine, Ser: 1.43 mg/dL — ABNORMAL HIGH (ref 0.61–1.24)
GFR, Estimated: 53 mL/min — ABNORMAL LOW (ref >60)
Glucose, Bld: 161 mg/dL — ABNORMAL HIGH (ref 70–99)
Potassium: 3.7 mmol/L (ref 3.5–5.1)
Sodium: 140 mmol/L (ref 135–145)

## 2021-10-30 MED ORDER — POTASSIUM CHLORIDE CRYS ER 20 MEQ PO TBCR
40.0000 meq | EXTENDED_RELEASE_TABLET | Freq: Two times a day (BID) | ORAL | Status: DC
  Administered 2021-10-30 (×2): 40 meq via ORAL
  Filled 2021-10-30 (×3): qty 2

## 2021-10-30 MED ORDER — HYDRALAZINE HCL 50 MG PO TABS
50.0000 mg | ORAL_TABLET | Freq: Three times a day (TID) | ORAL | Status: DC
  Administered 2021-10-31 – 2021-11-02 (×7): 50 mg via ORAL
  Filled 2021-10-30 (×7): qty 1

## 2021-10-30 MED ORDER — AMLODIPINE BESYLATE 10 MG PO TABS
10.0000 mg | ORAL_TABLET | Freq: Every day | ORAL | Status: DC
  Administered 2021-10-30: 10 mg via ORAL
  Filled 2021-10-30: qty 1

## 2021-10-30 NOTE — Progress Notes (Addendum)
      [address]       [place] [address]             [phone]      7 Days Post-Op Procedure(s) (LRB): XI ROBOTIC ASSISTED THORASCOPY-LEFT UPPER LOBECTOMY (Left) INTERCOSTAL NERVE BLOCK NODE DISSECTION  Subjective:  Patient doing okay.  He has some itching along his left side.  + ambulation  Objective: Vital signs in last 24 hours: Temp:  [97.6 F (36.4 C)-98.1 F (36.7 C)] 98.1 F (36.7 C) (07/24 0800) Pulse Rate:  [70-84] 78 (07/24 0800) Cardiac Rhythm: Atrial flutter (07/24 0800) Resp:  [20] 20 (07/24 0800) BP: (122-137)/(91-106) 137/99 (07/24 0800) SpO2:  [94 %-100 %] 96 % (07/24 0800)  Intake/Output from previous day: 07/23 0701 - 07/24 0700 In: -  Out: 2400 [Urine:2400]  General appearance: alert, cooperative, and no distress Heart: regular rate and rhythm Lungs: clear to auscultation bilaterally Abdomen: soft, non-tender; bowel sounds normal; no masses,  no organomegaly Extremities: edema mild pitting, una boots in place Wound: clean and dry  Lab Results: No results for input(s): "WBC", "HGB", "HCT", "PLT" in the last 72 hours. BMET:  Recent Labs    10/29/21 0032 10/30/21 0042  NA 139 140  K 4.0 3.7  CL 98 95*  CO2 34* 38*  GLUCOSE 180* 161*  BUN 25* 20  CREATININE 1.40* 1.43*  CALCIUM 8.7* 8.6*    PT/INR: No results for input(s): "LABPROT", "INR" in the last 72 hours. ABG    Component Value Date/Time   PHART 7.38 10/19/2021 1400   HCO3 27.2 10/19/2021 1400   O2SAT 97.4 10/19/2021 1400   CBG (last 3)  Recent Labs    10/29/21 2127 10/29/21 2240 10/30/21 0613  GLUCAP 164* 189* 186*    Assessment/Plan: S/P Procedure(s) (LRB): XI ROBOTIC ASSISTED THORASCOPY-LEFT UPPER LOBECTOMY (Left) INTERCOSTAL NERVE BLOCK NODE DISSECTION  CV- chronic A. Fib, current in rate controlled flutter this morning, remains HTN- continue Coreg, resume home Norvasc, Eliquis Pulm- weaning oxygen as tolerated, requirement has decreased  with diuresis, continue IS Renal- creatinine stable at 1.43, continue IV diuretics, una boots per cardiology, K is at 3.7, will supplement DM- sugars are stable, continue SSIP, will resume farxiga at discharge Dispo- patient stable, weaning oxygen as tolerated, continue diuretics per cardiology recommendations, supplement potassium, repeat BMET In AM   LOS: 7 days   Ellwood Handler, PA-C 10/30/2021  Agree with above IS ambulation Dispo planning  Lyndie Vanderloop O Michiko Lineman

## 2021-10-30 NOTE — Progress Notes (Signed)
Progress Note  Patient Name: Chris Lucas Date of Encounter: 10/30/2021  [facility] Cardiologist: Janina Mayo, MD   Subjective   Feeling well.  No CP.  Breathing improving.  Denies palpitations.   Inpatient Medications    Scheduled Meds:  amLODipine  10 mg Oral Daily   apixaban  5 mg Oral BID   atorvastatin  40 mg Oral Daily   bisacodyl  10 mg Oral Daily   carvedilol  6.25 mg Oral BID   furosemide  40 mg Intravenous BID   insulin aspart  0-24 Units Subcutaneous TID AC & HS   pantoprazole  40 mg Oral Daily   potassium chloride  40 mEq Oral BID   senna-docusate  1 tablet Oral QHS   Continuous Infusions:  PRN Meds: bisacodyl, docusate sodium, hydrALAZINE, ipratropium-albuterol, morphine injection, ondansetron (ZOFRAN) IV, mouth rinse, oxyCODONE, polyethylene glycol, traMADol   Vital Signs    Vitals:   10/29/21 1944 10/30/21 0019 10/30/21 0441 10/30/21 0800  BP: (!) 129/91 (!) 122/95 (!) 136/106 (!) 137/99  Pulse: 75 70 73 78  Resp: 20 20 20 20   Temp: 97.7 F (36.5 C) 97.8 F (36.6 C) 97.6 F (36.4 C) 98.1 F (36.7 C)  TempSrc: Oral Oral Oral Oral  SpO2: 100% 100% 100% 96%  Weight:      Height:        Intake/Output Summary (Last 24 hours) at 10/30/2021 1057 Last data filed at 10/30/2021 0610 Gross per 24 hour  Intake --  Output 2400 ml  Net -2400 ml      10/23/2021    5:53 AM 10/19/2021   12:55 PM 09/29/2021    8:42 AM  Last 3 Weights  Weight (lbs) 250 lb 257 lb 6.4 oz 255 lb  Weight (kg) 113.399 kg 116.756 kg 115.667 kg      Telemetry    Atrial flutter.  Rate <100 bpm. PVCs - Personally Reviewed  ECG    N/a - Personally Reviewed  Physical Exam   VS:  BP (!) 137/99 (BP Location: Right Arm)   Pulse 78   Temp 98.1 F (36.7 C) (Oral)   Resp 20   Ht 6\' 1"  (1.854 m)   Wt 113.4 kg   SpO2 96%   BMI 32.98 kg/m  , BMI Body mass index is 32.98 kg/m. GENERAL:  Well appearing HEENT: Pupils equal round and reactive, fundi not visualized,  oral mucosa unremarkable NECK:  No jugular venous distention, waveform within normal limits, carotid upstroke brisk and symmetric, no bruits, no thyromegaly LUNGS:  Clear to auscultation bilaterally HEART:  Irregularly irregular.  PMI not displaced or sustained,S1 and S2 within normal limits, no S3, no S4, no clicks, no rubs, no murmurs ABD:  Flat, positive bowel sounds normal in frequency in pitch, no bruits, no rebound, no guarding, no midline pulsatile mass, no hepatomegaly, no splenomegaly EXT:  2 plus pulses throughout, no edema, no cyanosis no clubbing SKIN:  No rashes no nodules NEURO:  Cranial nerves II through XII grossly intact, motor grossly intact throughout PSYCH:  Cognitively intact, oriented to person place and time   Labs    High Sensitivity Troponin:  No results for input(s): "TROPONINIHS" in the last 720 hours.   Chemistry Recent Labs  Lab 10/25/21 0042 10/26/21 0048 10/28/21 0038 10/29/21 0032 10/30/21 0042  NA 138   < > 138 139 140  K 5.1   < > 4.7 4.0 3.7  CL 102   < > 101 98  95*  CO2 24   < > 31 34* 38*  GLUCOSE 129*   < > 150* 180* 161*  BUN 31*   < > 32* 25* 20  CREATININE 1.94*   < > 1.65* 1.40* 1.43*  CALCIUM 8.9   < > 8.3* 8.7* 8.6*  PROT 7.0  --   --   --   --   ALBUMIN 3.5  --   --   --   --   AST 24  --   --   --   --   ALT 18  --   --   --   --   ALKPHOS 80  --   --   --   --   BILITOT 1.0  --   --   --   --   GFRNONAA 37*   < > 45* 54* 53*  ANIONGAP 12   < > 6 7 7    < > = values in this interval not displayed.    Lipids No results for input(s): "CHOL", "TRIG", "HDL", "LABVLDL", "LDLCALC", "CHOLHDL" in the last 168 hours.  Hematology Recent Labs  Lab 10/25/21 0042 10/26/21 0048  WBC 12.8* 10.3  RBC 4.46 4.08*  HGB 13.7 12.7*  HCT 45.0 40.7  MCV 100.9* 99.8  MCH 30.7 31.1  MCHC 30.4 31.2  RDW 13.2 13.1  PLT 173 160   Thyroid No results for input(s): "TSH", "FREET4" in the last 168 hours.  BNPNo results for input(s): "BNP",  "PROBNP" in the last 168 hours.  DDimer No results for input(s): "DDIMER" in the last 168 hours.   Radiology    DG Chest Port 1 View  Result Date: 10/29/2021 CLINICAL DATA:  Chest tube placement and lobectomy. EXAM: PORTABLE CHEST 1 VIEW COMPARISON:  10/27/2021 FINDINGS: Lungs are hypoinflated with significant stable volume loss of the left lung with elevation of the left hemidiaphragm. Slight worsening opacification over the left mid to lower lung adjacent the elevated hemidiaphragm likely effusion/atelectasis, although infection is possible. Mild linear atelectasis over the right base. Mild stable cardiomegaly. No evidence of pneumothorax. Remainder of the exam is unchanged. IMPRESSION: 1. Slight worsening opacification over the left mid to lower lung adjacent the elevated left hemidiaphragm likely effusion/atelectasis, although infection is possible. Minimal linear atelectasis right base. 2. Stable cardiomegaly. 3. Stable volume loss of the left lung with elevation of the left hemidiaphragm. Electronically Signed   By: Marin Olp M.D.   On: 10/29/2021 08:14    Cardiac Studies   Echo 07/07/21:  1. Left ventricular ejection fraction, by estimation, is 55 to 60%. The  left ventricle has normal function. The left ventricle has no regional  wall motion abnormalities. There is moderate left ventricular hypertrophy.  Left ventricular diastolic  parameters are indeterminate.   2. Right ventricular systolic function is normal. The right ventricular  size is normal.   3. Left atrial size was moderately dilated.   4. The mitral valve is normal in structure. No evidence of mitral valve  regurgitation. No evidence of mitral stenosis.   5. The aortic valve is tricuspid. There is mild calcification of the  aortic valve. There is mild thickening of the aortic valve. Aortic valve  regurgitation is not visualized. No aortic stenosis is present.   6. Aortic dilatation noted. There is mild dilatation of  the ascending  aorta, measuring 39 mm.   7. The inferior vena cava is normal in size with greater than 50%  respiratory variability, suggesting right  atrial pressure of 3 mmHg.   Patient Profile     71 y.o. male with HFpEF, paroxysmal atrial flutter, hypertension, CKD 3A, moderate ascending aortic aneurysm, diabetes, hyperlipidemia, and prior stroke admitted for lung resection for carcinoid tumor.  Cardiology consulted for atrial flutter with RVR and lower extremity edema.  Assessment & Plan    #Paroxysmal atrial flutter: Patient had successful cardioversion 08/2021.  He notes that he was feeling well after his cardioversion.  He is unsure of exactly when he went into atrial flutter and whether he felt any different.  He is now in atrial flutter with well-controlled rates.  Continue Eliquis and carvedilol.  Consider outpatient cardioversion.  # Acute on chronic diastolic heart failure:  # Hypertension: Increasing LE edema.  Improving with Unna boots and diuresis.  His home amlodipine was held as it was thought to be contributing to his lower extremity edema.  It was started today, but we will again discontinue it.  Plan to start hydralazine tomorrow.  If renal function is stable after diuresis, will consider Entresto for both heart failure and renal protection.  Renal function is stable.  Continue IV Lasix today and consider transition to oral tomorrow.  He notes that he has difficulty swallowing pills.  We will need to make sure that he take something that can be crushed.   # Acute on chronic CKD 3a: Diuresis as above.  Continue Farxiga at discharge.   # Carcinoid tumor: s/p lung resection.  # CVA:  # Hyperlipidemia:  Continue Eliquis and atorvastatin.  #Moderate aortic aneurysm: Repeat imaging as an outpatient.  Ascending aorta was 4.8 cm on his chest CT 06/2021.       For questions or updates, please contact [place] Please consult [email or web address] for contact info under         Signed, [name], MD  10/30/2021, 10:57 AM

## 2021-10-30 NOTE — Progress Notes (Signed)
Ortho tech. made aware about  unna boots to be changed today, claimed to be coming. Old unna boots removed  and bil. Legs cleansed.

## 2021-10-30 NOTE — Progress Notes (Signed)
Mobility Specialist Progress Note    10/30/21 1159  Mobility  Activity Ambulated with assistance in hallway  Level of Assistance Contact guard assist, steadying assist  Assistive Device Four wheel walker  Distance Ambulated (ft) 1200 ft  Activity Response Tolerated well  $Mobility charge 1 Mobility   Pre-Mobility: 81 HR, 127/92 BP, 97% SpO2 During Mobility: 83 HR, 88-92% on 3LO2 SpO2 Post-Mobility: 84 HR, 96% SpO2  Pt received in bed and agreeable. No complaints on walk. Encouraged pursed lip breathing. Returned to sitting EOB with call bell in reach.    Hildred Alamin Mobility Specialist

## 2021-10-30 NOTE — Progress Notes (Signed)
Orthopedic Tech Progress Note Patient Details:  Chris Lucas February 24, 1951 [id number]  Ortho Devices Type of Ortho Device: Haematologist Ortho Device/Splint Location: BLE Ortho Device/Splint Interventions: Ordered, Application, Adjustment   Post Interventions Patient Tolerated: Well Instructions Provided: Care of device  Janit Pagan 10/30/2021, 5:02 PM

## 2021-10-30 NOTE — Plan of Care (Signed)

## 2021-10-30 NOTE — Progress Notes (Signed)
Physical Therapy Treatment Patient Details Name: Chris Lucas MRN: [id number] DOB: April 24, 1950 Today's Date: 10/30/2021   History of Present Illness 71 yo male admitted 7/17 for elective LUL resection due to CA. PMhx: CVA, Aflutter, thoracic aortic aneurysm, HTN, HLD, DM, bil LE edema    PT Comments    Pt seated edge of bed this session.  He required 2L [place] and SpO2 85%-94%, improved with cues for pursed lip breathing.  He progressed to stair training this session with good tolerance.  Plan for HHPT remains appropriate.      Recommendations for follow up therapy are one component of a multi-disciplinary discharge planning process, led by the attending physician.  Recommendations may be updated based on patient status, additional functional criteria and insurance authorization.  Follow Up Recommendations  Home health PT     Assistance Recommended at Discharge    Patient can return home with the following Help with stairs or ramp for entrance;Assist for transportation;Assistance with cooking/housework;A little help with bathing/dressing/bathroom;A little help with walking and/or transfers   Equipment Recommendations       Recommendations for Other Services       Precautions / Restrictions Precautions Precautions: Fall Precaution Comments: watch sats Restrictions Weight Bearing Restrictions: No     Mobility  Bed Mobility               General bed mobility comments: sitting on EOB    Transfers Overall transfer level: Needs assistance Equipment used: Rolling walker (2 wheels) Transfers: Sit to/from Stand Sit to Stand: Supervision           General transfer comment: cues for safety and hand placement.  Cues to lock rollator brakes for safety.    Ambulation/Gait Ambulation/Gait assistance: Supervision Gait Distance (Feet): 1000 Feet Assistive device: Rolling walker (2 wheels) Gait Pattern/deviations: Step-through pattern, Decreased stride length, Trunk  flexed       General Gait Details: Cues for upper trunk control to improve posture.   Stairs Stairs: Yes Stairs assistance: Supervision Stair Management: One rail Right Number of Stairs: 12 General stair comments: Cues for sequencing and safety.  Pt slow and guarded but tolerated well.   Wheelchair Mobility    Modified Rankin (Stroke Patients Only)       Balance Overall balance assessment: Needs assistance Sitting-balance support: Feet supported Sitting balance-Leahy Scale: Good     Standing balance support: Bilateral upper extremity supported Standing balance-Leahy Scale: Fair Standing balance comment: pt able to static stand without UE support, reaching for support with walking                            Cognition Arousal/Alertness: Awake/alert Behavior During Therapy: Flat affect Overall Cognitive Status: Within Functional Limits for tasks assessed                                          Exercises      General Comments        Pertinent Vitals/Pain Pain Assessment Pain Assessment: Faces Faces Pain Scale: Hurts a little bit Pain Location: incision Pain Descriptors / Indicators: Tender, Sore Pain Intervention(s): Monitored during session, Repositioned    Home Living                          Prior Function  PT Goals (current goals can now be found in the care plan section) Acute Rehab PT Goals Patient Stated Goal: return home Potential to Achieve Goals: Fair Progress towards PT goals: Progressing toward goals    Frequency    Min 3X/week      PT Plan Current plan remains appropriate    Co-evaluation              AM-PAC PT "6 Clicks" Mobility   Outcome Measure  Help needed turning from your back to your side while in a flat bed without using bedrails?: None Help needed moving from lying on your back to sitting on the side of a flat bed without using bedrails?: A Little Help needed  moving to and from a bed to a chair (including a wheelchair)?: A Little Help needed standing up from a chair using your arms (e.g., wheelchair or bedside chair)?: A Little Help needed to walk in hospital room?: A Little Help needed climbing 3-5 steps with a railing? : A Little 6 Click Score: 19    End of Session Equipment Utilized During Treatment: Oxygen Activity Tolerance: Patient tolerated treatment well Patient left: with call bell/phone within reach;in bed (sitting edge of bed.) Nurse Communication: Mobility status PT Visit Diagnosis: Other abnormalities of gait and mobility (R26.89);Muscle weakness (generalized) (M62.81)     Time: 7829-5621 PT Time Calculation (min) (ACUTE ONLY): 24 min  Charges:  $Gait Training: 8-22 mins $Therapeutic Activity: 8-22 mins                     Chris Lucas , PTA Acute Rehabilitation Services Office [phone]    Cristela Blue 10/30/2021, 4:34 PM

## 2021-10-31 DIAGNOSIS — Z902 Acquired absence of lung [part of]: Secondary | ICD-10-CM | POA: Diagnosis not present

## 2021-10-31 DIAGNOSIS — I7121 Aneurysm of the ascending aorta, without rupture: Secondary | ICD-10-CM | POA: Diagnosis not present

## 2021-10-31 DIAGNOSIS — I5033 Acute on chronic diastolic (congestive) heart failure: Secondary | ICD-10-CM | POA: Diagnosis not present

## 2021-10-31 LAB — BASIC METABOLIC PANEL
Anion gap: 9 (ref 5–15)
BUN: 22 mg/dL (ref 8–23)
CO2: 39 mmol/L — ABNORMAL HIGH (ref 22–32)
Calcium: 8.9 mg/dL (ref 8.9–10.3)
Chloride: 91 mmol/L — ABNORMAL LOW (ref 98–111)
Creatinine, Ser: 1.37 mg/dL — ABNORMAL HIGH (ref 0.61–1.24)
GFR, Estimated: 56 mL/min — ABNORMAL LOW (ref >60)
Glucose, Bld: 164 mg/dL — ABNORMAL HIGH (ref 70–99)
Potassium: 3.8 mmol/L (ref 3.5–5.1)
Sodium: 139 mmol/L (ref 135–145)

## 2021-10-31 LAB — GLUCOSE, CAPILLARY
Glucose-Capillary: 201 mg/dL — ABNORMAL HIGH (ref 70–99)
Glucose-Capillary: 247 mg/dL — ABNORMAL HIGH (ref 70–99)
Glucose-Capillary: 172 mg/dL — ABNORMAL HIGH (ref 70–99)
Glucose-Capillary: 202 mg/dL — ABNORMAL HIGH (ref 70–99)

## 2021-10-31 MED ORDER — FUROSEMIDE 10 MG/ML IJ SOLN
60.0000 mg | Freq: Two times a day (BID) | INTRAMUSCULAR | Status: DC
  Administered 2021-10-31: 60 mg via INTRAVENOUS
  Filled 2021-10-31: qty 6

## 2021-10-31 MED ORDER — POTASSIUM CHLORIDE 20 MEQ PO PACK
40.0000 meq | PACK | Freq: Two times a day (BID) | ORAL | Status: DC
Start: 2021-10-31 — End: 2021-11-02
  Administered 2021-10-31 – 2021-11-01 (×4): 40 meq via ORAL
  Filled 2021-10-31 (×4): qty 2

## 2021-10-31 NOTE — Progress Notes (Signed)
Mobility Specialist Progress Note:    10/31/21 1449  Mobility  Activity Ambulated with assistance in hallway  Level of Assistance Standby assist, set-up cues, supervision of patient - no hands on  Assistive Device Four wheel walker  Distance Ambulated (ft) 800 ft  Activity Response Tolerated well  $Mobility charge 1 Mobility    Pre-mobility: 81 HR, 96% SpO2 During mobility: 89 HR, 90% SpO2 Post-mobility: 81 HR, 91% SpO2  Pt sitting EOB and agreeable. No complaints. On 2LO2. Pt left sitting EOB with all needs met.   Marty Uy Mobility Specialist-Acute Rehab Secure Chat only

## 2021-10-31 NOTE — Progress Notes (Signed)
      [address]       [place] [address]             [phone]      8 Days Post-Op Procedure(s) (LRB): XI ROBOTIC ASSISTED THORASCOPY-LEFT UPPER LOBECTOMY (Left) INTERCOSTAL NERVE BLOCK NODE DISSECTION Subjective: Conts to feel better overall  Objective: Vital signs in last 24 hours: Temp:  [98.1 F (36.7 C)-98.6 F (37 C)] 98.3 F (36.8 C) (07/25 0829) Pulse Rate:  [78-84] 83 (07/25 0829) Cardiac Rhythm: Atrial flutter (07/25 0744) Resp:  [20] 20 (07/25 0626) BP: (110-135)/(76-93) 118/81 (07/25 0829) SpO2:  [96 %-100 %] 96 % (07/25 0829)  Hemodynamic parameters for last 24 hours:    Intake/Output from previous day: 07/24 0701 - 07/25 0700 In: 450 [P.O.:450] Out: 2025 [Urine:2025] Intake/Output this shift: Total I/O In: 240 [P.O.:240] Out: 1000 [Urine:1000]  General appearance: alert, cooperative, and no distress Heart: irregularly irregular rhythm Lungs: clear to auscultation bilaterally Abdomen: benign Extremities: edema conts to improve, Unna boots in place Wound: healing well  Lab Results: No results for input(s): "WBC", "HGB", "HCT", "PLT" in the last 72 hours. BMET:  Recent Labs    10/30/21 0042 10/31/21 0031  NA 140 139  K 3.7 3.8  CL 95* 91*  CO2 38* 39*  GLUCOSE 161* 164*  BUN 20 22  CREATININE 1.43* 1.37*  CALCIUM 8.6* 8.9    PT/INR: No results for input(s): "LABPROT", "INR" in the last 72 hours. ABG    Component Value Date/Time   PHART 7.38 10/19/2021 1400   HCO3 27.2 10/19/2021 1400   O2SAT 97.4 10/19/2021 1400   CBG (last 3)  Recent Labs    10/30/21 1637 10/30/21 2119 10/31/21 0630  GLUCAP 144* 246* 201*    Meds Scheduled Meds:  apixaban  5 mg Oral BID   atorvastatin  40 mg Oral Daily   bisacodyl  10 mg Oral Daily   carvedilol  6.25 mg Oral BID   furosemide  40 mg Intravenous BID   hydrALAZINE  50 mg Oral Q8H   insulin aspart  0-24 Units Subcutaneous TID AC & HS   pantoprazole  40 mg Oral Daily    potassium chloride  40 mEq Oral BID   senna-docusate  1 tablet Oral QHS   Continuous Infusions: PRN Meds:.bisacodyl, docusate sodium, hydrALAZINE, ipratropium-albuterol, morphine injection, ondansetron (ZOFRAN) IV, mouth rinse, oxyCODONE, polyethylene glycol, traMADol  Xrays No results found.  Assessment/Plan: S/P Procedure(s) (LRB): XI ROBOTIC ASSISTED THORASCOPY-LEFT UPPER LOBECTOMY (Left) INTERCOSTAL NERVE BLOCK NODE DISSECTION  1 afeb, VSS, aflutter 2 sats ok on 2 liters HFNC 3 excellent diuresis yesterday 4 creat improved 1.37 5 bs control fair- starting farxiga at d/c 6 appreciate cardiology assistance with diastolic HF/HTN/diuresis/rhythm issues management- stteady overall improvement 7 home w/HH when medically stable from cardiology perspective   LOS: 8 days    John Giovanni PA-C Pager [phone] 10/31/2021  Agree with above Surgically ready for discharge, but awaiting medical optimization  Harrell O [place]

## 2021-10-31 NOTE — Progress Notes (Signed)
Progress Note  Patient Name: Chris Lucas Date of Encounter: 10/31/2021  [facility] Cardiologist: Janina Mayo, MD   Subjective   Feeling well.  No CP.  Breathing improving.  Denies palpitations.   Inpatient Medications    Scheduled Meds:  apixaban  5 mg Oral BID   atorvastatin  40 mg Oral Daily   bisacodyl  10 mg Oral Daily   carvedilol  6.25 mg Oral BID   furosemide  40 mg Intravenous BID   hydrALAZINE  50 mg Oral Q8H   insulin aspart  0-24 Units Subcutaneous TID AC & HS   pantoprazole  40 mg Oral Daily   potassium chloride  40 mEq Oral BID   senna-docusate  1 tablet Oral QHS   Continuous Infusions:  PRN Meds: bisacodyl, docusate sodium, hydrALAZINE, ipratropium-albuterol, morphine injection, ondansetron (ZOFRAN) IV, mouth rinse, oxyCODONE, polyethylene glycol, traMADol   Vital Signs    Vitals:   10/30/21 0800 10/30/21 1943 10/30/21 2300 10/31/21 0626  BP: (!) 137/99 (!) 127/93 135/85 110/76  Pulse: 78 84 78 81  Resp: 20 20 20 20   Temp: 98.1 F (36.7 C) 98.3 F (36.8 C) 98.6 F (37 C) 98.1 F (36.7 C)  TempSrc: Oral Oral Oral Oral  SpO2: 96% 100% 97% 97%  Weight:      Height:        Intake/Output Summary (Last 24 hours) at 10/31/2021 0824 Last data filed at 10/30/2021 1946 Gross per 24 hour  Intake 450 ml  Output 2025 ml  Net -1575 ml      10/23/2021    5:53 AM 10/19/2021   12:55 PM 09/29/2021    8:42 AM  Last 3 Weights  Weight (lbs) 250 lb 257 lb 6.4 oz 255 lb  Weight (kg) 113.399 kg 116.756 kg 115.667 kg      Telemetry    Atrial flutter.  Rate <100 bpm. PVCs - Personally Reviewed  ECG    N/a - Personally Reviewed  Physical Exam   VS:  BP 110/76 (BP Location: Right Arm)   Pulse 81   Temp 98.1 F (36.7 C) (Oral)   Resp 20   Ht 6\' 1"  (1.854 m)   Wt 113.4 kg   SpO2 97%   BMI 32.98 kg/m  , BMI Body mass index is 32.98 kg/m. GENERAL:  Well appearing HEENT: Pupils equal round and reactive, fundi not visualized, oral mucosa  unremarkable NECK: JVP 2 cm above clavicle at 45 degrees.  Waveform within normal limits, carotid upstroke brisk and symmetric, no bruits, no thyromegaly LUNGS:  Clear to auscultation bilaterally HEART:  Irregularly irregular.  PMI not displaced or sustained,S1 and S2 within normal limits, no S3, no S4, no clicks, no rubs, no murmurs ABD:  Flat, positive bowel sounds normal in frequency in pitch, no bruits, no rebound, no guarding, no midline pulsatile mass, no hepatomegaly, no splenomegaly EXT: Legs wrapped in [place] boots. + Lower extremity SKIN:  No rashes no nodules NEURO:  Cranial nerves II through XII grossly intact, motor grossly intact throughout PSYCH:  Cognitively intact, oriented to person place and time   Labs    High Sensitivity Troponin:  No results for input(s): "TROPONINIHS" in the last 720 hours.   Chemistry Recent Labs  Lab 10/25/21 0042 10/26/21 0048 10/29/21 0032 10/30/21 0042 10/31/21 0031  NA 138   < > 139 140 139  K 5.1   < > 4.0 3.7 3.8  CL 102   < > 98 95* 91*  CO2 24   < > 34* 38* 39*  GLUCOSE 129*   < > 180* 161* 164*  BUN 31*   < > 25* 20 22  CREATININE 1.94*   < > 1.40* 1.43* 1.37*  CALCIUM 8.9   < > 8.7* 8.6* 8.9  PROT 7.0  --   --   --   --   ALBUMIN 3.5  --   --   --   --   AST 24  --   --   --   --   ALT 18  --   --   --   --   ALKPHOS 80  --   --   --   --   BILITOT 1.0  --   --   --   --   GFRNONAA 37*   < > 54* 53* 56*  ANIONGAP 12   < > 7 7 9    < > = values in this interval not displayed.    Lipids No results for input(s): "CHOL", "TRIG", "HDL", "LABVLDL", "LDLCALC", "CHOLHDL" in the last 168 hours.  Hematology Recent Labs  Lab 10/25/21 0042 10/26/21 0048  WBC 12.8* 10.3  RBC 4.46 4.08*  HGB 13.7 12.7*  HCT 45.0 40.7  MCV 100.9* 99.8  MCH 30.7 31.1  MCHC 30.4 31.2  RDW 13.2 13.1  PLT 173 160   Thyroid No results for input(s): "TSH", "FREET4" in the last 168 hours.  BNPNo results for input(s): "BNP", "PROBNP" in the last 168  hours.  DDimer No results for input(s): "DDIMER" in the last 168 hours.   Radiology    No results found.  Cardiac Studies   Echo 07/07/21:  1. Left ventricular ejection fraction, by estimation, is 55 to 60%. The  left ventricle has normal function. The left ventricle has no regional  wall motion abnormalities. There is moderate left ventricular hypertrophy.  Left ventricular diastolic  parameters are indeterminate.   2. Right ventricular systolic function is normal. The right ventricular  size is normal.   3. Left atrial size was moderately dilated.   4. The mitral valve is normal in structure. No evidence of mitral valve  regurgitation. No evidence of mitral stenosis.   5. The aortic valve is tricuspid. There is mild calcification of the  aortic valve. There is mild thickening of the aortic valve. Aortic valve  regurgitation is not visualized. No aortic stenosis is present.   6. Aortic dilatation noted. There is mild dilatation of the ascending  aorta, measuring 39 mm.   7. The inferior vena cava is normal in size with greater than 50%  respiratory variability, suggesting right atrial pressure of 3 mmHg.   Patient Profile     71 y.o. male with HFpEF, paroxysmal atrial flutter, hypertension, CKD 3A, moderate ascending aortic aneurysm, diabetes, hyperlipidemia, and prior stroke admitted for lung resection for carcinoid tumor.  Cardiology consulted for atrial flutter with RVR and lower extremity edema.  Assessment & Plan    #Paroxysmal atrial flutter: Patient had successful cardioversion 08/2021.  He notes that he was feeling well after his cardioversion.  He is unsure of exactly when he went into atrial flutter and whether he felt any different.  He is now in atrial flutter with well-controlled rates.  Continue Eliquis and carvedilol.  Consider outpatient cardioversion.  # Acute on chronic diastolic heart failure:  # Hypertension: He was net -1.5L yesterday.  Renal function  continues to improve and he still remains volume overloaded.increase Lasix  to 60 mg.  Amlodipine held due to concern it was contributing to edema. Continue carvedilol  and hydralazine.  Plan to resume irbesartan or start Entresto prior to discharge as renal function continues to improve.     # Acute on chronic CKD 3a: Diuresis as above.  Renal function continues to improve.  Continue Farxiga at discharge.   # Carcinoid tumor: s/p lung resection.  # CVA:  # Hyperlipidemia:  Continue Eliquis and atorvastatin.  # Moderate aortic aneurysm: Repeat imaging as an outpatient.  Ascending aorta was 4.8 cm on his chest CT 06/2021.  Blood pressure control and beta-blocker as above.     For questions or updates, please contact [place] Please consult [email or web address] for contact info under        Signed, [name], MD  10/31/2021, 8:25 AM

## 2021-10-31 NOTE — Progress Notes (Signed)
Physical Therapy Treatment Patient Details Name: Chris Lucas MRN: [id number] DOB: [birth year] Today's Date: 10/31/2021   History of Present Illness 71 yo male admitted 7/17 for elective LUL resection due to CA. PMhx: CVA, Aflutter, thoracic aortic aneurysm, HTN, HLD, DM, bil LE edema    PT Comments    Pt received supine and agreeable to session, with continued progress. Pt maintaining SpO2 90-94% with 2L O2 throughout ambulation, with light cues for posture and pacing. Increased time spent education and discussing activity recommendations, importance of continued mobility, monitoring O2 sats at home and increasing activity tolerance slowly, with pt verbalizing understanding. Pt continues to benefit from skilled PT services to progress toward functional mobility goals.    Recommendations for follow up therapy are one component of a multi-disciplinary discharge planning process, led by the attending physician.  Recommendations may be updated based on patient status, additional functional criteria and insurance authorization.  Follow Up Recommendations  Home health PT     Assistance Recommended at Discharge Intermittent Supervision/Assistance  Patient can return home with the following Help with stairs or ramp for entrance;Assist for transportation;Assistance with cooking/housework;A little help with bathing/dressing/bathroom;A little help with walking and/or transfers   Equipment Recommendations  Rollator (4 wheels)    Recommendations for Other Services       Precautions / Restrictions Precautions Precautions: Fall Precaution Comments: watch sats Restrictions Weight Bearing Restrictions: No     Mobility  Bed Mobility Overal bed mobility: Modified Independent             General bed mobility comments: increased time and use of bedrails    Transfers Overall transfer level: Needs assistance Equipment used: Rollator (4 wheels) Transfers: Sit to/from Stand Sit to  Stand: Supervision           General transfer comment: cues for safety and hand placement.  Cues to lock rollator brakes for safety.    Ambulation/Gait Ambulation/Gait assistance: Supervision Gait Distance (Feet): 1000 Feet Assistive device: Rollator (4 wheels) Gait Pattern/deviations: Step-through pattern, Decreased stride length, Trunk flexed       General Gait Details: Cues for upper trunk control to improve posture.   Stairs             Wheelchair Mobility    Modified Rankin (Stroke Patients Only)       Balance Overall balance assessment: Needs assistance Sitting-balance support: Feet supported Sitting balance-Leahy Scale: Good     Standing balance support: Bilateral upper extremity supported Standing balance-Leahy Scale: Fair Standing balance comment: pt able to static stand without UE support, reaching for support with walking                            Cognition Arousal/Alertness: Awake/alert Behavior During Therapy: Flat affect Overall Cognitive Status: Within Functional Limits for tasks assessed                                          Exercises      General Comments General comments (skin integrity, edema, etc.): increased time spent discussing activity recomendations with pt and spouse, answered all pt questions, encourage pt to monitor O2 sats at home      Pertinent Vitals/Pain Pain Assessment Pain Assessment: Faces Faces Pain Scale: Hurts a little bit Pain Location: incision Pain Descriptors / Indicators: Tender, Sore Pain Intervention(s): Monitored during session  Home Living                          Prior Function            PT Goals (current goals can now be found in the care plan section) Acute Rehab PT Goals Patient Stated Goal: return home PT Goal Formulation: With patient Time For Goal Achievement: 11/09/21    Frequency    Min 3X/week      PT Plan Current plan remains  appropriate    Co-evaluation              AM-PAC PT "6 Clicks" Mobility   Outcome Measure  Help needed turning from your back to your side while in a flat bed without using bedrails?: None Help needed moving from lying on your back to sitting on the side of a flat bed without using bedrails?: A Little Help needed moving to and from a bed to a chair (including a wheelchair)?: A Little Help needed standing up from a chair using your arms (e.g., wheelchair or bedside chair)?: A Little Help needed to walk in hospital room?: A Little Help needed climbing 3-5 steps with a railing? : A Little 6 Click Score: 19    End of Session Equipment Utilized During Treatment: Oxygen Activity Tolerance: Patient tolerated treatment well Patient left: with call bell/phone within reach;in bed (sitting edge of bed.) Nurse Communication: Mobility status PT Visit Diagnosis: Other abnormalities of gait and mobility (R26.89);Muscle weakness (generalized) (M62.81)     Time: 8315-1761 PT Time Calculation (min) (ACUTE ONLY): 31 min  Charges:  $Therapeutic Exercise: 8-22 mins $Therapeutic Activity: 8-22 mins                     Lamaria Hildebrandt R. PTA Acute Rehabilitation Services Office: [place] 10/31/2021, 2:32 PM

## 2021-10-31 NOTE — TOC Progression Note (Signed)
Transition of Care [facility]) - Progression Note    Patient Details  Name: Chris Lucas MRN: [id number] Date of Birth: [birth year]  Transition of Care [facility]) CM/SW [place], RN Phone Number:[phone]  10/31/2021, 3:15 PM  Clinical Narrative:    TOC continues to follow. No new needs. HH /DME previously set up and delivered. No needs identifies at this time.         Expected Discharge Plan and Services                           DME Arranged: Oxygen, Walker rolling with seat DME Agency: [employer] Date DME Agency Contacted: 10/27/21 Time DME Agency Contacted: [phone] Representative spoke with at DME Agency: Melene Muller HH Arranged: PT, RN [facility] Agency: [facility] [place]         Social Determinants of Health ([place]) Interventions    Readmission Risk Interventions    10/27/2021    9:39 AM  Readmission Risk Prevention Plan  Transportation Screening Complete  PCP or Specialist Appt within 5-7 Days Complete  Home Care Screening Complete  Medication Review (RN CM) Referral to Pharmacy

## 2021-11-01 DIAGNOSIS — I5033 Acute on chronic diastolic (congestive) heart failure: Secondary | ICD-10-CM | POA: Diagnosis not present

## 2021-11-01 DIAGNOSIS — N171 Acute kidney failure with acute cortical necrosis: Secondary | ICD-10-CM | POA: Diagnosis not present

## 2021-11-01 DIAGNOSIS — N1832 Chronic kidney disease, stage 3b: Secondary | ICD-10-CM

## 2021-11-01 DIAGNOSIS — D3A090 Benign carcinoid tumor of the bronchus and lung: Secondary | ICD-10-CM | POA: Diagnosis not present

## 2021-11-01 DIAGNOSIS — Z902 Acquired absence of lung [part of]: Secondary | ICD-10-CM | POA: Diagnosis not present

## 2021-11-01 LAB — BASIC METABOLIC PANEL
Anion gap: 8 (ref 5–15)
BUN: 21 mg/dL (ref 8–23)
CO2: 37 mmol/L — ABNORMAL HIGH (ref 22–32)
Calcium: 8.7 mg/dL — ABNORMAL LOW (ref 8.9–10.3)
Chloride: 91 mmol/L — ABNORMAL LOW (ref 98–111)
Creatinine, Ser: 1.47 mg/dL — ABNORMAL HIGH (ref 0.61–1.24)
GFR, Estimated: 51 mL/min — ABNORMAL LOW (ref >60)
Glucose, Bld: 260 mg/dL — ABNORMAL HIGH (ref 70–99)
Potassium: 4.4 mmol/L (ref 3.5–5.1)
Sodium: 136 mmol/L (ref 135–145)

## 2021-11-01 LAB — GLUCOSE, CAPILLARY
Glucose-Capillary: 177 mg/dL — ABNORMAL HIGH (ref 70–99)
Glucose-Capillary: 174 mg/dL — ABNORMAL HIGH (ref 70–99)
Glucose-Capillary: 218 mg/dL — ABNORMAL HIGH (ref 70–99)
Glucose-Capillary: 152 mg/dL — ABNORMAL HIGH (ref 70–99)

## 2021-11-01 MED ORDER — FLUTICASONE PROPIONATE 50 MCG/ACT NA SUSP
2.0000 | Freq: Every day | NASAL | Status: DC
  Filled 2021-11-01: qty 16

## 2021-11-01 MED ORDER — FUROSEMIDE 40 MG PO TABS
40.0000 mg | ORAL_TABLET | Freq: Every day | ORAL | Status: DC
  Administered 2021-11-02 – 2021-11-03 (×2): 40 mg via ORAL
  Filled 2021-11-01 (×2): qty 1

## 2021-11-01 MED ORDER — LORATADINE 10 MG PO TABS
10.0000 mg | ORAL_TABLET | Freq: Every day | ORAL | Status: DC
  Administered 2021-11-03: 10 mg via ORAL
  Filled 2021-11-01 (×3): qty 1

## 2021-11-01 NOTE — Progress Notes (Signed)
Occupational Therapy Treatment Patient Details Name: Chris Lucas MRN: [id number] DOB: [birth year] Today's Date: 11/01/2021   History of present illness 71 yo male admitted 7/17 for elective LUL resection due to CA. PMhx: CVA, Aflutter, thoracic aortic aneurysm, HTN, HLD, DM, bil LE edema   OT comments  Pt progressing towards OT goals this session. Demonstrated LB dressing and sink level grooming, and then 2 laps around unit. Today he maintained SpO2 >90% on 1L via [place]. Initially on 2L maintaining >93%. Pt also provided energy conservation handout and education. Educated on 3 in 1 as shower chair. Wife present for education and pleasant. Pt loves cooking and asking many questions about sugar in foods and so OT sent Epic chat message to attending MD about potential referral to dietician/nutrition (as his discretion). OT will continue to follow acutely but do not anticipate need for post-acute OT at this time.    Recommendations for follow up therapy are one component of a multi-disciplinary discharge planning process, led by the attending physician.  Recommendations may be updated based on patient status, additional functional criteria and insurance authorization.    Follow Up Recommendations  No OT follow up    Assistance Recommended at Discharge Set up Supervision/Assistance  Patient can return home with the following  Assist for transportation;Assistance with cooking/housework   Equipment Recommendations  Tub/shower seat (3 in 1 OK)    Recommendations for Other Services      Precautions / Restrictions Precautions Precautions: Fall Precaution Comments: watch sats Restrictions Weight Bearing Restrictions: No       Mobility Bed Mobility Overal bed mobility: Modified Independent             General bed mobility comments: increased time and use of bedrails    Transfers Overall transfer level: Needs assistance Equipment used: Rollator (4 wheels) Transfers: Sit to/from  Stand Sit to Stand: Supervision           General transfer comment: cues for safety and hand placement.  Cues to lock rollator brakes for safety.     Balance Overall balance assessment: Needs assistance Sitting-balance support: Feet supported Sitting balance-Leahy Scale: Good     Standing balance support: Bilateral upper extremity supported Standing balance-Leahy Scale: Fair Standing balance comment: pt able to static stand without UE support, reaching for support with walking                           ADL either performed or assessed with clinical judgement   ADL Overall ADL's : Needs assistance/impaired     Grooming: Oral care;Wash/dry face;Wash/dry hands;Supervision/safety;Standing               Lower Body Dressing: Supervision/safety;Sit to/from stand Lower Body Dressing Details (indicate cue type and reason): socks and pants Toilet Transfer: Supervision/safety;Regular Toilet;Rollator (4 wheels) Toilet Transfer Details (indicate cue type and reason): simulated         Functional mobility during ADLs: Supervision/safety;Rollator (4 wheels) General ADL Comments: edcuated on energy conservation as well as provided handout    Extremity/Trunk Assessment Upper Extremity Assessment Upper Extremity Assessment: Overall WFL for tasks assessed   Lower Extremity Assessment Lower Extremity Assessment: Defer to PT evaluation        Vision       Perception     Praxis      Cognition Arousal/Alertness: Awake/alert Behavior During Therapy: WFL for tasks assessed/performed Overall Cognitive Status: Within Functional Limits for tasks assessed  Exercises      Shoulder Instructions       General Comments wife present throughout session. Asking lots of dietary questions - messaged attending MD about referral to nutrition/dietician?    Pertinent Vitals/ Pain       Pain Assessment Pain  Assessment: No/denies pain Pain Intervention(s): Monitored during session, Repositioned  Home Living                                          Prior Functioning/Environment              Frequency  Min 2X/week        Progress Toward Goals  OT Goals(current goals can now be found in the care plan section)  Progress towards OT goals: Progressing toward goals  Acute Rehab OT Goals Patient Stated Goal: get home and get back to cooking OT Goal Formulation: With patient Time For Goal Achievement: 11/10/21 Potential to Achieve Goals: Good  Plan Discharge plan remains appropriate;Frequency remains appropriate    Co-evaluation                 AM-PAC OT "6 Clicks" Daily Activity     Outcome Measure   Help from another person eating meals?: None Help from another person taking care of personal grooming?: A Little Help from another person toileting, which includes using toliet, bedpan, or urinal?: A Little Help from another person bathing (including washing, rinsing, drying)?: A Little Help from another person to put on and taking off regular upper body clothing?: A Little Help from another person to put on and taking off regular lower body clothing?: A Little 6 Click Score: 19    End of Session Equipment Utilized During Treatment: Rollator (4 wheels);Oxygen  OT Visit Diagnosis: Muscle weakness (generalized) (M62.81)   Activity Tolerance Patient tolerated treatment well   Patient Left in bed;with call bell/phone within reach;with family/visitor present   Nurse Communication Mobility status        Time: 9892-1194 OT Time Calculation (min): 42 min  Charges: OT General Charges $OT Visit: 1 Visit OT Treatments $Self Care/Home Management : 8-22 mins $Therapeutic Activity: 23-37 mins  Jesse Sans OTR/L Acute Rehabilitation Services Office: [place] 11/01/2021, 12:03 PM

## 2021-11-01 NOTE — Progress Notes (Signed)
Physical Therapy Treatment Patient Details Name: Chris Lucas MRN: [id number] DOB: 08/02/1950 Today's Date: 11/01/2021   History of Present Illness 71 yo male admitted 7/17 for elective LUL resection due to CA. PMhx: CVA, Aflutter, thoracic aortic aneurysm, HTN, HLD, DM, bil LE edema    PT Comments    Pt with continued progress this session with focus on continued increase in endurance while maintaining adequate O2 saturation. Pt demonstrating ambulation with rollator for increased distance at supervision level with 2L [place] and ability to maintain SpO2 88-96% throughout. Pt encouraged to maintain upright posture and able to see SpO2 on monitor with pt verbalizing understanding of importance and correlation between posture and SpO2. Continued education throughout re' increasing activity tolerance and importance of continued mobility as well as monitoring O2 sats post d/c, pt verbalizing understanding. Pt continues to benefit from skilled PT services to progress toward functional mobility goals.    Recommendations for follow up therapy are one component of a multi-disciplinary discharge planning process, led by the attending physician.  Recommendations may be updated based on patient status, additional functional criteria and insurance authorization.  Follow Up Recommendations  Home health PT     Assistance Recommended at Discharge Intermittent Supervision/Assistance  Patient can return home with the following Help with stairs or ramp for entrance;Assist for transportation;Assistance with cooking/housework;A little help with bathing/dressing/bathroom;A little help with walking and/or transfers   Equipment Recommendations  Rollator (4 wheels)    Recommendations for Other Services       Precautions / Restrictions Precautions Precautions: Fall Precaution Comments: watch sats Restrictions Weight Bearing Restrictions: No     Mobility  Bed Mobility Overal bed mobility: Modified  Independent             General bed mobility comments: increased time and use of bedrails    Transfers Overall transfer level: Needs assistance Equipment used: Rollator (4 wheels), None Transfers: Sit to/from Stand Sit to Stand: Supervision           General transfer comment: cues for safety and hand placement.    Ambulation/Gait Ambulation/Gait assistance: Supervision Gait Distance (Feet): 1500 Feet Assistive device: Rolling walker (2 wheels) Gait Pattern/deviations: Step-through pattern, Decreased stride length, Trunk flexed       General Gait Details: Cues for upper trunk control to improve posture. and SpO2,   Stairs             Wheelchair Mobility    Modified Rankin (Stroke Patients Only)       Balance Overall balance assessment: Needs assistance Sitting-balance support: Feet supported Sitting balance-Leahy Scale: Good     Standing balance support: Bilateral upper extremity supported Standing balance-Leahy Scale: Fair Standing balance comment: pt able to static stand without UE support, reaching for support with walking                            Cognition Arousal/Alertness: Awake/alert Behavior During Therapy: WFL for tasks assessed/performed Overall Cognitive Status: Within Functional Limits for tasks assessed                                          Exercises Other Exercises Other Exercises: STS x12 without UE support    General Comments General comments (skin integrity, edema, etc.): SpO2 88-96% throughuot on 2L [place], encouraged upright posture during ambulation with pt watching SpO2 monitor to  reinforce impotance and trand of SpO2 increase with more upright posture, pt verbliaizing understanding.      Pertinent Vitals/Pain Pain Assessment Pain Assessment: No/denies pain Pain Intervention(s): Monitored during session    Home Living                          Prior Function            PT  Goals (current goals can now be found in the care plan section) Acute Rehab PT Goals PT Goal Formulation: With patient Time For Goal Achievement: 11/09/21    Frequency    Min 3X/week      PT Plan Current plan remains appropriate    Co-evaluation              AM-PAC PT "6 Clicks" Mobility   Outcome Measure  Help needed turning from your back to your side while in a flat bed without using bedrails?: None Help needed moving from lying on your back to sitting on the side of a flat bed without using bedrails?: A Little Help needed moving to and from a bed to a chair (including a wheelchair)?: A Little Help needed standing up from a chair using your arms (e.g., wheelchair or bedside chair)?: A Little Help needed to walk in hospital room?: A Little Help needed climbing 3-5 steps with a railing? : A Little 6 Click Score: 19    End of Session Equipment Utilized During Treatment: Oxygen Activity Tolerance: Patient tolerated treatment well Patient left: in bed;with call bell/phone within reach;Other (comment) (seated EOB) Nurse Communication: Mobility status PT Visit Diagnosis: Other abnormalities of gait and mobility (R26.89);Muscle weakness (generalized) (M62.81)     Time: 8469-6295 PT Time Calculation (min) (ACUTE ONLY): 27 min  Charges:  $Therapeutic Exercise: 23-37 mins                    Carren Blakley R. PTA Acute Rehabilitation Services Office: [place] 11/01/2021, 4:20 PM

## 2021-11-01 NOTE — Inpatient Diabetes Management (Signed)
Inpatient Diabetes Program Recommendations  AACE/ADA: New Consensus Statement on Inpatient Glycemic Control   Target Ranges:  Prepandial:   less than 140 mg/dL      Peak postprandial:   less than 180 mg/dL (1-2 hours)      Critically ill patients:  140 - 180 mg/dL     Latest Reference Range & Units 10/31/21 06:30 10/31/21 11:59 10/31/21 16:34 10/31/21 21:08 11/01/21 06:28  Glucose-Capillary 70 - 99 mg/dL 201 (H) 247 (H) 172 (H) 202 (H) 177 (H)   Review of Glycemic Control  Current orders for Inpatient glycemic control: Novolog 0-24 units AC&HS  Inpatient Diabetes Program Recommendations:    Insulin: Please consider ordering Novolog 4 units TID with meals for meal coverage if patient eats at least 50% of meals.  Thanks, Barnie Alderman, RN, MSN, [place] Diabetes Coordinator Inpatient Diabetes Program [phone] (Team Pager from 8am to [place])

## 2021-11-01 NOTE — Progress Notes (Signed)
Progress Note  Patient Name: Chris Lucas Date of Encounter: 11/01/2021  [facility] Cardiologist: Janina Mayo, MD   Subjective   Feeling well.  No CP.  Breathing improving.  Denies palpitations.   Inpatient Medications    Scheduled Meds:  apixaban  5 mg Oral BID   atorvastatin  40 mg Oral Daily   bisacodyl  10 mg Oral Daily   carvedilol  6.25 mg Oral BID   furosemide  60 mg Intravenous BID   hydrALAZINE  50 mg Oral Q8H   insulin aspart  0-24 Units Subcutaneous TID AC & HS   pantoprazole  40 mg Oral Daily   potassium chloride  40 mEq Oral BID   senna-docusate  1 tablet Oral QHS   Continuous Infusions:  PRN Meds: bisacodyl, docusate sodium, hydrALAZINE, ipratropium-albuterol, morphine injection, ondansetron (ZOFRAN) IV, mouth rinse, oxyCODONE, polyethylene glycol, traMADol   Vital Signs    Vitals:   10/31/21 1944 10/31/21 2207 10/31/21 2344 11/01/21 0357  BP: 119/73 (!) 150/94 95/64 127/90  Pulse: 80  75 80  Resp:    18  Temp: 98.2 F (36.8 C)  98.3 F (36.8 C) 97.8 F (36.6 C)  TempSrc: Oral  Oral Oral  SpO2: 97%  98% 97%  Weight:      Height:        Intake/Output Summary (Last 24 hours) at 11/01/2021 0742 Last data filed at 11/01/2021 0630 Gross per 24 hour  Intake 240 ml  Output 2300 ml  Net -2060 ml      10/23/2021    5:53 AM 10/19/2021   12:55 PM 09/29/2021    8:42 AM  Last 3 Weights  Weight (lbs) 250 lb 257 lb 6.4 oz 255 lb  Weight (kg) 113.399 kg 116.756 kg 115.667 kg      Telemetry    Atrial flutter.  Rate <100 bpm. PVCs - Personally Reviewed  ECG    N/a - Personally Reviewed  Physical Exam   VS:  BP 127/90 (BP Location: Right Arm)   Pulse 80   Temp 97.8 F (36.6 C) (Oral)   Resp 18   Ht 6\' 1"  (1.854 m)   Wt 113.4 kg   SpO2 97%   BMI 32.98 kg/m  , BMI Body mass index is 32.98 kg/m. GENERAL:  Well appearing HEENT: Pupils equal round and reactive, fundi not visualized, oral mucosa unremarkable NECK: JVP 2 cm above  clavicle at 45 degrees.  Waveform within normal limits, carotid upstroke brisk and symmetric, no bruits, no thyromegaly LUNGS:  Clear to auscultation bilaterally HEART:  Irregularly irregular.  PMI not displaced or sustained,S1 and S2 within normal limits, no S3, no S4, no clicks, no rubs, no murmurs ABD:  Flat, positive bowel sounds normal in frequency in pitch, no bruits, no rebound, no guarding, no midline pulsatile mass, no hepatomegaly, no splenomegaly EXT: Legs wrapped in Unna boots. + Lower extremity SKIN:  No rashes no nodules NEURO:  Cranial nerves II through XII grossly intact, motor grossly intact throughout PSYCH:  Cognitively intact, oriented to person place and time   Labs    High Sensitivity Troponin:  No results for input(s): "TROPONINIHS" in the last 720 hours.   Chemistry Recent Labs  Lab 10/29/21 0032 10/30/21 0042 10/31/21 0031  NA 139 140 139  K 4.0 3.7 3.8  CL 98 95* 91*  CO2 34* 38* 39*  GLUCOSE 180* 161* 164*  BUN 25* 20 22  CREATININE 1.40* 1.43* 1.37*  CALCIUM 8.7*  8.6* 8.9  GFRNONAA 54* 53* 56*  ANIONGAP 7 7 9     Lipids No results for input(s): "CHOL", "TRIG", "HDL", "LABVLDL", "LDLCALC", "CHOLHDL" in the last 168 hours.  Hematology Recent Labs  Lab 10/26/21 0048  WBC 10.3  RBC 4.08*  HGB 12.7*  HCT 40.7  MCV 99.8  MCH 31.1  MCHC 31.2  RDW 13.1  PLT 160   Thyroid No results for input(s): "TSH", "FREET4" in the last 168 hours.  BNPNo results for input(s): "BNP", "PROBNP" in the last 168 hours.  DDimer No results for input(s): "DDIMER" in the last 168 hours.   Radiology    No results found.  Cardiac Studies   Echo 07/07/21:  1. Left ventricular ejection fraction, by estimation, is 55 to 60%. The  left ventricle has normal function. The left ventricle has no regional  wall motion abnormalities. There is moderate left ventricular hypertrophy.  Left ventricular diastolic  parameters are indeterminate.   2. Right ventricular systolic  function is normal. The right ventricular  size is normal.   3. Left atrial size was moderately dilated.   4. The mitral valve is normal in structure. No evidence of mitral valve  regurgitation. No evidence of mitral stenosis.   5. The aortic valve is tricuspid. There is mild calcification of the  aortic valve. There is mild thickening of the aortic valve. Aortic valve  regurgitation is not visualized. No aortic stenosis is present.   6. Aortic dilatation noted. There is mild dilatation of the ascending  aorta, measuring 39 mm.   7. The inferior vena cava is normal in size with greater than 50%  respiratory variability, suggesting right atrial pressure of 3 mmHg.   Patient Profile     71 y.o. male with HFpEF, paroxysmal atrial flutter, hypertension, CKD 3A, moderate ascending aortic aneurysm, diabetes, hyperlipidemia, and prior stroke admitted for lung resection for carcinoid tumor.  Cardiology consulted for atrial flutter with RVR and lower extremity edema.  Assessment & Plan    #Paroxysmal atrial flutter: Patient had successful cardioversion 08/2021.  He notes that he was feeling well after his cardioversion.  He is unsure of exactly when he went into atrial flutter and whether he felt any different.  He is now in atrial flutter with well-controlled rates.  Continue Eliquis and carvedilol.  Consider outpatient cardioversion.  # Acute on chronic diastolic heart failure:  # Hypertension: He was net -2L yesterday.  Volume status is much better.  We will d/c Unna boots and switch to compression socks.  Stop IV lasix and switch to lasix 40mg  po bid.  AM BMP pending.  Amlodipine held due to concern it was contributing to edema. Continue carvedilol  and hydralazine.  Plan to resume irbesartan or start Entresto prior to discharge as renal function continues to improve.     # Acute on chronic CKD 3a: Diuresis as above.  Renal function continues to improve.  Continue Farxiga at discharge.   #  Carcinoid tumor: s/p lung resection.  # CVA:  # Hyperlipidemia:  Continue Eliquis and atorvastatin.  # Moderate aortic aneurysm: Repeat imaging as an outpatient.  Ascending aorta was 4.8 cm on his chest CT 06/2021.  Blood pressure control and beta-blocker as above.    For questions or updates, please contact [place] Please consult [email or web address] for contact info under        Signed, [name], MD  11/01/2021, 7:42 AM

## 2021-11-01 NOTE — Progress Notes (Signed)
9 Days Post-Op Procedure(s) (LRB): XI ROBOTIC ASSISTED THORASCOPY-LEFT UPPER LOBECTOMY (Left) INTERCOSTAL NERVE BLOCK NODE DISSECTION Subjective: Patient states he is feeling better today and had a BM last night.  Objective: Vital signs in last 24 hours: Temp:  [97.8 F (36.6 C)-98.3 F (36.8 C)] 98.2 F (36.8 C) (07/26 0801) Pulse Rate:  [75-83] 80 (07/26 0801) Cardiac Rhythm: Atrial flutter (07/25 1900) Resp:  [17-18] 17 (07/26 0801) BP: (95-150)/(64-94) 112/76 (07/26 0801) SpO2:  [93 %-98 %] 93 % (07/26 0801)  Hemodynamic parameters for last 24 hours:    Intake/Output from previous day: 07/25 0701 - 07/26 0700 In: 240 [P.O.:240] Out: 2300 [Urine:2300] Intake/Output this shift: Total I/O In: -  Out: 250 [Urine:250]  General appearance: alert, cooperative, and no distress Heart: atrial flutter, no murmurs Lungs: clear to auscultation bilaterally Abdomen: soft, non-tender; bowel sounds normal; no masses,  no organomegaly Extremities: mild edema Wound: clean and dry, no sign of infection  Lab Results: No results for input(s): "WBC", "HGB", "HCT", "PLT" in the last 72 hours. BMET:  Recent Labs    10/30/21 0042 10/31/21 0031  NA 140 139  K 3.7 3.8  CL 95* 91*  CO2 38* 39*  GLUCOSE 161* 164*  BUN 20 22  CREATININE 1.43* 1.37*  CALCIUM 8.6* 8.9    PT/INR: No results for input(s): "LABPROT", "INR" in the last 72 hours. ABG    Component Value Date/Time   PHART 7.38 10/19/2021 1400   HCO3 27.2 10/19/2021 1400   O2SAT 97.4 10/19/2021 1400   CBG (last 3)  Recent Labs    10/31/21 1634 10/31/21 2108 11/01/21 0628  GLUCAP 172* 202* 177*    Assessment/Plan: S/P Procedure(s) (LRB): XI ROBOTIC ASSISTED THORASCOPY-LEFT UPPER LOBECTOMY (Left) INTERCOSTAL NERVE BLOCK NODE DISSECTION  CV-atrial flutter, diastolic HF, HTN, followed by cardiology Pulm-O2 sats 93 on 2L nasal cannula Renal-creatinine stable, CBGs slightly elevated 172/202/177 continue sliding  scale will restart Farxiga at discharge GI-bowel movement last night,  Extremities-mild edema improving with unna boots Dispo-Stable from surgical perspective. Will go home with home health when stable from cardiology standpoint.     LOS: 9 days    Magdalene River, PA-C 11/01/2021

## 2021-11-02 ENCOUNTER — Other Ambulatory Visit (HOSPITAL_COMMUNITY): Payer: Self-pay

## 2021-11-02 ENCOUNTER — Other Ambulatory Visit: Payer: Self-pay | Admitting: *Deleted

## 2021-11-02 ENCOUNTER — Other Ambulatory Visit: Payer: Self-pay | Admitting: Physician Assistant

## 2021-11-02 DIAGNOSIS — Z902 Acquired absence of lung [part of]: Secondary | ICD-10-CM | POA: Diagnosis not present

## 2021-11-02 DIAGNOSIS — I10 Essential (primary) hypertension: Secondary | ICD-10-CM | POA: Diagnosis not present

## 2021-11-02 DIAGNOSIS — I5033 Acute on chronic diastolic (congestive) heart failure: Secondary | ICD-10-CM

## 2021-11-02 DIAGNOSIS — N1832 Chronic kidney disease, stage 3b: Secondary | ICD-10-CM | POA: Diagnosis not present

## 2021-11-02 DIAGNOSIS — N171 Acute kidney failure with acute cortical necrosis: Secondary | ICD-10-CM | POA: Diagnosis not present

## 2021-11-02 LAB — BASIC METABOLIC PANEL
Anion gap: 7 (ref 5–15)
BUN: 22 mg/dL (ref 8–23)
CO2: 38 mmol/L — ABNORMAL HIGH (ref 22–32)
Calcium: 8.9 mg/dL (ref 8.9–10.3)
Chloride: 93 mmol/L — ABNORMAL LOW (ref 98–111)
Creatinine, Ser: 1.41 mg/dL — ABNORMAL HIGH (ref 0.61–1.24)
GFR, Estimated: 54 mL/min — ABNORMAL LOW (ref >60)
Glucose, Bld: 180 mg/dL — ABNORMAL HIGH (ref 70–99)
Potassium: 5.2 mmol/L — ABNORMAL HIGH (ref 3.5–5.1)
Sodium: 138 mmol/L (ref 135–145)

## 2021-11-02 LAB — GLUCOSE, CAPILLARY
Glucose-Capillary: 183 mg/dL — ABNORMAL HIGH (ref 70–99)
Glucose-Capillary: 211 mg/dL — ABNORMAL HIGH (ref 70–99)
Glucose-Capillary: 138 mg/dL — ABNORMAL HIGH (ref 70–99)
Glucose-Capillary: 160 mg/dL — ABNORMAL HIGH (ref 70–99)

## 2021-11-02 MED ORDER — DAPAGLIFLOZIN PROPANEDIOL 10 MG PO TABS
10.0000 mg | ORAL_TABLET | Freq: Every day | ORAL | Status: DC
  Administered 2021-11-02 – 2021-11-03 (×2): 10 mg via ORAL
  Filled 2021-11-02 (×2): qty 1

## 2021-11-02 MED ORDER — FUROSEMIDE 40 MG PO TABS
40.0000 mg | ORAL_TABLET | Freq: Every day | ORAL | 1 refills | Status: DC
  Filled 2021-11-02: qty 30, 30d supply, fill #0

## 2021-11-02 MED ORDER — DAPAGLIFLOZIN PROPANEDIOL 10 MG PO TABS
10.0000 mg | ORAL_TABLET | Freq: Every day | ORAL | 1 refills | Status: DC
  Filled 2021-11-02: qty 30, 30d supply, fill #0

## 2021-11-02 MED ORDER — POTASSIUM CHLORIDE 20 MEQ PO PACK: 20.0000 meq | PACK | Freq: Every day | ORAL | Status: DC

## 2021-11-02 MED ORDER — CARVEDILOL 6.25 MG PO TABS
6.2500 mg | ORAL_TABLET | Freq: Two times a day (BID) | ORAL | 1 refills | Status: DC
  Filled 2021-11-02: qty 60, 30d supply, fill #0

## 2021-11-02 MED ORDER — IRBESARTAN 150 MG PO TABS
150.0000 mg | ORAL_TABLET | Freq: Every day | ORAL | 1 refills | Status: DC
  Filled 2021-11-02: qty 30, 30d supply, fill #0

## 2021-11-02 MED ORDER — TRAMADOL HCL 50 MG PO TABS
50.0000 mg | ORAL_TABLET | Freq: Four times a day (QID) | ORAL | 0 refills | Status: AC | PRN
  Filled 2021-11-02: qty 28, 7d supply, fill #0

## 2021-11-02 MED ORDER — IRBESARTAN 150 MG PO TABS
150.0000 mg | ORAL_TABLET | Freq: Every day | ORAL | Status: DC
  Administered 2021-11-03: 150 mg via ORAL
  Filled 2021-11-02: qty 1

## 2021-11-02 NOTE — Plan of Care (Signed)
  Problem: Education: Goal: Knowledge of General Education information will improve Description: Including pain rating scale, medication(s)/side effects and non-pharmacologic comfort measures 11/02/2021 0233 by Shanon Ace, RN Outcome: Progressing 11/02/2021 0233 by Shanon Ace, RN Outcome: Progressing   Problem: Health Behavior/Discharge Planning: Goal: Ability to manage health-related needs will improve 11/02/2021 0233 by Shanon Ace, RN Outcome: Progressing 11/02/2021 0233 by Shanon Ace, RN Outcome: Progressing   Problem: Clinical Measurements: Goal: Ability to maintain clinical measurements within normal limits will improve 11/02/2021 0233 by Shanon Ace, RN Outcome: Progressing 11/02/2021 0233 by Shanon Ace, RN Outcome: Progressing Goal: Will remain free from infection 11/02/2021 0233 by Shanon Ace, RN Outcome: Progressing 11/02/2021 0233 by Shanon Ace, RN Outcome: Progressing Goal: Diagnostic test results will improve 11/02/2021 0233 by Shanon Ace, RN Outcome: Progressing 11/02/2021 0233 by Shanon Ace, RN Outcome: Progressing Goal: Respiratory complications will improve 11/02/2021 0233 by Shanon Ace, RN Outcome: Progressing 11/02/2021 0233 by Shanon Ace, RN Outcome: Progressing Goal: Cardiovascular complication will be avoided 11/02/2021 0233 by Shanon Ace, RN Outcome: Progressing 11/02/2021 0233 by Shanon Ace, RN Outcome: Progressing   Problem: Activity: Goal: Risk for activity intolerance will decrease 11/02/2021 0233 by Shanon Ace, RN Outcome: Progressing 11/02/2021 0233 by Shanon Ace, RN Outcome: Progressing   Problem: Nutrition: Goal: Adequate nutrition will be maintained 11/02/2021 0233 by Shanon Ace, RN Outcome: Progressing 11/02/2021 0233 by Shanon Ace, RN Outcome: Progressing   Problem: Coping: Goal: Level of anxiety will decrease 11/02/2021 0233 by Shanon Ace, RN Outcome: Progressing 11/02/2021 0233 by Shanon Ace, RN Outcome: Progressing   Problem: Elimination: Goal: Will not experience complications related to bowel motility 11/02/2021 0233 by Shanon Ace, RN Outcome: Progressing 11/02/2021 0233 by Shanon Ace, RN Outcome: Progressing Goal: Will not experience complications related to urinary retention 11/02/2021 0233 by Shanon Ace, RN Outcome: Progressing 11/02/2021 0233 by Shanon Ace, RN Outcome: Progressing   Problem: Pain Managment: Goal: General experience of comfort will improve 11/02/2021 0233 by Shanon Ace, RN Outcome: Progressing 11/02/2021 0233 by Shanon Ace, RN Outcome: Progressing   Problem: Safety: Goal: Ability to remain free from injury will improve 11/02/2021 0233 by Shanon Ace, RN Outcome: Progressing 11/02/2021 0233 by Shanon Ace, RN Outcome: Progressing   Problem: Skin Integrity: Goal: Risk for impaired skin integrity will decrease 11/02/2021 0233 by Shanon Ace, RN Outcome: Progressing 11/02/2021 0233 by Shanon Ace, RN Outcome: Progressing

## 2021-11-02 NOTE — Care Management Important Message (Signed)
Important Message  Patient Details  Name: Chris Lucas MRN: [id number] Date of Birth: Apr 30, 1950   Medicare Important Message Given:  Yes     Memory Argue 11/02/2021, 11:38 AM

## 2021-11-02 NOTE — TOC Transition Note (Addendum)
Transition of Care [facility]) - CM/SW Discharge Note   Patient Details  Name: NATALIE LECLAIRE MRN: [id number] Date of Birth: [birth year]  Transition of [facility]) CM/SW Contact:  Angelita Ingles, RN Phone [phone]  11/02/2021, 10:10 AM   Clinical Narrative:    Patient with discharge orders home heath and DME previously set up. Home O2 previously set up this admission with [employer].         Patient Goals and CMS Choice        Discharge Placement                       Discharge Plan and Services                DME Arranged: Oxygen, Walker rolling with seat DME Agency: [employer] Date DME Agency Contacted: 10/27/21 Time DME Agency Contacted: [phone] Representative spoke with at DME Agency: Melene Muller HH Arranged: PT, RN [facility] Agency: [facility] [place]        Social Determinants of Health ([place]) Interventions     Readmission Risk Interventions    10/27/2021    9:39 AM  Readmission Risk Prevention Plan  Transportation Screening Complete  PCP or Specialist Appt within 5-7 Days Complete  Home Care Screening Complete  Medication Review (RN CM) Referral to Pharmacy

## 2021-11-02 NOTE — Progress Notes (Signed)
      [address]       [place] [address]             [phone]      10 Days Post-Op Procedure(s) (LRB): XI ROBOTIC ASSISTED THORASCOPY-LEFT UPPER LOBECTOMY (Left) INTERCOSTAL NERVE BLOCK NODE DISSECTION Subjective: Feels better in general each day, no significant SOB  Objective: Vital signs in last 24 hours: Temp:  [97.8 F (36.6 C)-98.9 F (37.2 C)] 97.8 F (36.6 C) (07/27 0738) Pulse Rate:  [74-80] 80 (07/27 0738) Cardiac Rhythm: Atrial fibrillation (07/26 2000) Resp:  [16-20] 20 (07/27 0738) BP: (105-127)/(71-90) 120/86 (07/27 0738) SpO2:  [93 %-97 %] 93 % (07/27 0738)  Hemodynamic parameters for last 24 hours:    Intake/Output from previous day: 07/26 0701 - 07/27 0700 In: 480 [P.O.:480] Out: 950 [Urine:950] Intake/Output this shift: No intake/output data recorded.  General appearance: alert, cooperative, and no distress Heart: irregularly irregular rhythm Lungs: min dim in left base Abdomen: benign Extremities: minor edema Wound: incis healing well  Lab Results: No results for input(s): "WBC", "HGB", "HCT", "PLT" in the last 72 hours. BMET:  Recent Labs    11/01/21 0817 11/02/21 0046  NA 136 138  K 4.4 5.2*  CL 91* 93*  CO2 37* 38*  GLUCOSE 260* 180*  BUN 21 22  CREATININE 1.47* 1.41*  CALCIUM 8.7* 8.9    PT/INR: No results for input(s): "LABPROT", "INR" in the last 72 hours. ABG    Component Value Date/Time   PHART 7.38 10/19/2021 1400   HCO3 27.2 10/19/2021 1400   O2SAT 97.4 10/19/2021 1400   CBG (last 3)  Recent Labs    11/01/21 1626 11/01/21 2128 11/02/21 0606  GLUCAP 218* 152* 183*    Meds Scheduled Meds:  apixaban  5 mg Oral BID   atorvastatin  40 mg Oral Daily   bisacodyl  10 mg Oral Daily   carvedilol  6.25 mg Oral BID   fluticasone  2 spray Each Nare Daily   furosemide  40 mg Oral Daily   hydrALAZINE  50 mg Oral Q8H   insulin aspart  0-24 Units Subcutaneous TID AC & HS   loratadine  10 mg Oral  Daily   pantoprazole  40 mg Oral Daily   potassium chloride  40 mEq Oral BID   senna-docusate  1 tablet Oral QHS   Continuous Infusions: PRN Meds:.bisacodyl, docusate sodium, hydrALAZINE, ipratropium-albuterol, morphine injection, ondansetron (ZOFRAN) IV, mouth rinse, oxyCODONE, polyethylene glycol, traMADol  Xrays No results found.  Assessment/Plan: S/P Procedure(s) (LRB): XI ROBOTIC ASSISTED THORASCOPY-LEFT UPPER LOBECTOMY (Left) INTERCOSTAL NERVE BLOCK NODE DISSECTION POD#10  1 afeb, VSS aflutter  w/ CVR 2 sats ok on 2 liters HFNC- wean as able 3 voiding, I/O not  accurate 4 K+ 5.2, will reduce dose- on lasix 40 q day, creat stable in 1.3-1.4 range for several days 5 BS fair control- add farxiga at d/c 6 GDMT for diastolic HFper cardiology, poss start entresto or irbasartan soon 7 home when ok with cards, plus/minus home O2    LOS: 10 days    John Giovanni 11/02/2021

## 2021-11-02 NOTE — Progress Notes (Addendum)
Progress Note  Patient Name: Chris Lucas Date of Encounter: 11/02/2021  [facility] Cardiologist: Janina Mayo, MD   Subjective   Feeling well.  No CP.  Breathing improving.  Denies palpitations.   Inpatient Medications    Scheduled Meds:  apixaban  5 mg Oral BID   atorvastatin  40 mg Oral Daily   bisacodyl  10 mg Oral Daily   carvedilol  6.25 mg Oral BID   fluticasone  2 spray Each Nare Daily   furosemide  40 mg Oral Daily   hydrALAZINE  50 mg Oral Q8H   insulin aspart  0-24 Units Subcutaneous TID AC & HS   loratadine  10 mg Oral Daily   pantoprazole  40 mg Oral Daily   senna-docusate  1 tablet Oral QHS   Continuous Infusions:  PRN Meds: bisacodyl, docusate sodium, hydrALAZINE, ipratropium-albuterol, morphine injection, ondansetron (ZOFRAN) IV, mouth rinse, oxyCODONE, polyethylene glycol, traMADol   Vital Signs    Vitals:   11/01/21 2000 11/01/21 2245 11/02/21 0330 11/02/21 0738  BP: 122/90 127/87 114/86 120/86  Pulse: 74 80 79 80  Resp: 19 17 16 20   Temp: 98.1 F (36.7 C) 97.8 F (36.6 C) 98.9 F (37.2 C) 97.8 F (36.6 C)  TempSrc: Oral Oral Oral Oral  SpO2: 96% 96% 95% 93%  Weight:      Height:        Intake/Output Summary (Last 24 hours) at 11/02/2021 0905 Last data filed at 11/02/2021 0333 Gross per 24 hour  Intake 480 ml  Output 700 ml  Net -220 ml      10/23/2021    5:53 AM 10/19/2021   12:55 PM 09/29/2021    8:42 AM  Last 3 Weights  Weight (lbs) 250 lb 257 lb 6.4 oz 255 lb  Weight (kg) 113.399 kg 116.756 kg 115.667 kg      Telemetry    Atrial flutter.  Rate <100 bpm. - Personally Reviewed  ECG    N/a - Personally Reviewed  Physical Exam   VS:  BP 120/86   Pulse 80   Temp 97.8 F (36.6 C) (Oral)   Resp 20   Ht 6\' 1"  (1.854 m)   Wt 113.4 kg   SpO2 93%   BMI 32.98 kg/m  , BMI Body mass index is 32.98 kg/m. GENERAL:  Well appearing HEENT: Pupils equal round and reactive, fundi not visualized, oral mucosa  unremarkable NECK: No JVD.  Waveform within normal limits, carotid upstroke brisk and symmetric, no bruits, no thyromegaly LUNGS:  Clear to auscultation bilaterally HEART:  Irregularly irregular.  PMI not displaced or sustained,S1 and S2 within normal limits, no S3, no S4, no clicks, no rubs, no murmurs ABD:  Flat, positive bowel sounds normal in frequency in pitch, no bruits, no rebound, no guarding, no midline pulsatile mass, no hepatomegaly, no splenomegaly EXT: No edema SKIN:  No rashes no nodules NEURO:  Cranial nerves II through XII grossly intact, motor grossly intact throughout PSYCH:  Cognitively intact, oriented to person place and time   Labs    High Sensitivity Troponin:  No results for input(s): "TROPONINIHS" in the last 720 hours.   Chemistry Recent Labs  Lab 10/31/21 0031 11/01/21 0817 11/02/21 0046  NA 139 136 138  K 3.8 4.4 5.2*  CL 91* 91* 93*  CO2 39* 37* 38*  GLUCOSE 164* 260* 180*  BUN 22 21 22   CREATININE 1.37* 1.47* 1.41*  CALCIUM 8.9 8.7* 8.9  GFRNONAA 56* 51* 54*  ANIONGAP 9 8 7     Lipids No results for input(s): "CHOL", "TRIG", "HDL", "LABVLDL", "LDLCALC", "CHOLHDL" in the last 168 hours.  Hematology No results for input(s): "WBC", "RBC", "HGB", "HCT", "MCV", "MCH", "MCHC", "RDW", "PLT" in the last 168 hours.  Thyroid No results for input(s): "TSH", "FREET4" in the last 168 hours.  BNPNo results for input(s): "BNP", "PROBNP" in the last 168 hours.  DDimer No results for input(s): "DDIMER" in the last 168 hours.   Radiology    No results found.  Cardiac Studies   Echo 07/07/21:  1. Left ventricular ejection fraction, by estimation, is 55 to 60%. The  left ventricle has normal function. The left ventricle has no regional  wall motion abnormalities. There is moderate left ventricular hypertrophy.  Left ventricular diastolic  parameters are indeterminate.   2. Right ventricular systolic function is normal. The right ventricular  size is normal.    3. Left atrial size was moderately dilated.   4. The mitral valve is normal in structure. No evidence of mitral valve  regurgitation. No evidence of mitral stenosis.   5. The aortic valve is tricuspid. There is mild calcification of the  aortic valve. There is mild thickening of the aortic valve. Aortic valve  regurgitation is not visualized. No aortic stenosis is present.   6. Aortic dilatation noted. There is mild dilatation of the ascending  aorta, measuring 39 mm.   7. The inferior vena cava is normal in size with greater than 50%  respiratory variability, suggesting right atrial pressure of 3 mmHg.   Patient Profile     71 y.o. male with HFpEF, paroxysmal atrial flutter, hypertension, CKD 3A, moderate ascending aortic aneurysm, diabetes, hyperlipidemia, and prior stroke admitted for lung resection for carcinoid tumor.  Cardiology consulted for atrial flutter with RVR and lower extremity edema.  Assessment & Plan    # Atrial flutter: Patient had successful cardioversion 08/2021.  He notes that he was feeling well after his cardioversion.  He is unsure of exactly when he went into atrial flutter and whether he felt any different.  He is now in atrial flutter with well-controlled rates.  Continue Eliquis and carvedilol.  Consider outpatient cardioversion.  # Acute on chronic diastolic heart failure:  # Hypertension: He is net -8.5 L since admission.  He maintained a negative fluid balance yesterday on oral diuretics.  He appears to be euvolemic and doing well.  We will stop his potassium supplement.  Continue Lasix 40 mg p.o. daily.  Continue to have him hold his amlodipine as it may have been contributing to his edema.  We will stop the hydralazine and resume his home on losartan.  Recommend getting a BMP in a week.     # Acute on chronic CKD 3a: Diuresis as above.  Renal function continues to improve.  Restart Wilder Glade.  # Carcinoid tumor: s/p lung resection.  # CVA:  #  Hyperlipidemia:  Continue Eliquis and atorvastatin.  # Moderate aortic aneurysm: Repeat imaging as an outpatient.  Ascending aorta was 4.8 cm on his chest CT 06/2021.  Blood pressure control and beta-blocker as above.  [facility] will sign off.   Medication Recommendations:  stop hydralazine and potassium.  Resume olmesartan and Farxiga Other recommendations (labs, testing, etc):  BMP in 1 week Follow up as an outpatient:  we will arrange     For questions or updates, please contact [place] Please consult [email or web address] for contact info under  Signed, [name], MD  11/02/2021, 9:05 AM

## 2021-11-02 NOTE — Plan of Care (Signed)
Problem: Education: Goal: Knowledge of General Education information will improve Description: Including pain rating scale, medication(s)/side effects and non-pharmacologic comfort measures 11/02/2021 0450 by Shanon Ace, RN Outcome: Progressing 11/02/2021 0233 by Shanon Ace, RN Outcome: Progressing 11/02/2021 0233 by Shanon Ace, RN Outcome: Progressing   Problem: Health Behavior/Discharge Planning: Goal: Ability to manage health-related needs will improve 11/02/2021 0450 by Shanon Ace, RN Outcome: Progressing 11/02/2021 0233 by Shanon Ace, RN Outcome: Progressing 11/02/2021 0233 by Shanon Ace, RN Outcome: Progressing   Problem: Clinical Measurements: Goal: Ability to maintain clinical measurements within normal limits will improve 11/02/2021 0450 by Shanon Ace, RN Outcome: Progressing 11/02/2021 0233 by Shanon Ace, RN Outcome: Progressing 11/02/2021 0233 by Shanon Ace, RN Outcome: Progressing Goal: Will remain free from infection 11/02/2021 0450 by Shanon Ace, RN Outcome: Progressing 11/02/2021 0233 by Shanon Ace, RN Outcome: Progressing 11/02/2021 0233 by Shanon Ace, RN Outcome: Progressing Goal: Diagnostic test results will improve 11/02/2021 0450 by Shanon Ace, RN Outcome: Progressing 11/02/2021 0233 by Shanon Ace, RN Outcome: Progressing 11/02/2021 0233 by Shanon Ace, RN Outcome: Progressing Goal: Respiratory complications will improve 11/02/2021 0450 by Shanon Ace, RN Outcome: Progressing 11/02/2021 0233 by Shanon Ace, RN Outcome: Progressing 11/02/2021 0233 by Shanon Ace, RN Outcome: Progressing Goal: Cardiovascular complication will be avoided 11/02/2021 0450 by Shanon Ace, RN Outcome: Progressing 11/02/2021 0233 by Shanon Ace, RN Outcome: Progressing 11/02/2021 0233 by Shanon Ace, RN Outcome: Progressing   Problem: Activity: Goal: Risk for activity intolerance will decrease 11/02/2021 0450 by Shanon Ace,  RN Outcome: Progressing 11/02/2021 0233 by Shanon Ace, RN Outcome: Progressing 11/02/2021 0233 by Shanon Ace, RN Outcome: Progressing   Problem: Nutrition: Goal: Adequate nutrition will be maintained 11/02/2021 0450 by Shanon Ace, RN Outcome: Progressing 11/02/2021 0233 by Shanon Ace, RN Outcome: Progressing 11/02/2021 0233 by Shanon Ace, RN Outcome: Progressing   Problem: Coping: Goal: Level of anxiety will decrease 11/02/2021 0450 by Shanon Ace, RN Outcome: Progressing 11/02/2021 0233 by Shanon Ace, RN Outcome: Progressing 11/02/2021 0233 by Shanon Ace, RN Outcome: Progressing   Problem: Elimination: Goal: Will not experience complications related to bowel motility 11/02/2021 0450 by Shanon Ace, RN Outcome: Progressing 11/02/2021 0233 by Shanon Ace, RN Outcome: Progressing 11/02/2021 0233 by Shanon Ace, RN Outcome: Progressing Goal: Will not experience complications related to urinary retention 11/02/2021 0450 by Shanon Ace, RN Outcome: Progressing 11/02/2021 0233 by Shanon Ace, RN Outcome: Progressing 11/02/2021 0233 by Shanon Ace, RN Outcome: Progressing   Problem: Pain Managment: Goal: General experience of comfort will improve 11/02/2021 0450 by Shanon Ace, RN Outcome: Progressing 11/02/2021 0233 by Shanon Ace, RN Outcome: Progressing 11/02/2021 0233 by Shanon Ace, RN Outcome: Progressing   Problem: Safety: Goal: Ability to remain free from injury will improve 11/02/2021 0450 by Shanon Ace, RN Outcome: Progressing 11/02/2021 0233 by Shanon Ace, RN Outcome: Progressing 11/02/2021 0233 by Shanon Ace, RN Outcome: Progressing   Problem: Skin Integrity: Goal: Risk for impaired skin integrity will decrease 11/02/2021 0450 by Shanon Ace, RN Outcome: Progressing 11/02/2021 0233 by Shanon Ace, RN Outcome: Progressing 11/02/2021 0233 by Shanon Ace, RN Outcome: Progressing   Problem: Education: Goal:  Knowledge of disease or condition will improve Outcome: Progressing Goal: Knowledge of the prescribed therapeutic regimen will improve Outcome: Progressing   Problem: Activity: Goal: Risk for activity intolerance will decrease Outcome: Progressing   Problem: Cardiac: Goal: Will achieve and/or maintain hemodynamic stability Outcome: Progressing   Problem: Clinical Measurements: Goal: Postoperative complications will be avoided or minimized Outcome: Progressing   Problem: Respiratory: Goal: Respiratory status  will improve Outcome: Progressing   Problem: Pain Management: Goal: Pain level will decrease Outcome: Progressing   Problem: Skin Integrity: Goal: Wound healing without signs and symptoms infection will improve Outcome: Progressing

## 2021-11-02 NOTE — Progress Notes (Signed)
Mobility Specialist Progress Note    11/02/21 1128  Mobility  Activity Ambulated independently in hallway  Level of Assistance Modified independent, requires aide device or extra time  Assistive Device Four wheel walker  Distance Ambulated (ft) 2000 ft  Activity Response Tolerated well  $Mobility charge 1 Mobility   During Mobility: 86 HR, 94% SpO2 Post-Mobility: 81 HR, 95% SpO2  Pt received sitting EOB and agreeable. No complaints on walk. On 1LO2, pt desatted into mid 80s. Maintained SpO2 >=90% on 2LO2. Returned to sitting EOB with call bell in reach.    Hildred Alamin Mobility Specialist

## 2021-11-02 NOTE — Progress Notes (Signed)
The proposed treatment discussed in conference is for discussion purpose only and is not a binding recommendation.  The patients have not been physically examined, or presented with their treatment options.  Therefore, final treatment plans cannot be decided.  

## 2021-11-03 ENCOUNTER — Ambulatory Visit: Payer: Self-pay | Admitting: Thoracic Surgery (Cardiothoracic Vascular Surgery)

## 2021-11-03 LAB — GLUCOSE, CAPILLARY: Glucose-Capillary: 157 mg/dL — ABNORMAL HIGH (ref 70–99)

## 2021-11-03 LAB — BASIC METABOLIC PANEL
Anion gap: 5 (ref 5–15)
BUN: 21 mg/dL (ref 8–23)
CO2: 38 mmol/L — ABNORMAL HIGH (ref 22–32)
Calcium: 8.9 mg/dL (ref 8.9–10.3)
Chloride: 95 mmol/L — ABNORMAL LOW (ref 98–111)
Creatinine, Ser: 1.62 mg/dL — ABNORMAL HIGH (ref 0.61–1.24)
GFR, Estimated: 46 mL/min — ABNORMAL LOW (ref >60)
Glucose, Bld: 149 mg/dL — ABNORMAL HIGH (ref 70–99)
Potassium: 4.7 mmol/L (ref 3.5–5.1)
Sodium: 138 mmol/L (ref 135–145)

## 2021-11-03 NOTE — Progress Notes (Signed)
Pt got discharged to home, discharge instructions provided and patient showed understanding to it, IV taken out,Telemonitor DC,pt left unit in wheelchair with all of the belongings accompanied with a family member (wife)  Mylan Lengyel,RN 

## 2021-11-03 NOTE — Progress Notes (Signed)
      [address]       [place] [address]             [phone]      11 Days Post-Op Procedure(s) (LRB): XI ROBOTIC ASSISTED THORASCOPY-LEFT UPPER LOBECTOMY (Left) INTERCOSTAL NERVE BLOCK NODE DISSECTION Subjective: Feels well overall, minor discomforts  Objective: Vital signs in last 24 hours: Temp:  [97.8 F (36.6 C)-98.7 F (37.1 C)] 97.9 F (36.6 C) (07/28 0437) Pulse Rate:  [79-94] 94 (07/28 0437) Cardiac Rhythm: Atrial flutter (07/27 2339) Resp:  [17-20] 17 (07/28 0437) BP: (103-153)/(73-119) 131/96 (07/28 0437) SpO2:  [90 %-98 %] 90 % (07/28 0437)  Hemodynamic parameters for last 24 hours:    Intake/Output from previous day: 07/27 0701 - 07/28 0700 In: -  Out: 2350 [Urine:2350] Intake/Output this shift: No intake/output data recorded.  Alert, NAD Lungs min dim in bases Cor, slightly irregular Lungs, mildly dim in bases Abd: benign exam Incis healing well Ext: edema conts to improve  Lab Results: No results for input(s): "WBC", "HGB", "HCT", "PLT" in the last 72 hours. BMET:  Recent Labs    11/02/21 0046 11/02/21 2353  NA 138 138  K 5.2* 4.7  CL 93* 95*  CO2 38* 38*  GLUCOSE 180* 149*  BUN 22 21  CREATININE 1.41* 1.62*  CALCIUM 8.9 8.9    PT/INR: No results for input(s): "LABPROT", "INR" in the last 72 hours. ABG    Component Value Date/Time   PHART 7.38 10/19/2021 1400   HCO3 27.2 10/19/2021 1400   O2SAT 97.4 10/19/2021 1400   CBG (last 3)  Recent Labs    11/02/21 1551 11/02/21 2100 11/03/21 0615  GLUCAP 138* 160* 157*    Meds Scheduled Meds:  apixaban  5 mg Oral BID   atorvastatin  40 mg Oral Daily   bisacodyl  10 mg Oral Daily   carvedilol  6.25 mg Oral BID   dapagliflozin propanediol  10 mg Oral Daily   fluticasone  2 spray Each Nare Daily   furosemide  40 mg Oral Daily   insulin aspart  0-24 Units Subcutaneous TID AC & HS   irbesartan  150 mg Oral Daily   loratadine  10 mg Oral Daily    pantoprazole  40 mg Oral Daily   senna-docusate  1 tablet Oral QHS   Continuous Infusions: PRN Meds:.bisacodyl, docusate sodium, hydrALAZINE, ipratropium-albuterol, morphine injection, ondansetron (ZOFRAN) IV, mouth rinse, oxyCODONE, polyethylene glycol, traMADol  Xrays No results found.  Assessment/Plan: S/P Procedure(s) (LRB): XI ROBOTIC ASSISTED THORASCOPY-LEFT UPPER LOBECTOMY (Left) INTERCOSTAL NERVE BLOCK NODE DISSECTION POD#12  1 afeb, VSS, stable rate controlled aflutter 2 sats ok on 3 liters 3 good UOP, creat 1.62, CKD 3 4 CBG adeq control, farxiga resumed at d/c  5 stable for d/c- family was unable to take home yesterday   LOS: 11 days    John Giovanni PA-C Pager [phone] 11/03/2021

## 2021-11-03 NOTE — Plan of Care (Signed)
  Problem: Education: Goal: Knowledge of General Education information will improve Description: Including pain rating scale, medication(s)/side effects and non-pharmacologic comfort measures Outcome: Adequate for Discharge   Problem: Health Behavior/Discharge Planning: Goal: Ability to manage health-related needs will improve Outcome: Adequate for Discharge   Problem: Clinical Measurements: Goal: Ability to maintain clinical measurements within normal limits will improve Outcome: Adequate for Discharge Goal: Will remain free from infection Outcome: Adequate for Discharge Goal: Diagnostic test results will improve Outcome: Adequate for Discharge Goal: Respiratory complications will improve Outcome: Adequate for Discharge Goal: Cardiovascular complication will be avoided Outcome: Adequate for Discharge   Problem: Activity: Goal: Risk for activity intolerance will decrease Outcome: Adequate for Discharge   Problem: Nutrition: Goal: Adequate nutrition will be maintained Outcome: Adequate for Discharge   Problem: Coping: Goal: Level of anxiety will decrease Outcome: Adequate for Discharge   Problem: Elimination: Goal: Will not experience complications related to bowel motility Outcome: Adequate for Discharge Goal: Will not experience complications related to urinary retention Outcome: Adequate for Discharge   Problem: Pain Managment: Goal: General experience of comfort will improve Outcome: Adequate for Discharge   Problem: Safety: Goal: Ability to remain free from injury will improve Outcome: Adequate for Discharge   Problem: Skin Integrity: Goal: Risk for impaired skin integrity will decrease Outcome: Adequate for Discharge   Problem: Education: Goal: Knowledge of disease or condition will improve Outcome: Adequate for Discharge Goal: Knowledge of the prescribed therapeutic regimen will improve Outcome: Adequate for Discharge   Problem: Activity: Goal: Risk for  activity intolerance will decrease Outcome: Adequate for Discharge   Problem: Cardiac: Goal: Will achieve and/or maintain hemodynamic stability Outcome: Adequate for Discharge   Problem: Clinical Measurements: Goal: Postoperative complications will be avoided or minimized Outcome: Adequate for Discharge   Problem: Respiratory: Goal: Respiratory status will improve Outcome: Adequate for Discharge   Problem: Pain Management: Goal: Pain level will decrease Outcome: Adequate for Discharge   Problem: Skin Integrity: Goal: Wound healing without signs and symptoms infection will improve Outcome: Adequate for Discharge   Problem: Education: Goal: Ability to describe self-care measures that may prevent or decrease complications (Diabetes Survival Skills Education) will improve Outcome: Adequate for Discharge Goal: Individualized Educational Video(s) Outcome: Adequate for Discharge   Problem: Coping: Goal: Ability to adjust to condition or change in health will improve Outcome: Adequate for Discharge   Problem: Fluid Volume: Goal: Ability to maintain a balanced intake and output will improve Outcome: Adequate for Discharge   Problem: Health Behavior/Discharge Planning: Goal: Ability to identify and utilize available resources and services will improve Outcome: Adequate for Discharge Goal: Ability to manage health-related needs will improve Outcome: Adequate for Discharge   Problem: Metabolic: Goal: Ability to maintain appropriate glucose levels will improve Outcome: Adequate for Discharge   Problem: Nutritional: Goal: Maintenance of adequate nutrition will improve Outcome: Adequate for Discharge Goal: Progress toward achieving an optimal weight will improve Outcome: Adequate for Discharge   Problem: Skin Integrity: Goal: Risk for impaired skin integrity will decrease Outcome: Adequate for Discharge   Problem: Tissue Perfusion: Goal: Adequacy of tissue perfusion will  improve Outcome: Adequate for Discharge

## 2021-11-04 DIAGNOSIS — Z483 Aftercare following surgery for neoplasm: Secondary | ICD-10-CM | POA: Diagnosis not present

## 2021-11-07 DIAGNOSIS — I10 Essential (primary) hypertension: Secondary | ICD-10-CM | POA: Diagnosis not present

## 2021-11-08 ENCOUNTER — Telehealth: Payer: Self-pay | Admitting: *Deleted

## 2021-11-08 NOTE — Telephone Encounter (Signed)
V/O provided to [facility] with [facility] for PT 2x/week for 3 weeks and 1x/week for 4 weeks.

## 2021-11-09 ENCOUNTER — Other Ambulatory Visit: Payer: Self-pay

## 2021-11-09 DIAGNOSIS — I5033 Acute on chronic diastolic (congestive) heart failure: Secondary | ICD-10-CM

## 2021-11-10 LAB — BASIC METABOLIC PANEL
BUN/Creatinine Ratio: 17 (ref 10–24)
BUN: 26 mg/dL (ref 8–27)
CO2: 32 mmol/L — ABNORMAL HIGH (ref 20–29)
Calcium: 9.4 mg/dL (ref 8.6–10.2)
Chloride: 95 mmol/L — ABNORMAL LOW (ref 96–106)
Creatinine, Ser: 1.57 mg/dL — ABNORMAL HIGH (ref 0.76–1.27)
Glucose: 239 mg/dL — ABNORMAL HIGH (ref 70–99)
Potassium: 5.4 mmol/L — ABNORMAL HIGH (ref 3.5–5.2)
Sodium: 137 mmol/L (ref 134–144)
eGFR: 47 mL/min/{1.73_m2} — ABNORMAL LOW (ref >59)

## 2021-11-14 NOTE — Progress Notes (Signed)
Kidney function is stable, however potassium remain borderline high, the only medication in his medication list that can raise potassium is Irbesartan, recommend reduce Irbesartan to 75mg  daily (1/2 tablet of 150mg ). Repeat BMET in 3-4 weeks

## 2021-11-15 ENCOUNTER — Other Ambulatory Visit: Payer: Self-pay

## 2021-11-15 ENCOUNTER — Other Ambulatory Visit: Payer: Self-pay | Admitting: Thoracic Surgery (Cardiothoracic Vascular Surgery)

## 2021-11-15 DIAGNOSIS — Z902 Acquired absence of lung [part of]: Secondary | ICD-10-CM

## 2021-11-15 DIAGNOSIS — Z79899 Other long term (current) drug therapy: Secondary | ICD-10-CM

## 2021-11-15 MED ORDER — IRBESARTAN 150 MG PO TABS: 75.0000 mg | ORAL_TABLET | Freq: Every day | ORAL | Status: DC

## 2021-11-16 NOTE — Progress Notes (Signed)
[address]       [place] [address]             [phone]        Blase M Remedios [address] Medical Record #[id number] Date of Birth: 1951-03-02  Referring: [name], MD Primary Care: [name], MD Primary Cardiologist:Branch, Royetta Crochet, MD  Reason for visit:   follow-up  History of Present Illness:     71yo male presents for 1st follow-up after undergoing LULectomy for typical carcinoid.  Overall he is doing well.  He remains on 2 L of oxygen.  He is not using the incentive spirometer much.  Physical Exam: BP (!) 145/95 (BP Location: Left Arm, Patient Position: Sitting)   Pulse 74   Ht 6\' 1"  (1.854 m)   Wt 243 lb (110.2 kg)   SpO2 99% Comment: 2L O2   BMI 32.06 kg/m   Alert NAD Incision clean.   Abdomen, ND Pitting peripheral edema   Diagnostic Studies & Laboratory data:  Path:  FINAL MICROSCOPIC DIAGNOSIS:   A. LYMPH NODE, LEVEL 9, EXCISION:  -  1 benign anthracotically stained lymph node (0/1).   B. LYMPH NODE, LEVEL 7, EXCISION:  -  1 benign anthracotically stained lymph node (0/1).   C. LYMPH NODE, HILAR, EXCISION:  -  1 benign anthracotically stained lymph node (0/1).   D. LYMPH NODE, HILAR #2, EXCISION:  -  1 benign anthracotically stained lymph node (0/1).   E. LYMPH NODE, LEVEL5, EXCISION:  -  1 benign anthracotically stained lymph node (0/1).   F. LYMPH NODE, LEVEL 6, EXCISION:  -  1 benign anthracotically stained lymph node (0/1).   G. LYMPH NODE, LEVEL 6 #2, EXCISION:  -  1 benign anthracotically stained lymph node (0/1).   H. LUNG, LEFT UPPER LOBE, LOBECTOMY:  -  Typical carcinoid (3.2 cm in greatest dimension), confirmatory  immunohistochemical stains (synaptophysin, chromogranin and CD56) were  performed on the biopsy ([id number])  -  Essentially absent mitoses and negative for necrosis, consistent with  typical carcinoid.  -  Margins negative  -  3 anthracotically stained hilar lymph nodes, negative  for malignancy  (0/3)  pT2a pN0 pM n/a, see also synoptic report below.    ONCOLOGY TABLE:    LUNG: Resection   Synchronous Tumors: N/A  Total Number of Primary Tumors: 1  Procedure: Lobectomy  Specimen Laterality: Left upper lobe  Tumor Focality: Unifocal  Tumor Site: Left upper lobe  Tumor Size: 3.2 x 2.7 x 2.3 cm       Total Tumor Size: Same as above       Invasive Tumor Size (applies only to invasive nonmucinous  adenocarcinoma with a lepidic            component): N/A  Histologic Type: Typical carcinoid  Visceral Pleura Invasion: Not identified  Direct Invasion of Adjacent Structures: No adjacent structures present  Lymphovascular Invasion: Not identified  Margins: All margins negative for malignancy       Closest Margin(s) to Invasive Carcinoma: Bronchial margin, 2.5 cm       Margin(s) Involved by Invasive Carcinoma: N/A       Margin Status for Non-Invasive Tumor: N/A  Treatment Effect: No known presurgical therapy       Percentage of Residual Viable Tumor: N/A  Regional Lymph Nodes:       Number of Lymph Nodes Involved: 0  Nodal Sites with Tumor: 0       Number of Lymph Nodes Examined: 10                       Nodal Sites Examined: 5  Distant Metastasis:       Distant Site(s) Involved: N/A  Pathologic Stage Classification (pTNM, AJCC 8th Edition): pT2a, pN0    Assessment / Plan:   71 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  Will refer to medical oncology for ongoing surveillance.  Continue medical management of heart failure. Will f/u in 1 month with a CXR   Lajuana Matte 11/17/2021 4:31 PM

## 2021-11-17 ENCOUNTER — Telehealth: Payer: Self-pay | Admitting: Internal Medicine

## 2021-11-17 ENCOUNTER — Ambulatory Visit (INDEPENDENT_AMBULATORY_CARE_PROVIDER_SITE_OTHER): Payer: Self-pay | Admitting: Thoracic Surgery (Cardiothoracic Vascular Surgery)

## 2021-11-17 ENCOUNTER — Ambulatory Visit
Admission: RE | Admit: 2021-11-17 | Discharge: 2021-11-17 | Disposition: A | Discharge status: Home / Self Care | Payer: Medicare HMO | Source: Ambulatory Visit | Attending: Thoracic Surgery (Cardiothoracic Vascular Surgery) | Admitting: Thoracic Surgery (Cardiothoracic Vascular Surgery)

## 2021-11-17 VITALS — BP 145/95 | HR 74 | Ht 73.0 in | Wt 243.0 lb

## 2021-11-17 DIAGNOSIS — R918 Other nonspecific abnormal finding of lung field: Secondary | ICD-10-CM | POA: Diagnosis not present

## 2021-11-17 DIAGNOSIS — Z902 Acquired absence of lung [part of]: Secondary | ICD-10-CM

## 2021-11-17 DIAGNOSIS — D3A090 Benign carcinoid tumor of the bronchus and lung: Secondary | ICD-10-CM

## 2021-11-17 NOTE — Telephone Encounter (Signed)
Scheduled appt per 8/22 referral. Pt is aware of appt date and time. Pt is aware to arrive 15 mins prior to appt time and to bring and updated insurance card. Pt is aware of appt location.   

## 2021-11-23 ENCOUNTER — Ambulatory Visit: Payer: Medicare HMO | Admitting: Internal Medicine

## 2021-11-23 ENCOUNTER — Encounter: Payer: Self-pay | Admitting: Internal Medicine

## 2021-11-23 VITALS — BP 146/94 | HR 74 | Ht 72.0 in | Wt 243.4 lb

## 2021-11-23 DIAGNOSIS — I7121 Aneurysm of the ascending aorta, without rupture: Secondary | ICD-10-CM

## 2021-11-23 NOTE — Patient Instructions (Signed)
Medication Instructions:  No Changes In Medications at this time.  *If you need a refill on your cardiac medications before your next appointment, please call your pharmacy*  Lab Work: None Ordered At This Time.  If you have labs (blood work) drawn today and your tests are completely normal, you will receive your results only by: [place] (if you have MyChart) OR A paper copy in the mail If you have any lab test that is abnormal or we need to change your treatment, we will call you to review the results.  Testing/Procedures: CHEST CT WITH NO CONTRAST- IN 2 MONTHS  Follow-Up: At [facility], you and your health needs are our priority.  As part of our continuing mission to provide you with exceptional heart care, we have created designated Provider Care Teams.  These Care Teams include your primary Cardiologist (physician) and Advanced Practice Providers (APPs -  Physician Assistants and Nurse Practitioners) who all work together to provide you with the care you need, when you need it.  Your next appointment:   6 month(s)  The format for your next appointment:   In Person  Provider:   Janina Mayo, MD

## 2021-11-23 NOTE — Progress Notes (Signed)
Cardiology Office Note:    Date:  11/23/2021   ID:  OLAJUWON Lucas, DOB 08/23/1950, MRN [id number]  PCP:  [name], MD   [place] [facility] Cardiologist:  Janina Mayo, MD     Referring MD: [name], MD   No chief complaint on file. Establish care  History of Present Illness:    Chris Lucas is a 71 y.o. 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 He had transient LOC for 30 seconds. He felt nauseas at this time after eating. EKG was notable for typical atrial flutter with variable block. LOC cause was unknown; unlikely cardiac.  For his atrial flutter he was started on eliquis.  At this time he had an incidental finding of a lung mass c/f malignancy. He had biopsy + for carcinoid tumor. He was also noted to have 4.8 cm ascending thoracic aortic aneurysm.  Today, he presents for follow up. He's had no recurrence of his symptoms of LOC. No chest pressure or SOB. He lifts weights.TSH is normal. He has apneic events per his wife while sleeping  Otherwise he notes he had a cath 20 years ago. He notes allergy to contrast dye.   He was seen by Dr. Kipp Brood for resection of his carcinoid tumor recently. Recommended PFTs prior to procedure.  Does not have an oncologist.   Interim Hx 11/23/2021:  He is s/p successful DCCV in May 2023. 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 Kipp Brood. He is on 2L O2. He has an appointment with medical oncology  He had LE and found to be atrial flutter again later in July.He was diuresed net negative 8.5L. He was continued on lasix 40 mg daily. Norvasc was held. Plan was to consider outpatient DCCV  He does not exert himself extensively.  He denies palpitations. No LH. He remains in atrial flutter   Cardiology  Studies 07/07/2021:TTE- EF normal.  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   Pneumonia    Pre-diabetes    diet controlled   Stroke ([place]) 07/06/2021   Thoracic ascending aortic aneurysm [facility])     Past Surgical History:  Procedure Laterality Date   BRONCHIAL BIOPSY  07/24/2021   Procedure: BRONCHIAL BIOPSIES;  Surgeon: Collene Gobble, MD;  Location: [place];  Service: Pulmonary;;   BRONCHIAL BRUSHINGS  07/24/2021   Procedure: BRONCHIAL BRUSHINGS;  Surgeon: Collene Gobble, MD;  Location: [facility] ENDOSCOPY;  Service: Pulmonary;;   BRONCHIAL NEEDLE ASPIRATION BIOPSY  07/24/2021   Procedure: BRONCHIAL NEEDLE ASPIRATION BIOPSIES;  Surgeon: Collene Gobble, MD;  Location: [facility] ENDOSCOPY;  Service: Pulmonary;;   CARDIAC CATHETERIZATION     "Years ago"  (apporx 18-20 yrs)   CARDIOVERSION N/A 09/01/2021   Procedure: CARDIOVERSION;  Surgeon: [name], MD;  Location: [facility] ENDOSCOPY;  Service: Cardiovascular;  Laterality: N/A;   colonoscopy     INTERCOSTAL NERVE BLOCK  10/23/2021   Procedure: INTERCOSTAL NERVE BLOCK;  Surgeon: Lajuana Matte, MD;  Location: [place];  Service: Thoracic;;  NODE DISSECTION  10/23/2021   Procedure: NODE DISSECTION;  Surgeon: Lajuana Matte, MD;  Location: [place];  Service: Thoracic;;   VIDEO BRONCHOSCOPY WITH RADIAL ENDOBRONCHIAL ULTRASOUND  07/24/2021   Procedure: VIDEO BRONCHOSCOPY WITH RADIAL ENDOBRONCHIAL ULTRASOUND;  Surgeon: Collene Gobble, MD;  Location: [facility] ENDOSCOPY;  Service: Pulmonary;;    Current Medications: Current Outpatient Medications on File Prior to Visit  Medication Sig Dispense Refill   apixaban (ELIQUIS) 5 MG TABS tablet Take 5 mg by mouth 2 (two) times daily.     atorvastatin (LIPITOR) 40 MG tablet Take  40 mg by mouth daily.     bisacodyl (DULCOLAX) 5 MG EC tablet Take 5 mg by mouth daily as needed for moderate constipation.     carvedilol (COREG) 6.25 MG tablet Take 1 tablet (6.25 mg total) by mouth 2 (two) times daily. 60 tablet 1   dapagliflozin propanediol (FARXIGA) 10 MG TABS tablet Take 1 tablet (10 mg total) by mouth daily. 30 tablet 1   furosemide (LASIX) 40 MG tablet Take 1 tablet (40 mg total) by mouth daily. 30 tablet 1   irbesartan (AVAPRO) 150 MG tablet Take 0.5 tablets (75 mg total) by mouth daily.     loratadine (CLARITIN) 10 MG tablet Take 10 mg by mouth daily as needed for allergies.     ondansetron (ZOFRAN-ODT) 4 MG disintegrating tablet Take 1 tablet (4 mg total) by mouth every 8 (eight) hours as needed for nausea or vomiting. 20 tablet 0   OXYGEN Inhale 2 L into the lungs.     Vitamin D, Ergocalciferol, (DRISDOL) 1.25 MG (50000 UNIT) CAPS capsule Take 50,000 Units by mouth every Sunday.     No current facility-administered medications on file prior to visit.     Allergies:   Ivp dye [iodinated contrast media]   Social History   Socioeconomic History   Marital status: Married    Spouse name: Not on file   Number of children: Not on file   Years of education: Not on file   Highest education level: Not on file  Occupational History   Not on file  Tobacco Use   Smoking status: Never    Passive exposure: Never   Smokeless tobacco: Never  Vaping Use   Vaping Use: Never used  Substance and Sexual Activity   Alcohol use: Not Currently   Drug use: Not Currently   Sexual activity: Not Currently  Other Topics Concern   Not on file  Social History Narrative   Not on file   Social Determinants of Health   Financial Resource Strain: Not on file  Food Insecurity: Not on file  Transportation Needs: Not on file  Physical Activity: Not on file  Stress: Not on file  Social Connections: Not on file     Family History: The patient's family history includes  Aneurysm in his sister; Diabetes in his mother and sister; Pancreatic cancer in his sister; Stroke in his father.  ROS:   Please see the history of present illness.     All other systems reviewed and are negative.  EKGs/Labs/Other Studies Reviewed:    The following studies were reviewed today:   EKG:  EKG is  ordered today.  The ekg ordered today demonstrates   Atrial flutter with variable AV block 74 bpm  11/23/2021- atrial flutter 4:1 AV block  Recent Labs: 07/06/2021: Magnesium 2.4; TSH 1.119 10/25/2021: ALT 18 10/26/2021: Hemoglobin 12.7; Platelets 160 11/09/2021: BUN 26; Creatinine, Ser 1.57; Potassium 5.4; Sodium 137  Recent Lipid Panel    Component Value Date/Time   CHOL 157 07/07/2021 0424   TRIG 109 07/07/2021 0424   HDL 41 07/07/2021 0424   CHOLHDL 3.8 07/07/2021 0424   VLDL 22 07/07/2021 0424   LDLCALC 94 07/07/2021 0424     Risk Assessment/Calculations:           Physical Exam:    VS:    Vitals:   11/23/21 0959  BP: (!) 146/94  Pulse: 74  SpO2: 100%      Wt Readings from Last 3 Encounters:  11/23/21 243 lb 6.4 oz (110.4 kg)  11/17/21 243 lb (110.2 kg)  10/23/21 250 lb (113.4 kg)     GEN:  Well nourished, well developed in no acute distress. Wearing supplemental O2 HEENT: Normal NECK: No JVD; No carotid bruits LYMPHATICS: No lymphadenopathy CARDIAC: RRR, no murmurs, rubs, gallops RESPIRATORY:  Clear to auscultation without rales, wheezing or rhonchi  ABDOMEN: Soft, non-tender, non-distended MUSCULOSKELETAL:  No edema; No deformity  SKIN: Warm and dry NEUROLOGIC:  Alert and oriented x 3 PSYCHIATRIC:  Normal affect   ASSESSMENT:    Atrial Flutter: variable AV block. new onset March 2023.  Flutter looks more atypical unlikely amenable to CTI line. He is rate controlled. DCCV May 2023 successful. Has had recurrence post surgery. Recommended pulm for sleep study last visit. Discussed repeat DCCV, he was hesitant. I said it was reasonable to  continue his medications for now.  He is rate controlled. If develops symptoms will plan DCCV and EP referral for consideration of AAD/ablation - continue eliquis 5 mg BID - continue coreg 6.25 mg BID  HTN: goal BP closer 120/80 mmHg with aneurysm. Was on norvasc 10 mg , held with LE edema. On irbesartan  75 mg daily. Started carvedilol 25 mg BID in May 2023; decreased to 6.25 mg BID in the hospital.   HLD: atorvastatin 40 mg daily  Aortic ascending aneurysm 4.8 cm 06/2021, can repeat in 6 months. Will repeat with CT  CKD 3: ~Crt 1.4. Stable  CardioOnc: with carcinoid tumor, will assess for symptoms of right sided valve dx with low threshold for repeat TTE. He is planned to see Dr. Curt Bears.  PLAN:    In order of problems listed above:   Repeat Chest CT w/o contrast for thoracic aneurysm Follow up 6 mo      Medication Adjustments/Labs and Tests Ordered: Current medicines are reviewed at length with the patient today.  Concerns regarding medicines are outlined above.  Orders Placed This Encounter  Procedures   CT Chest Wo Contrast   EKG 12-Lead   No orders of the defined types were placed in this encounter.   Patient Instructions  Medication Instructions:  No Changes In Medications at this time.  *If you need a refill on your cardiac medications before your next appointment, please call your pharmacy*  Lab Work: None Ordered At This Time.  If you have labs (blood work) drawn today and your tests are completely normal, you will receive your results only by: [facility] (if you have MyChart) OR A paper copy in the mail If you have any lab test that is abnormal or we need to change your treatment, we will call you to review the results.  Testing/Procedures: CHEST CT WITH NO CONTRAST- IN 2 MONTHS  Follow-Up: At [facility], you and your health needs are our priority.  As part of our continuing mission to provide you with exceptional heart care, we have  created designated Provider Care Teams.  These Care Teams include your primary Cardiologist (physician) and Advanced Practice Providers (APPs -  Physician Assistants and Nurse Practitioners) who all work together to provide you with the care you need, when you need it.  Your next appointment:   6 month(s)  The format for your next appointment:   In Person  Provider:   Janina Mayo, MD          Signed, Janina Mayo, MD  11/23/2021 2:04 PM    [place]

## 2021-11-27 ENCOUNTER — Other Ambulatory Visit: Payer: Self-pay

## 2021-11-27 ENCOUNTER — Telehealth: Payer: Self-pay | Admitting: Internal Medicine

## 2021-11-27 DIAGNOSIS — C7A090 Malignant carcinoid tumor of the bronchus and lung: Secondary | ICD-10-CM

## 2021-11-27 NOTE — Telephone Encounter (Signed)
*  STAT* If patient is at the pharmacy, call can be transferred to refill team.   1. Which medications need to be refilled? (please list name of each medication and dose if known) carvedilol (COREG) 6.25 MG tablet  2. Which pharmacy/location (including street and city if local pharmacy) is medication to be sent to? [id number] [place], [place] - [place]  3. Do they need a 30 day or 90 day supply? 90  Pt called stating the pharmacy refilled this medication at 25 mg tablets, but it should be take 1 tablet (6.25 mg total) by mouth 2 (two) times daily

## 2021-11-28 ENCOUNTER — Inpatient Hospital Stay: Payer: Medicare HMO

## 2021-11-28 ENCOUNTER — Other Ambulatory Visit (HOSPITAL_COMMUNITY): Payer: Self-pay

## 2021-11-28 ENCOUNTER — Inpatient Hospital Stay: Payer: Medicare HMO | Admitting: Internal Medicine

## 2021-11-28 ENCOUNTER — Telehealth: Payer: Self-pay | Admitting: Internal Medicine

## 2021-11-28 ENCOUNTER — Other Ambulatory Visit: Payer: Self-pay | Admitting: Internal Medicine

## 2021-11-28 MED ORDER — IRBESARTAN 150 MG PO TABS: 75.0000 mg | ORAL_TABLET | Freq: Every day | ORAL | 3 refills | Status: DC

## 2021-11-28 MED ORDER — CARVEDILOL 6.25 MG PO TABS: 6.2500 mg | ORAL_TABLET | Freq: Two times a day (BID) | ORAL | 3 refills | Status: DC

## 2021-11-28 NOTE — Telephone Encounter (Signed)
*  STAT* If patient is at the pharmacy, call can be transferred to refill team.   1. Which medications need to be refilled? (please list name of each medication and dose if known)  irbesartan (AVAPRO) 150 MG tablet   2. Which pharmacy/location (including street and city if local pharmacy) is medication to be sent to? [id number] - [place]  - [place]  3. Do they need a 30 day or 90 day supply? 30 day

## 2021-11-30 ENCOUNTER — Encounter (HOSPITAL_COMMUNITY): Payer: Self-pay

## 2021-11-30 ENCOUNTER — Other Ambulatory Visit: Payer: Self-pay

## 2021-11-30 ENCOUNTER — Ambulatory Visit (HOSPITAL_COMMUNITY): Admit: 2021-11-30 | Payer: Medicare HMO | Admitting: Internal Medicine

## 2021-11-30 DIAGNOSIS — Z79899 Other long term (current) drug therapy: Secondary | ICD-10-CM

## 2021-11-30 SURGERY — CARDIOVERSION
Anesthesia: Monitor Anesthesia Care

## 2021-12-01 ENCOUNTER — Telehealth: Payer: Self-pay | Admitting: Internal Medicine

## 2021-12-01 NOTE — Telephone Encounter (Signed)
*  STAT* If patient is at the pharmacy, call can be transferred to refill team.   1. Which medications need to be refilled? (please list name of each medication and dose if known) furosemide (LASIX) 40 MG tablet  2. Which pharmacy/location (including street and city if local pharmacy) is medication to be sent to? [id number] [place], [place]  3. Do they need a 30 day or 90 day supply? 90 day  Patient only has 1 pill left.

## 2021-12-04 DIAGNOSIS — Z79899 Other long term (current) drug therapy: Secondary | ICD-10-CM | POA: Diagnosis not present

## 2021-12-05 LAB — BASIC METABOLIC PANEL
BUN/Creatinine Ratio: 18 (ref 10–24)
BUN: 28 mg/dL — ABNORMAL HIGH (ref 8–27)
CO2: 26 mmol/L (ref 20–29)
Calcium: 9.4 mg/dL (ref 8.6–10.2)
Chloride: 100 mmol/L (ref 96–106)
Creatinine, Ser: 1.58 mg/dL — ABNORMAL HIGH (ref 0.76–1.27)
Glucose: 157 mg/dL — ABNORMAL HIGH (ref 70–99)
Potassium: 5.1 mmol/L (ref 3.5–5.2)
Sodium: 143 mmol/L (ref 134–144)
eGFR: 47 mL/min/{1.73_m2} — ABNORMAL LOW (ref >59)

## 2021-12-06 ENCOUNTER — Encounter: Payer: Self-pay | Admitting: *Deleted

## 2021-12-06 NOTE — Progress Notes (Signed)
I called and spoke to Mr. Rokosz to remind him of his appointments tomorrow and to arrive 15 minutes before his appointment.  He verbalized understanding and denied any questions.

## 2021-12-07 ENCOUNTER — Other Ambulatory Visit: Payer: Self-pay

## 2021-12-07 ENCOUNTER — Inpatient Hospital Stay: Payer: Medicare HMO | Attending: Internal Medicine | Admitting: Internal Medicine

## 2021-12-07 ENCOUNTER — Inpatient Hospital Stay: Payer: Medicare HMO

## 2021-12-07 VITALS — BP 151/103 | HR 72 | Temp 98.0°F | Resp 16 | Ht 72.0 in | Wt 243.2 lb

## 2021-12-07 DIAGNOSIS — Z902 Acquired absence of lung [part of]: Secondary | ICD-10-CM | POA: Diagnosis not present

## 2021-12-07 DIAGNOSIS — C7A090 Malignant carcinoid tumor of the bronchus and lung: Secondary | ICD-10-CM

## 2021-12-07 NOTE — Progress Notes (Signed)
[place] [phone]   [phone]  CONSULT NOTE  REFERRING PHYSICIAN: Dr. Melodie Bouillon  REASON FOR CONSULTATION:  71 years old African-American male recently diagnosed with carcinoid tumor  HPI Chris Lucas is a 71 y.o. male with past medical history significant for diabetes mellitus, hypertension, dyslipidemia, dysrhythmia, atrial flutter, stroke as well as thoracic ascending aortic aneurysm and renal insufficiency.  The patient mentions that in the end of March 2023 he has been complaining of frequent nausea and then weakness in his upper extremities.  He was seen at one of the urgent care center and was transferred to [facility] for further evaluation.  He had MRI of the brain on July 06, 2021 and it showed 4 mm acute ischemic nonhemorrhagic infarct involving the inferior right cerebellum.  CT scan of the chest performed at that time showed macrolobulated solid mass in the left upper lobe abutting the major fissure and measuring 3.5 x 3.1 x 2.8 cm highly concerning for pulmonary neoplasm.  He was seen by Dr. Lamonte Sakai and underwent video bronchoscopy with robotic assisted bronchoscopic navigation on 07/24/2021 and the final pathology was consistent with carcinoid tumor.  On Aug 31, 2021 the patient had PET dotatate scan and it showed intensely radiotracer avid left upper lobe pulmonary mass consistent with well-differentiated neuroendocrine tumor.  There was no evidence of metastatic adenopathy in the mediastinum and no evidence of distant metastatic well differentiated neuroendocrine tumor.  The patient was referred to Dr. Kipp Brood and on 10/23/2021 he underwent robotic assisted left video thoracoscopy with left upper lobectomy and mediastinal lymph node sampling.  The final pathology [id number]) showed typical carcinoid measuring 3.2 cm in greatest dimension. confirmatory immunohistochemical stains (synaptophysin, chromogranin and CD56)  were performed on the biopsy ([id number]) Essentially absent mitoses and negative for necrosis, consistent with typical carcinoid. The patient was referred to me today for evaluation and recommendation regarding treatment of his condition. When seen today he is feeling fine with no concerning complaints except for some tightness in the chest and changes in the shape of the left side of his chest after the surgical resection.  He has no cough, shortness of breath or hemoptysis.  He has no nausea, vomiting, diarrhea or constipation.  He lost several pounds after his surgery.  He has no headache or visual changes. Family history significant for father with a stroke.  Mother had diabetes and sister had pancreatic cancer. The patient is married and has 1 stepson.  He was accompanied today by his wife Aral.  He is currently retired and used to do transportation for hemodialysis patient.  He has no history of smoking, alcohol or drug abuse.   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   solid   Pneumonia    Pre-diabetes    diet controlled   Stroke ([place]) 07/06/2021   Thoracic ascending aortic aneurysm [facility])     Past Surgical History:  Procedure Laterality Date   BRONCHIAL BIOPSY  07/24/2021   Procedure: BRONCHIAL BIOPSIES;  Surgeon: Collene Gobble, MD;  Location: [facility] ENDOSCOPY;  Service: Pulmonary;;   BRONCHIAL BRUSHINGS  07/24/2021   Procedure: BRONCHIAL BRUSHINGS;  Surgeon: Collene Gobble, MD;  Location: [facility] ENDOSCOPY;  Service: Pulmonary;;   BRONCHIAL NEEDLE ASPIRATION BIOPSY  07/24/2021   Procedure: BRONCHIAL NEEDLE ASPIRATION BIOPSIES;  Surgeon: Collene Gobble, MD;  Location: [place] ENDOSCOPY;  Service: Pulmonary;;   CARDIAC CATHETERIZATION     "Years ago"  (apporx 18-20 yrs)   CARDIOVERSION N/A 09/01/2021    Procedure: CARDIOVERSION;  Surgeon: [name], MD;  Location: [facility] ENDOSCOPY;  Service: Cardiovascular;  Laterality: N/A;   colonoscopy     INTERCOSTAL NERVE BLOCK  10/23/2021   Procedure: INTERCOSTAL NERVE BLOCK;  Surgeon: Lajuana Matte, MD;  Location: [place];  Service: Thoracic;;   NODE DISSECTION  10/23/2021   Procedure: NODE DISSECTION;  Surgeon: Lajuana Matte, MD;  Location: [place];  Service: Thoracic;;   VIDEO BRONCHOSCOPY WITH RADIAL ENDOBRONCHIAL ULTRASOUND  07/24/2021   Procedure: VIDEO BRONCHOSCOPY WITH RADIAL ENDOBRONCHIAL ULTRASOUND;  Surgeon: Collene Gobble, MD;  Location: [facility] ENDOSCOPY;  Service: Pulmonary;;    Family History  Problem Relation Age of Onset   Diabetes Mother    Stroke Father    Diabetes Sister    Aneurysm Sister    Pancreatic cancer Sister     Social History Social History   Tobacco Use   Smoking status: Never    Passive exposure: Never   Smokeless tobacco: Never  Vaping Use   Vaping Use: Never used  Substance Use Topics   Alcohol use: Not Currently   Drug use: Not Currently    Allergies  Allergen Reactions   Ivp Dye [Iodinated Contrast Media] Shortness Of Breath and Rash    Current Outpatient Medications  Medication Sig Dispense Refill   apixaban (ELIQUIS) 5 MG TABS tablet Take 5 mg by mouth 2 (two) times daily.     atorvastatin (LIPITOR) 40 MG tablet Take 40 mg by mouth daily.     bisacodyl (DULCOLAX) 5 MG EC tablet Take 5 mg by mouth daily as needed for moderate constipation.     carvedilol (COREG) 6.25 MG tablet TAKE 1 TABLET BY MOUTH TWICE A DAY 180 tablet 3   dapagliflozin propanediol (FARXIGA) 10 MG TABS tablet Take 1 tablet (10 mg total) by mouth daily. 30 tablet 1   furosemide (LASIX) 40 MG tablet Take 1 tablet (40 mg total) by mouth daily. 30 tablet 1   irbesartan (AVAPRO) 150 MG tablet Take 0.5 tablets (75 mg total) by mouth daily. 90 tablet 3   loratadine (CLARITIN) 10 MG tablet Take 10 mg by mouth daily as needed  for allergies.     ondansetron (ZOFRAN-ODT) 4 MG disintegrating tablet Take 1 tablet (4 mg total) by mouth every 8 (eight) hours as needed for nausea or vomiting. 20 tablet 0   OXYGEN Inhale 2 L into the lungs.     Vitamin D, Ergocalciferol, (DRISDOL) 1.25 MG (50000 UNIT) CAPS capsule Take 50,000 Units by mouth every Sunday.     No current facility-administered medications for this visit.    Review of Systems  Constitutional: positive for fatigue and weight loss Eyes: negative Ears, nose, mouth, throat, and face: negative Respiratory: positive for pleurisy/chest pain Cardiovascular: negative Gastrointestinal: negative Genitourinary:negative Integument/breast: negative Hematologic/lymphatic: negative Musculoskeletal:negative Neurological: negative Behavioral/Psych: negative Endocrine: negative Allergic/Immunologic: negative  Physical Exam  HGD:JMEQA, healthy, no distress, well nourished, and well developed SKIN: skin color, texture, turgor are normal, no rashes or significant lesions HEAD: Normocephalic, No masses, lesions, tenderness or abnormalities EYES: normal, PERRLA, Conjunctiva are pink and non-injected EARS: External ears normal, Canals clear OROPHARYNX:no exudate, no erythema, and lips, buccal mucosa, and tongue normal  NECK: supple, no adenopathy, no JVD LYMPH:  no palpable lymphadenopathy, no hepatosplenomegaly LUNGS: clear to auscultation , and palpation HEART: regular rate & rhythm, no murmurs, and no gallops ABDOMEN:abdomen soft, non-tender, normal bowel sounds, and no masses or organomegaly BACK: Back symmetric, no curvature., No CVA tenderness EXTREMITIES:no joint deformities, effusion, or inflammation, no edema  NEURO: alert & oriented x 3 with fluent speech, no focal motor/sensory deficits  PERFORMANCE STATUS: ECOG 1  LABORATORY DATA: Lab Results  Component Value Date   WBC 10.3 10/26/2021   HGB 12.7 (L) 10/26/2021   HCT 40.7 10/26/2021   MCV 99.8  10/26/2021   PLT 160 10/26/2021      Chemistry      Component Value Date/Time   NA 143 12/04/2021 1523   K 5.1 12/04/2021 1523   CL 100 12/04/2021 1523   CO2 26 12/04/2021 1523   BUN 28 (H) 12/04/2021 1523   CREATININE 1.58 (H) 12/04/2021 1523      Component Value Date/Time   CALCIUM 9.4 12/04/2021 1523   ALKPHOS 80 10/25/2021 0042   AST 24 10/25/2021 0042   ALT 18 10/25/2021 0042   BILITOT 1.0 10/25/2021 0042       RADIOGRAPHIC STUDIES: DG Chest 2 View  Result Date: 11/17/2021 CLINICAL DATA:  s/p lobectomy of lung EXAM: CHEST - 2 VIEW COMPARISON:  Chest x-ray 10/29/2021. FINDINGS: Similar left basilar opacities with elevated left hemidiaphragm. Possible small left pleural effusion. No visible pneumothorax. Right lung is clear. Cardiomediastinal silhouette is partly obscured but appears similar. No evidence of acute osseous abnormality. IMPRESSION: Similar left basilar opacities with elevated left hemidiaphragm. Electronically Signed   By: Margaretha Sheffield M.D.   On: 11/17/2021 10:02    ASSESSMENT: This is a very pleasant 71 years old African-American male diagnosed with stage Ib (T2 a, N0, M0) typical carcinoid tumor presented with left upper lobe lung mass status post left upper lobectomy with lymph node sampling under the care of Dr. Kipp Brood on October 23, 2021.   PLAN: I had a lengthy discussion with the patient and his wife today about his current disease stage, prognosis and treatment options.  I personally and independently reviewed the scan images as well as the pathology report and discussed the results with the patient and his wife.   I explained to the patient that there is no survival benefit for adjuvant systemic chemotherapy or radiation for patient with resected stage I carcinoid tumor and the current standard of care is observation. I recommended for the patient to have repeat CT scan of the chest in 6 months and if no evidence for disease recurrence, I will see him  on an annual basis after that. The patient was advised to call immediately if he has any other concerning symptoms in the interval. The patient voices understanding of current disease status and treatment options and is in agreement with the current care plan.  All questions were answered. The patient knows to call the clinic with any problems, questions or concerns. We can certainly see the patient much sooner if necessary.  Thank you so much for allowing me to participate in the care of Chris Lucas. I will continue to follow up the patient with you and assist in his care.  The total time spent in the appointment was 60 minutes.  Disclaimer: This note was dictated with voice recognition software. Similar sounding words can inadvertently be transcribed and may not be corrected upon review.   Eilleen Kempf December 07, 2021, 2:50 PM

## 2021-12-08 ENCOUNTER — Telehealth: Payer: Self-pay | Admitting: Medical Oncology

## 2021-12-08 NOTE — Telephone Encounter (Signed)
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

## 2021-12-14 ENCOUNTER — Other Ambulatory Visit: Payer: Self-pay | Admitting: Thoracic Surgery (Cardiothoracic Vascular Surgery)

## 2021-12-14 DIAGNOSIS — I10 Essential (primary) hypertension: Secondary | ICD-10-CM | POA: Diagnosis not present

## 2021-12-14 DIAGNOSIS — I4892 Unspecified atrial flutter: Secondary | ICD-10-CM | POA: Diagnosis not present

## 2021-12-14 DIAGNOSIS — Z902 Acquired absence of lung [part of]: Secondary | ICD-10-CM

## 2021-12-14 DIAGNOSIS — E782 Mixed hyperlipidemia: Secondary | ICD-10-CM | POA: Diagnosis not present

## 2021-12-14 DIAGNOSIS — R809 Proteinuria, unspecified: Secondary | ICD-10-CM | POA: Diagnosis not present

## 2021-12-14 DIAGNOSIS — E559 Vitamin D deficiency, unspecified: Secondary | ICD-10-CM | POA: Diagnosis not present

## 2021-12-14 DIAGNOSIS — R972 Elevated prostate specific antigen [PSA]: Secondary | ICD-10-CM | POA: Diagnosis not present

## 2021-12-14 DIAGNOSIS — E118 Type 2 diabetes mellitus with unspecified complications: Secondary | ICD-10-CM | POA: Diagnosis not present

## 2021-12-14 DIAGNOSIS — E669 Obesity, unspecified: Secondary | ICD-10-CM | POA: Diagnosis not present

## 2021-12-14 DIAGNOSIS — N1831 Chronic kidney disease, stage 3a: Secondary | ICD-10-CM | POA: Diagnosis not present

## 2021-12-15 ENCOUNTER — Ambulatory Visit
Admission: RE | Admit: 2021-12-15 | Discharge: 2021-12-15 | Disposition: A | Discharge status: Home / Self Care | Payer: Medicare HMO | Source: Ambulatory Visit | Attending: Thoracic Surgery (Cardiothoracic Vascular Surgery) | Admitting: Thoracic Surgery (Cardiothoracic Vascular Surgery)

## 2021-12-15 ENCOUNTER — Ambulatory Visit (INDEPENDENT_AMBULATORY_CARE_PROVIDER_SITE_OTHER): Payer: Self-pay | Admitting: Thoracic Surgery (Cardiothoracic Vascular Surgery)

## 2021-12-15 VITALS — BP 147/101 | HR 75 | Ht 72.0 in | Wt 239.0 lb

## 2021-12-15 DIAGNOSIS — Z902 Acquired absence of lung [part of]: Secondary | ICD-10-CM

## 2021-12-15 DIAGNOSIS — R918 Other nonspecific abnormal finding of lung field: Secondary | ICD-10-CM | POA: Diagnosis not present

## 2021-12-15 DIAGNOSIS — Z9889 Other specified postprocedural states: Secondary | ICD-10-CM | POA: Diagnosis not present

## 2021-12-15 NOTE — Progress Notes (Signed)
      [address]       [place] [address]             [phone]        Chris Lucas [facility] Medical Record #[id number] Date of Birth: 09-Feb-1951  Referring: Chris Squibb, MD Primary Care: Chris Squibb, MD Primary Cardiologist:Branch, Chris Crochet, MD  Reason for visit:   follow-up  History of Present Illness:     Mr. Chris Lucas comes in for his 1 month follow-up appointment.  Overall he is doing well.  He continues to require 2 L of oxygen while at home.  He is working with physical therapy.  Physical Exam: BP (!) 147/101 (BP Location: Right Arm, Patient Position: Sitting)   Pulse 75   Ht 6' (1.829 m)   Wt 239 lb (108.4 kg)   SpO2 97% Comment: 2L o2  BMI 32.41 kg/m   Alert NAD Abdomen, ND No peripheral edema  6 Minute walk test:   Baseline VS   BP 147/101 HR 75 SpO02 97% on 2L O2   Patient ambulated a total of 768 ft with SpO2 dropping to 88-89% on RA. Patient walked at steady pace without c/o SOB.   Diagnostic Studies & Laboratory data: CXR: Small left effusion    Assessment / Plan:   71 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  He did well from most of the 6-minute walk test however he did become tachycardic towards the end.  He denied any shortness of breath.  Instructed him to decrease his supplemental oxygen to 1 L with activity.  I will see him back in 2 weeks for another 6-minute walk test.   Chris Lucas 12/15/2021 12:55 PM

## 2021-12-15 NOTE — Progress Notes (Signed)
6 Minute walk test:  Baseline VS  BP 147/101 HR 75 SpO02 97% on 2L O2  Patient ambulated a total of 768 ft with SpO2 dropping to 88-89% on RA. Patient walked at steady pace without c/o SOB.

## 2021-12-27 ENCOUNTER — Ambulatory Visit (INDEPENDENT_AMBULATORY_CARE_PROVIDER_SITE_OTHER): Payer: Medicare HMO | Admitting: Emergency Medicine

## 2021-12-27 ENCOUNTER — Encounter: Payer: Self-pay | Admitting: Emergency Medicine

## 2021-12-27 DIAGNOSIS — J449 Chronic obstructive pulmonary disease, unspecified: Secondary | ICD-10-CM

## 2021-12-27 DIAGNOSIS — D3A090 Benign carcinoid tumor of the bronchus and lung: Secondary | ICD-10-CM

## 2021-12-27 MED ORDER — ALBUTEROL SULFATE HFA 108 (90 BASE) MCG/ACT IN AERS: 2.0000 | INHALATION_SPRAY | Freq: Four times a day (QID) | RESPIRATORY_TRACT | 6 refills | Status: AC | PRN

## 2021-12-27 NOTE — Assessment & Plan Note (Addendum)
Diagnosed by navigational bronchoscopy, successful robotic VATS lobectomy.  Doing well but required supplemental oxygen immediately postop.  He still has 1 L/min to use with exertion.  A repeat 6-minute walk test is planned for this month.  Predict he will be able to wean off. He will get a 71-month repeat CT scan of the chest with Dr. Julien Nordmann to ensure no evidence of recurrent disease.  At that point since this was a benign carcinoid, he may not need any further dedicated follow-up imaging.

## 2021-12-27 NOTE — Assessment & Plan Note (Signed)
Mixed restriction and obstruction.  Improving functional capacity and no overt symptoms of COPD/fixed asthma.  We talked about the pros and cons of having albuterol available to use as needed.  He is willing to try this.  If he benefits then we can consider possible scheduled BD therapy.  If his breathing stabilizes and he is not requiring albuterol then we can probably defer follow-up.  I did explain this to him today.

## 2021-12-27 NOTE — Progress Notes (Signed)
Subjective:    Patient ID: Chris Lucas, male    DOB: 07-Jun-1950, 71 y.o.   MRN: [id number]  HPI  ROV 08/24/21 --Chris Lucas is a 71 year old gentleman with history of hypertension.  He is a never smoker.  I saw him for an abnormal CT scan of the chest done 07/06/2021.  He underwent bronchoscopy identified typical carcinoid tumor.  He has since seen Chris Lucas with thoracic surgery and is being considered for possible surgical resection, nuclear scanning to look for any evidence of distant disease.  He underwent pulmonary function testing today as below.  He is not on any bronchodilator therapy  Sniff test 08/23/2021 reviewed by me showed moderate left HD elevation (similar to 2008) with mild to moderate paresis relative to the right but no paralysis.  Pulmonary function testing performed today and reviewed by me show mixed obstruction and restriction with a significantly decreased FEV1 56% predicted.  Significant hyperinflation on lung volumes consistent with emphysema.  Decreased diffusion capacity that corrects to normal range when adjusted for his alveolar volume.  ROV 12/27/2021 --71 year old man, never smoker with a history of hypertension.  I saw him originally for an abnormal CT scan of the chest.  He went underwent bronchoscopy and was found to have a typical carcinoid tumor.  A PET dotatate scan 08/31/2021 showed uptake in the left upper lobe mass without any evidence of metastatic adenopathy in the mediastinum or any distant disease.  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  He was discharged on supplemental oxygen.  He had a repeat 6-minute walk test 12/15/2021 at [facility], walked 768 feet with lowest SPO2 88% on room air.  He was continued on 1 L/min with activity, hopefully will be able to wean off.  He is not on any bronchodilator regimen. He denies any SOB, feels that his functional capacity is improving.     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   solid   Pneumonia    Pre-diabetes    diet controlled   Stroke [facility]) 07/06/2021   Thoracic ascending aortic aneurysm ([place])      Family History  Problem Relation Age of Onset   Diabetes Mother    Stroke Father    Diabetes Sister    Aneurysm Sister    Pancreatic cancer Sister      Social History   Socioeconomic History   Marital status: Married    Spouse name: Not on file   Number of children: Not on file   Years of education: Not on file   Highest education level: Not on file  Occupational History   Not on file  Tobacco Use   Smoking status: Never    Passive exposure: Never   Smokeless tobacco: Never  Vaping Use   Vaping Use: Never used  Substance and Sexual Activity   Alcohol use: Not Currently   Drug use: Not Currently   Sexual activity: Not Currently  Other Topics Concern   Not on file  Social History Narrative   Not on file   Social Determinants of Health   Financial Resource Strain: Not on file  Food Insecurity: Not on file  Transportation Needs: Not on  file  Physical Activity: Not on file  Stress: Not on file  Social Connections: Not on file  Intimate Partner Violence: Not on file     Allergies  Allergen Reactions   Ivp Dye [Iodinated Contrast Media] Shortness Of Breath and Rash     Outpatient Medications Prior to Visit  Medication Sig Dispense Refill   apixaban (ELIQUIS) 5 MG TABS tablet Take 5 mg by mouth 2 (two) times daily.     atorvastatin (LIPITOR) 40 MG tablet Take 40 mg by mouth daily.     bisacodyl (DULCOLAX) 5 MG EC tablet Take 5 mg by mouth daily as needed for moderate constipation.     carvedilol (COREG) 6.25 MG tablet TAKE 1 TABLET BY MOUTH TWICE A DAY 180 tablet 3   dapagliflozin  propanediol (FARXIGA) 10 MG TABS tablet Take 1 tablet (10 mg total) by mouth daily. 30 tablet 1   furosemide (LASIX) 40 MG tablet Take 1 tablet (40 mg total) by mouth daily. 30 tablet 1   irbesartan (AVAPRO) 150 MG tablet Take 0.5 tablets (75 mg total) by mouth daily. 90 tablet 3   loratadine (CLARITIN) 10 MG tablet Take 10 mg by mouth daily as needed for allergies.     ondansetron (ZOFRAN-ODT) 4 MG disintegrating tablet Take 1 tablet (4 mg total) by mouth every 8 (eight) hours as needed for nausea or vomiting. 20 tablet 0   OXYGEN Inhale 2 L into the lungs.     Vitamin D, Ergocalciferol, (DRISDOL) 1.25 MG (50000 UNIT) CAPS capsule Take 50,000 Units by mouth every Sunday.     No facility-administered medications prior to visit.         Objective:   Physical Exam Vitals:   12/27/21 1011  BP: (!) 142/78  Pulse: 71  Temp: 97.7 F (36.5 C)  TempSrc: Oral  SpO2: 98%  Weight: 240 lb 3.2 oz (109 kg)  Height: 6\' 1"  (1.854 m)   Gen: Pleasant, well-nourished, in no distress,  normal affect  ENT: No lesions,  mouth clear,  oropharynx clear, no postnasal drip  Neck: No JVD, no stridor  Lungs: No use of accessory muscles, no crackles or wheezing on normal respiration, no wheeze on forced expiration  Cardiovascular: RRR, heart sounds normal, no murmur or gallops, no peripheral edema  Musculoskeletal: No deformities, no cyanosis or clubbing  Neuro: alert, awake, non focal  Skin: Warm, no lesions or rash      Assessment & Plan:  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  Doing well but required supplemental oxygen immediately postop.  He still has 1 L/min to use with exertion.  A repeat 6-minute walk test is planned for this month.  Predict he will be able to wean off. He will get a 58-month repeat CT scan of the chest with Dr. Julien Lucas to ensure no evidence of recurrent disease.  At that point since this was a benign carcinoid, he may  not need any further dedicated follow-up imaging.  COPD (chronic obstructive pulmonary disease) (HCC) Mixed restriction and obstruction.  Improving functional capacity and no overt symptoms of COPD/fixed asthma.  We talked about the pros and cons of having albuterol available to use as needed.  He is willing to try this.  If he benefits then we can consider possible scheduled BD therapy.  If his breathing stabilizes and he is not requiring albuterol then we can probably defer follow-up.  I did explain this to him today.   Baltazar Apo, MD,  PhD 12/27/2021, 10:32 AM [facility] [phone] or if no answer before 7:00PM call [phone] For any issues after 7:00PM please call eLink [phone]

## 2021-12-27 NOTE — Patient Instructions (Signed)
We reviewed your thoracic surgery and oncology notes today. Continue oxygen at 1 L/min.  Follow-up with Dr. Kipp Brood for your repeat 6-minute walk test.  Hopefully you will be able to come off oxygen altogether at that time. We will give you a prescription for albuterol.  You can use 2 puffs if you feel that you need it for shortness of breath, chest tightness, wheezing.  You might want to try taking it about 10 to 15 minutes prior to heavier exertion to see if it helps your breathing. Follow with Dr. Lamonte Sakai in 1 year to assess your breathing status.  If you are doing well at that time then we could defer follow-up.  Call sooner if you have any new problems.

## 2021-12-27 NOTE — Addendum Note (Signed)
Addended by: Gavin Potters R on: 12/27/2021 10:49 AM   Modules accepted: Orders

## 2021-12-29 ENCOUNTER — Ambulatory Visit (INDEPENDENT_AMBULATORY_CARE_PROVIDER_SITE_OTHER): Payer: Self-pay | Admitting: Thoracic Surgery (Cardiothoracic Vascular Surgery)

## 2021-12-29 VITALS — BP 179/99 | HR 64 | Resp 18 | Ht 73.0 in | Wt 240.0 lb

## 2021-12-29 DIAGNOSIS — Z902 Acquired absence of lung [part of]: Secondary | ICD-10-CM

## 2021-12-29 DIAGNOSIS — D3A090 Benign carcinoid tumor of the bronchus and lung: Secondary | ICD-10-CM

## 2021-12-29 NOTE — Progress Notes (Signed)
     [address]       [place] [address]             [phone]       Chris Lucas comes in for his 6-minute walk test.  He states that he has been doing better at home with ambulation.  He is not using his oxygen consistently.   6 Minute walk test:   Baseline VS   BP 177/112 HR 65 SpO02 94% on room air   Patient ambulated 8 laps with a total of 768 ft with SpO2 maintaining above 90% RA. Patient walked at steady pace without c/o SOB.    Post VS   BP 179/99 HR 64 SpO2 93% RA  Based off of his 6-minute walk test I do not think that he needs any more supplemental oxygen.  I have instructed him to keep the oxygen available for the next week.  He will continue to measure his sats.  If he stays above 88% then this can be discontinued completely.  Lilyth Lawyer [name]

## 2021-12-29 NOTE — Progress Notes (Signed)
6 Minute walk test:   Baseline VS   BP 177/112 HR 65 SpO02 94% on room air   Patient ambulated 8 laps with a total of 768 ft with SpO2 maintaining above 90% RA. Patient walked at steady pace without c/o SOB.   Post VS  BP 179/99 HR 64 SpO2 93% RA

## 2022-01-19 DIAGNOSIS — E118 Type 2 diabetes mellitus with unspecified complications: Secondary | ICD-10-CM | POA: Diagnosis not present

## 2022-01-19 DIAGNOSIS — E559 Vitamin D deficiency, unspecified: Secondary | ICD-10-CM | POA: Diagnosis not present

## 2022-01-19 DIAGNOSIS — E782 Mixed hyperlipidemia: Secondary | ICD-10-CM | POA: Diagnosis not present

## 2022-01-23 ENCOUNTER — Ambulatory Visit
Admission: RE | Admit: 2022-01-23 | Discharge: 2022-01-23 | Disposition: A | Discharge status: Home / Self Care | Payer: Medicare HMO | Source: Ambulatory Visit | Attending: Internal Medicine | Admitting: Internal Medicine

## 2022-01-23 DIAGNOSIS — J90 Pleural effusion, not elsewhere classified: Secondary | ICD-10-CM | POA: Diagnosis not present

## 2022-01-23 DIAGNOSIS — I7121 Aneurysm of the ascending aorta, without rupture: Secondary | ICD-10-CM | POA: Diagnosis not present

## 2022-01-23 DIAGNOSIS — R911 Solitary pulmonary nodule: Secondary | ICD-10-CM | POA: Diagnosis not present

## 2022-01-23 DIAGNOSIS — J9811 Atelectasis: Secondary | ICD-10-CM | POA: Diagnosis not present

## 2022-01-23 DIAGNOSIS — R918 Other nonspecific abnormal finding of lung field: Secondary | ICD-10-CM | POA: Diagnosis not present

## 2022-01-25 DIAGNOSIS — E782 Mixed hyperlipidemia: Secondary | ICD-10-CM | POA: Diagnosis not present

## 2022-01-25 DIAGNOSIS — N1831 Chronic kidney disease, stage 3a: Secondary | ICD-10-CM | POA: Diagnosis not present

## 2022-01-25 DIAGNOSIS — R809 Proteinuria, unspecified: Secondary | ICD-10-CM | POA: Diagnosis not present

## 2022-01-25 DIAGNOSIS — R972 Elevated prostate specific antigen [PSA]: Secondary | ICD-10-CM | POA: Diagnosis not present

## 2022-01-25 DIAGNOSIS — Z0000 Encounter for general adult medical examination without abnormal findings: Secondary | ICD-10-CM | POA: Diagnosis not present

## 2022-01-25 DIAGNOSIS — E669 Obesity, unspecified: Secondary | ICD-10-CM | POA: Diagnosis not present

## 2022-01-25 DIAGNOSIS — E559 Vitamin D deficiency, unspecified: Secondary | ICD-10-CM | POA: Diagnosis not present

## 2022-01-25 DIAGNOSIS — E118 Type 2 diabetes mellitus with unspecified complications: Secondary | ICD-10-CM | POA: Diagnosis not present

## 2022-01-25 DIAGNOSIS — I10 Essential (primary) hypertension: Secondary | ICD-10-CM | POA: Diagnosis not present

## 2022-02-16 DIAGNOSIS — I10 Essential (primary) hypertension: Secondary | ICD-10-CM | POA: Diagnosis not present

## 2022-02-16 DIAGNOSIS — N1831 Chronic kidney disease, stage 3a: Secondary | ICD-10-CM | POA: Diagnosis not present

## 2022-02-16 DIAGNOSIS — R600 Localized edema: Secondary | ICD-10-CM | POA: Diagnosis not present

## 2022-02-16 DIAGNOSIS — E669 Obesity, unspecified: Secondary | ICD-10-CM | POA: Diagnosis not present

## 2022-05-03 DIAGNOSIS — E118 Type 2 diabetes mellitus with unspecified complications: Secondary | ICD-10-CM | POA: Diagnosis not present

## 2022-05-03 DIAGNOSIS — E782 Mixed hyperlipidemia: Secondary | ICD-10-CM | POA: Diagnosis not present

## 2022-05-03 DIAGNOSIS — E559 Vitamin D deficiency, unspecified: Secondary | ICD-10-CM | POA: Diagnosis not present

## 2022-05-10 DIAGNOSIS — E118 Type 2 diabetes mellitus with unspecified complications: Secondary | ICD-10-CM | POA: Diagnosis not present

## 2022-05-10 DIAGNOSIS — N1831 Chronic kidney disease, stage 3a: Secondary | ICD-10-CM | POA: Diagnosis not present

## 2022-05-10 DIAGNOSIS — E1122 Type 2 diabetes mellitus with diabetic chronic kidney disease: Secondary | ICD-10-CM | POA: Diagnosis not present

## 2022-05-10 DIAGNOSIS — I10 Essential (primary) hypertension: Secondary | ICD-10-CM | POA: Diagnosis not present

## 2022-05-10 DIAGNOSIS — E782 Mixed hyperlipidemia: Secondary | ICD-10-CM | POA: Diagnosis not present

## 2022-05-10 DIAGNOSIS — I129 Hypertensive chronic kidney disease with stage 1 through stage 4 chronic kidney disease, or unspecified chronic kidney disease: Secondary | ICD-10-CM | POA: Diagnosis not present

## 2022-05-10 DIAGNOSIS — R809 Proteinuria, unspecified: Secondary | ICD-10-CM | POA: Diagnosis not present

## 2022-05-10 DIAGNOSIS — C7A090 Malignant carcinoid tumor of the bronchus and lung: Secondary | ICD-10-CM | POA: Diagnosis not present

## 2022-05-10 DIAGNOSIS — J449 Chronic obstructive pulmonary disease, unspecified: Secondary | ICD-10-CM | POA: Diagnosis not present

## 2022-06-04 ENCOUNTER — Other Ambulatory Visit: Payer: Self-pay | Admitting: Internal Medicine

## 2022-06-05 ENCOUNTER — Ambulatory Visit (HOSPITAL_COMMUNITY)
Admission: RE | Admit: 2022-06-05 | Discharge: 2022-06-05 | Disposition: A | Discharge status: Home / Self Care | Payer: Medicare HMO | Source: Ambulatory Visit | Attending: Internal Medicine | Admitting: Internal Medicine

## 2022-06-05 ENCOUNTER — Inpatient Hospital Stay: Payer: Medicare HMO | Attending: Internal Medicine

## 2022-06-05 ENCOUNTER — Encounter (HOSPITAL_COMMUNITY): Payer: Self-pay

## 2022-06-05 DIAGNOSIS — C7A090 Malignant carcinoid tumor of the bronchus and lung: Secondary | ICD-10-CM

## 2022-06-05 DIAGNOSIS — Z79899 Other long term (current) drug therapy: Secondary | ICD-10-CM | POA: Diagnosis not present

## 2022-06-05 DIAGNOSIS — I10 Essential (primary) hypertension: Secondary | ICD-10-CM | POA: Diagnosis not present

## 2022-06-05 DIAGNOSIS — Z902 Acquired absence of lung [part of]: Secondary | ICD-10-CM | POA: Diagnosis not present

## 2022-06-05 DIAGNOSIS — Z85118 Personal history of other malignant neoplasm of bronchus and lung: Secondary | ICD-10-CM | POA: Diagnosis not present

## 2022-06-05 DIAGNOSIS — Z8673 Personal history of transient ischemic attack (TIA), and cerebral infarction without residual deficits: Secondary | ICD-10-CM | POA: Diagnosis not present

## 2022-06-05 DIAGNOSIS — R918 Other nonspecific abnormal finding of lung field: Secondary | ICD-10-CM | POA: Diagnosis not present

## 2022-06-05 LAB — CBC WITH DIFFERENTIAL (CANCER CENTER ONLY)
Abs Immature Granulocytes: 0.01 10*3/uL (ref 0.00–0.07)
Basophils Absolute: 0 10*3/uL (ref 0.0–0.1)
Basophils Relative: 0 %
Eosinophils Absolute: 0.1 10*3/uL (ref 0.0–0.5)
Eosinophils Relative: 1 %
HCT: 50.4 % (ref 39.0–52.0)
Hemoglobin: 16.9 g/dL (ref 13.0–17.0)
Immature Granulocytes: 0 %
Lymphocytes Relative: 18 %
Lymphs Abs: 1.1 10*3/uL (ref 0.7–4.0)
MCH: 31.9 pg (ref 26.0–34.0)
MCHC: 33.5 g/dL (ref 30.0–36.0)
MCV: 95.3 fL (ref 80.0–100.0)
Monocytes Absolute: 0.5 10*3/uL (ref 0.1–1.0)
Monocytes Relative: 9 %
Neutro Abs: 4.3 10*3/uL (ref 1.7–7.7)
Neutrophils Relative %: 72 %
Platelet Count: 191 10*3/uL (ref 150–400)
RBC: 5.29 MIL/uL (ref 4.22–5.81)
RDW: 13 % (ref 11.5–15.5)
WBC Count: 6 10*3/uL (ref 4.0–10.5)
nRBC: 0 % (ref 0.0–0.2)

## 2022-06-05 LAB — CMP (CANCER CENTER ONLY)
ALT: 21 U/L (ref 0–44)
AST: 21 U/L (ref 15–41)
Albumin: 4.2 g/dL (ref 3.5–5.0)
Alkaline Phosphatase: 103 U/L (ref 38–126)
Anion gap: 5 (ref 5–15)
BUN: 19 mg/dL (ref 8–23)
CO2: 30 mmol/L (ref 22–32)
Calcium: 9.2 mg/dL (ref 8.9–10.3)
Chloride: 106 mmol/L (ref 98–111)
Creatinine: 1.5 mg/dL — ABNORMAL HIGH (ref 0.61–1.24)
GFR, Estimated: 50 mL/min — ABNORMAL LOW (ref >60)
Glucose, Bld: 142 mg/dL — ABNORMAL HIGH (ref 70–99)
Potassium: 4.8 mmol/L (ref 3.5–5.1)
Sodium: 141 mmol/L (ref 135–145)
Total Bilirubin: 1.2 mg/dL (ref 0.3–1.2)
Total Protein: 7.5 g/dL (ref 6.5–8.1)

## 2022-06-07 ENCOUNTER — Inpatient Hospital Stay: Payer: Medicare HMO

## 2022-06-07 ENCOUNTER — Inpatient Hospital Stay (HOSPITAL_BASED_OUTPATIENT_CLINIC_OR_DEPARTMENT_OTHER): Payer: Medicare HMO | Admitting: Internal Medicine

## 2022-06-07 VITALS — BP 180/110 | HR 78 | Temp 97.6°F | Resp 17 | Wt 240.4 lb

## 2022-06-07 DIAGNOSIS — I10 Essential (primary) hypertension: Secondary | ICD-10-CM

## 2022-06-07 DIAGNOSIS — Z8673 Personal history of transient ischemic attack (TIA), and cerebral infarction without residual deficits: Secondary | ICD-10-CM | POA: Diagnosis not present

## 2022-06-07 DIAGNOSIS — C7A090 Malignant carcinoid tumor of the bronchus and lung: Secondary | ICD-10-CM

## 2022-06-07 DIAGNOSIS — Z79899 Other long term (current) drug therapy: Secondary | ICD-10-CM | POA: Diagnosis not present

## 2022-06-07 DIAGNOSIS — Z902 Acquired absence of lung [part of]: Secondary | ICD-10-CM | POA: Diagnosis not present

## 2022-06-07 DIAGNOSIS — Z85118 Personal history of other malignant neoplasm of bronchus and lung: Secondary | ICD-10-CM | POA: Diagnosis not present

## 2022-06-07 MED ORDER — CLONIDINE HCL 0.1 MG PO TABS: 0.2000 mg | ORAL_TABLET | Freq: Once | ORAL | Status: DC

## 2022-06-07 MED ORDER — CLONIDINE HCL 0.1 MG PO TABS
0.2000 mg | ORAL_TABLET | Freq: Once | ORAL | Status: AC
  Administered 2022-06-07: 0.2 mg via ORAL
  Filled 2022-06-07: qty 2

## 2022-06-07 NOTE — Progress Notes (Signed)
Patient's BP taken manually x 2, see VS flowsheet, clonodine 0.2 mg given per Dr. Julien Nordmann.  Instructed patient & his family member to F/U with his PCP who manages his hypertension.  Informed patient this is especially important with his hx of aneurysm.  Patient & family member verbalize understanding.

## 2022-06-07 NOTE — Progress Notes (Signed)
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  PRIOR THERAPY: Status post left upper lobectomy with lymph node sampling under the care of Dr. Kipp Brood on October 23, 2021.   CURRENT THERAPY: Observation  INTERVAL HISTORY: Chris Lucas 72 y.o. male returns to the clinic today for follow-up visit accompanied by his wife.  The patient is feeling fine today with no concerning complaints except for the uncontrolled hypertension.  He is on several blood pressure medication by his primary care physician and cardiologist.  He continues to have intermittent left-sided chest pain with shortness of breath with exertion with no cough or hemoptysis.  The patient has no significant weight loss or night sweats.  He has no nausea, vomiting, diarrhea or constipation.  He is here today for evaluation with repeat CT scan of the chest for restaging of his disease.  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  MEDICATIONS:  Current Outpatient Medications  Medication Sig Dispense Refill   albuterol (VENTOLIN HFA) 108 (90 Base) MCG/ACT inhaler Inhale 2 puffs into the lungs every 6 (six) hours as needed for wheezing or shortness of breath. 8 g 6   apixaban (ELIQUIS) 5 MG TABS tablet Take 5 mg by mouth 2 (two) times daily.     atorvastatin (LIPITOR) 40 MG tablet Take 40 mg by mouth daily.     bisacodyl  (DULCOLAX) 5 MG EC tablet Take 5 mg by mouth daily as needed for moderate constipation.     carvedilol (COREG) 6.25 MG tablet TAKE 1 TABLET BY MOUTH TWICE A DAY 180 tablet 2   dapagliflozin propanediol (FARXIGA) 10 MG TABS tablet Take 1 tablet (10 mg total) by mouth daily. 30 tablet 1   furosemide (LASIX) 40 MG tablet Take 1 tablet (40 mg total) by mouth daily. 30 tablet 1   irbesartan (AVAPRO) 150 MG tablet Take 0.5 tablets (75 mg total) by mouth daily. 90 tablet 3   loratadine (CLARITIN) 10 MG tablet Take 10 mg by mouth daily as needed for allergies.     ondansetron (ZOFRAN-ODT) 4 MG disintegrating tablet Take 1 tablet (4 mg total) by mouth every 8 (eight) hours as needed for nausea or vomiting. 20 tablet 0   OXYGEN Inhale 2 L into the lungs.     Vitamin D, Ergocalciferol, (DRISDOL) 1.25 MG (50000 UNIT) CAPS capsule Take 50,000 Units by mouth every Sunday.     No current facility-administered medications for this visit.    SURGICAL HISTORY:  Past Surgical History:  Procedure Laterality Date   BRONCHIAL BIOPSY  07/24/2021   Procedure: BRONCHIAL BIOPSIES;  Surgeon: Collene Gobble, MD;  Location: [facility] ENDOSCOPY;  Service: Pulmonary;;   BRONCHIAL BRUSHINGS  07/24/2021   Procedure: BRONCHIAL BRUSHINGS;  Surgeon: Collene Gobble, MD;  Location[facility] ENDOSCOPY;  Service: Pulmonary;;  BRONCHIAL NEEDLE ASPIRATION BIOPSY  07/24/2021   Procedure: BRONCHIAL NEEDLE ASPIRATION BIOPSIES;  Surgeon: Collene Gobble, MD;  Location: [facility] ENDOSCOPY;  Service: Pulmonary;;   CARDIAC CATHETERIZATION     "Years ago"  (apporx 18-20 yrs)   CARDIOVERSION N/A 09/01/2021   Procedure: CARDIOVERSION;  Surgeon: [name], MD;  Location: [facility];  Service: Cardiovascular;  Laterality: N/A;   colonoscopy     INTERCOSTAL NERVE BLOCK  10/23/2021   Procedure: INTERCOSTAL NERVE BLOCK;  Surgeon: Lajuana Matte, MD;  Location: [facility];  Service: Thoracic;;   NODE DISSECTION  10/23/2021   Procedure: NODE DISSECTION;   Surgeon: Lajuana Matte, MD;  Location: [facility];  Service: Thoracic;;   VIDEO BRONCHOSCOPY WITH RADIAL ENDOBRONCHIAL ULTRASOUND  07/24/2021   Procedure: VIDEO BRONCHOSCOPY WITH RADIAL ENDOBRONCHIAL ULTRASOUND;  Surgeon: Collene Gobble, MD;  Location: [facility] ENDOSCOPY;  Service: Pulmonary;;    REVIEW OF SYSTEMS:  A comprehensive review of systems was negative except for: Respiratory: positive for dyspnea on exertion and pleurisy/chest pain   PHYSICAL EXAMINATION: General appearance: alert, cooperative, fatigued, and no distress Head: Normocephalic, without obvious abnormality, atraumatic Neck: no adenopathy, no JVD, supple, symmetrical, trachea midline, and thyroid not enlarged, symmetric, no tenderness/mass/nodules Lymph nodes: Cervical, supraclavicular, and axillary nodes normal. Resp: clear to auscultation bilaterally Back: symmetric, no curvature. ROM normal. No CVA tenderness. Cardio: regular rate and rhythm, S1, S2 normal, no murmur, click, rub or gallop GI: soft, non-tender; bowel sounds normal; no masses,  no organomegaly Extremities: extremities normal, atraumatic, no cyanosis or edema  ECOG PERFORMANCE STATUS: 1 - Symptomatic but completely ambulatory  Blood pressure (!) 180/110, pulse 78, temperature 97.6 F (36.4 C), temperature source Oral, resp. rate 17, weight 240 lb 6.4 oz (109 kg), SpO2 100 %.  LABORATORY DATA: Lab Results  Component Value Date   WBC 6.0 06/05/2022   HGB 16.9 06/05/2022   HCT 50.4 06/05/2022   MCV 95.3 06/05/2022   PLT 191 06/05/2022      Chemistry      Component Value Date/Time   NA 141 06/05/2022 1012   NA 143 12/04/2021 1523   K 4.8 06/05/2022 1012   CL 106 06/05/2022 1012   CO2 30 06/05/2022 1012   BUN 19 06/05/2022 1012   BUN 28 (H) 12/04/2021 1523   CREATININE 1.50 (H) 06/05/2022 1012      Component Value Date/Time   CALCIUM 9.2 06/05/2022 1012   ALKPHOS 103 06/05/2022 1012   AST 21 06/05/2022 1012   ALT 21 06/05/2022 1012    BILITOT 1.2 06/05/2022 1012       RADIOGRAPHIC STUDIES: CT Chest Wo Contrast  Result Date: 06/06/2022 CLINICAL DATA:  Neuroendocrine tumor of the lung. * Tracking Code: BO * EXAM: CT CHEST WITHOUT CONTRAST TECHNIQUE: Multidetector CT imaging of the chest was performed following the standard protocol without IV contrast. RADIATION DOSE REDUCTION: This exam was performed according to the departmental dose-optimization program which includes automated exposure control, adjustment of the mA and/or kV according to patient size and/or use of iterative reconstruction technique. COMPARISON:  01/23/2022. FINDINGS: Cardiovascular: Enlarged pulmonic trunk and heart. Atherosclerotic calcification of the aortic valve. 4.5 cm ascending aortic aneurysm (6/52). No pericardial effusion. Mediastinum/Nodes: No pathologically enlarged mediastinal or axillary lymph nodes. Hilar regions are difficult to definitively evaluate without IV contrast. Esophagus is grossly unremarkable. Lungs/Pleura: Left upper lobectomy. Post radiation scarring and volume loss in the perihilar left lower lobe, adjacent to an elevated left hemidiaphragm. Trace left fibrothorax. Mild pleuroparenchymal  scarring in the base of the right hemithorax. No new pulmonary nodules. Airway is otherwise unremarkable. Upper Abdomen: Probable cyst in the liver. Visualized portions of the liver, gallbladder adrenal glands are otherwise unremarkable. Subcentimeter low-attenuation lesion in the right kidney. No specific follow-up necessary. Visualized portions of the kidneys, spleen and pancreas are grossly unremarkable. Proximal gastric wall thickening, similar. No upper abdominal adenopathy. Musculoskeletal: Degenerative changes in the spine. No worrisome lytic or sclerotic lesions. IMPRESSION: 1. Postsurgical and post treatment changes in the left hemithorax without evidence of recurrent or metastatic disease. 2. Trace left fibrothorax. 3. 4.5 cm ascending aortic  aneurysm, stable. Ascending thoracic aortic aneurysm. Recommend semi-annual imaging followup by CTA or MRA and referral to cardiothoracic surgery if not already obtained. This recommendation follows 2010 ACCF/AHA/AATS/ACR/ASA/SCA/SCAI/SIR/STS/SVM Guidelines for the Diagnosis and Management of Patients With Thoracic Aortic Disease. Circulation. 2010; [id number]. Aortic aneurysm NOS (ICD10-I71.9). 4. Chronic gastric wall thickening. 5. Enlarged pulmonic trunk, indicative of pulmonary arterial hypertension. Electronically Signed   By: Lorin Picket M.D.   On: 06/06/2022 11:54    ASSESSMENT AND PLAN: This is a very pleasant 72 years old African-American male with a stage Ib (T2a, N0, M0) typical carcinoid presented with left upper lobe lung mass status post left upper lobectomy with lymph node sampling under the care of Dr. Kipp Brood on October 23, 2021. The patient is currently on observation and he is feeling fine except for the intermittent left-sided chest pain and shortness of breath with exertion. He had repeat CT scan of the chest without contrast performed recently.  I personally and independently reviewed the scan and discussed the result with the patient and his wife. His scan showed no concerning findings for disease recurrence or metastasis. I recommended for him to continue on observation with repeat CT scan of the chest in 1 year. For the hypertension, I advised the patient to take his blood pressure medication as prescribed and to monitor it closely at home and discuss with his primary care physician and cardiologist adjustment of his medication.  Will give him a dose of clonidine 0.2 mg p.o. x 1 in the clinic today. The patient was advised to call immediately if he has any other concerning symptoms in the interval. The patient voices understanding of current disease status and treatment options and is in agreement with the current care plan.  All questions were answered. The patient knows  to call the clinic with any problems, questions or concerns. We can certainly see the patient much sooner if necessary. The total time spent in the appointment was 30 minutes.  Disclaimer: This note was dictated with voice recognition software. Similar sounding words can inadvertently be transcribed and may not be corrected upon review.

## 2022-06-08 DIAGNOSIS — I10 Essential (primary) hypertension: Secondary | ICD-10-CM | POA: Diagnosis not present

## 2022-06-08 DIAGNOSIS — I4892 Unspecified atrial flutter: Secondary | ICD-10-CM | POA: Diagnosis not present

## 2022-06-08 DIAGNOSIS — Z7901 Long term (current) use of anticoagulants: Secondary | ICD-10-CM | POA: Diagnosis not present

## 2022-06-25 DIAGNOSIS — I712 Thoracic aortic aneurysm, without rupture, unspecified: Secondary | ICD-10-CM | POA: Diagnosis not present

## 2022-06-25 DIAGNOSIS — I10 Essential (primary) hypertension: Secondary | ICD-10-CM | POA: Diagnosis not present

## 2022-06-25 DIAGNOSIS — I7121 Aneurysm of the ascending aorta, without rupture: Secondary | ICD-10-CM | POA: Diagnosis not present

## 2022-06-25 DIAGNOSIS — E669 Obesity, unspecified: Secondary | ICD-10-CM | POA: Diagnosis not present

## 2022-06-25 DIAGNOSIS — I4892 Unspecified atrial flutter: Secondary | ICD-10-CM | POA: Diagnosis not present

## 2022-06-25 DIAGNOSIS — Z79899 Other long term (current) drug therapy: Secondary | ICD-10-CM | POA: Diagnosis not present

## 2022-06-25 DIAGNOSIS — Z7901 Long term (current) use of anticoagulants: Secondary | ICD-10-CM | POA: Diagnosis not present

## 2022-06-25 DIAGNOSIS — C7A090 Malignant carcinoid tumor of the bronchus and lung: Secondary | ICD-10-CM | POA: Diagnosis not present

## 2022-06-25 DIAGNOSIS — R600 Localized edema: Secondary | ICD-10-CM | POA: Diagnosis not present

## 2022-06-27 ENCOUNTER — Ambulatory Visit: Payer: Medicare HMO | Attending: Internal Medicine | Admitting: Internal Medicine

## 2022-06-27 VITALS — BP 180/74 | HR 74 | Ht 73.0 in | Wt 244.2 lb

## 2022-06-27 DIAGNOSIS — I10 Essential (primary) hypertension: Secondary | ICD-10-CM

## 2022-06-27 MED ORDER — CARVEDILOL 25 MG PO TABS: 25.0000 mg | ORAL_TABLET | Freq: Two times a day (BID) | ORAL | 3 refills | Status: DC

## 2022-06-27 NOTE — Patient Instructions (Signed)
Medication Instructions:  Your physician has recommended you make the following change in your medication:  INCREASE: Carvedilol 25mg  twice daily *If you need a refill on your cardiac medications before your next appointment, please call your pharmacy*   Lab Work: NONE If you have labs (blood work) drawn today and your tests are completely normal, you will receive your results only by: [place] (if you have MyChart) OR A paper copy in the mail If you have any lab test that is abnormal or we need to change your treatment, we will call you to review the results.   Testing/Procedures: NONE   Follow-Up: At [facility], you and your health needs are our priority.  As part of our continuing mission to provide you with exceptional heart care, we have created designated Provider Care Teams.  These Care Teams include your primary Cardiologist (physician) and Advanced Practice Providers (APPs -  Physician Assistants and Nurse Practitioners) who all work together to provide you with the care you need, when you need it.  We recommend signing up for the patient portal called "MyChart".  Sign up information is provided on this After Visit Summary.  MyChart is used to connect with patients for Virtual Visits (Telemedicine).  Patients are able to view lab/test results, encounter notes, upcoming appointments, etc.  Non-urgent messages can be sent to your provider as well.   To learn more about what you can do with MyChart, go to [email or web address].    Your next appointment:   6 month(s)  Provider:   Janina Mayo, MD     Other Instructions Please Schedule patient to see Pharm-D in 1 month

## 2022-06-27 NOTE — Progress Notes (Signed)
Cardiology Office Note:    Date:  06/27/2022   ID:  Chris Lucas, DOB 15-Mar-1951, MRN [id number]  PCP:  [name], MD   [facility] Cardiologist:  Janina Mayo, MD     Referring MD: [name], MD   No chief complaint on file. Establish care  History of Present Illness:    Chris Lucas is a 72 y.o. 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 He had transient LOC for 30 seconds. He felt nauseas at this time after eating. EKG was notable for typical atrial flutter with variable block. LOC cause was unknown; unlikely cardiac.  For his atrial flutter he was started on eliquis.  At this time he had an incidental finding of a lung mass c/f malignancy. He had biopsy + for carcinoid tumor. He was also noted to have 4.8 cm ascending thoracic aortic aneurysm.  Today, he presents for follow up. He's had no recurrence of his symptoms of LOC. No chest pressure or SOB. He lifts weights.TSH is normal. He has apneic events per his wife while sleeping  Otherwise he notes he had a cath 20 years ago. He notes allergy to contrast dye.   He was seen by Dr. Kipp Brood for resection of his carcinoid tumor recently. Recommended PFTs prior to procedure.  Does not have an oncologist.   Interim Hx 11/23/2021:  He is s/p successful DCCV in May 2023. 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 Kipp Brood. He is on 2L O2. He has an appointment with medical oncology  He had LE and found to be atrial flutter again later in July.He was diuresed net negative 8.5L. He was continued on lasix 40 mg daily. Norvasc was held. Plan was to consider outpatient DCCV  He does not exert himself extensively.  He denies palpitations. No LH. He remains in atrial flutter  Interim 06/27/2022 He was  seen by his oncologist Dr. Julien Nordmann. His blood pressures were significantly high and he received a dose of clonidine. BP 180/110. His blood pressure is 180/104. Has do 14 steps in his house, can be winded at the top but that resolves.    Cardiology Studies 07/07/2021: TTE- EF normal.  LA vol index 42 cc/m2, no valve disease  Past Medical History:  Diagnosis Date   Atrial flutter (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   Pneumonia    Pre-diabetes    diet controlled   Stroke ([place]) 07/06/2021   Thoracic ascending aortic aneurysm [facility])     Past Surgical History:  Procedure Laterality Date   BRONCHIAL BIOPSY  07/24/2021   Procedure: BRONCHIAL BIOPSIES;  Surgeon: Collene Gobble, MD;  Location: [facility] ENDOSCOPY;  Service: Pulmonary;;   BRONCHIAL BRUSHINGS  07/24/2021   Procedure: BRONCHIAL BRUSHINGS;  Surgeon: Collene Gobble, MD;  Location: [facility] ENDOSCOPY;  Service: Pulmonary;;   BRONCHIAL NEEDLE ASPIRATION BIOPSY  07/24/2021   Procedure: BRONCHIAL NEEDLE ASPIRATION BIOPSIES;  Surgeon: Collene Gobble, MD;  Location: [place] ENDOSCOPY;  Service: Pulmonary;;   CARDIAC CATHETERIZATION     "Years ago"  (apporx 18-20 yrs)   CARDIOVERSION N/A 09/01/2021  Procedure: CARDIOVERSION;  Surgeon: [name], MD;  Location: [place];  Service: Cardiovascular;  Laterality: N/A;   colonoscopy     INTERCOSTAL NERVE BLOCK  10/23/2021   Procedure: INTERCOSTAL NERVE BLOCK;  Surgeon: Lajuana Matte, MD;  Location: [place];  Service: Thoracic;;   NODE DISSECTION  10/23/2021   Procedure: NODE DISSECTION;  Surgeon: Lajuana Matte, MD;  Location: [place];  Service: Thoracic;;   VIDEO BRONCHOSCOPY WITH RADIAL ENDOBRONCHIAL ULTRASOUND  07/24/2021   Procedure: VIDEO BRONCHOSCOPY WITH RADIAL ENDOBRONCHIAL ULTRASOUND;  Surgeon: Collene Gobble, MD;  Location: [facility] ENDOSCOPY;  Service:  Pulmonary;;    Current Medications: Current Outpatient Medications on File Prior to Visit  Medication Sig Dispense Refill   albuterol (VENTOLIN HFA) 108 (90 Base) MCG/ACT inhaler Inhale 2 puffs into the lungs every 6 (six) hours as needed for wheezing or shortness of breath. 8 g 6   apixaban (ELIQUIS) 5 MG TABS tablet Take 5 mg by mouth 2 (two) times daily.     atorvastatin (LIPITOR) 40 MG tablet Take 40 mg by mouth daily.     bisacodyl (DULCOLAX) 5 MG EC tablet Take 5 mg by mouth daily as needed for moderate constipation.     carvedilol (COREG) 6.25 MG tablet TAKE 1 TABLET BY MOUTH TWICE A DAY 180 tablet 2   dapagliflozin propanediol (FARXIGA) 10 MG TABS tablet Take 1 tablet (10 mg total) by mouth daily. 30 tablet 1   furosemide (LASIX) 40 MG tablet Take 1 tablet (40 mg total) by mouth daily. 30 tablet 1   irbesartan (AVAPRO) 150 MG tablet Take 0.5 tablets (75 mg total) by mouth daily. 90 tablet 3   loratadine (CLARITIN) 10 MG tablet Take 10 mg by mouth daily as needed for allergies.     ondansetron (ZOFRAN-ODT) 4 MG disintegrating tablet Take 1 tablet (4 mg total) by mouth every 8 (eight) hours as needed for nausea or vomiting. 20 tablet 0   OXYGEN Inhale 2 L into the lungs.     Vitamin D, Ergocalciferol, (DRISDOL) 1.25 MG (50000 UNIT) CAPS capsule Take 50,000 Units by mouth every Sunday.     No current facility-administered medications on file prior to visit.     Allergies:   Ivp dye [iodinated contrast media]   Social History   Socioeconomic History   Marital status: Married    Spouse name: Not on file   Number of children: Not on file   Years of education: Not on file   Highest education level: Not on file  Occupational History   Not on file  Tobacco Use   Smoking status: Never    Passive exposure: Never   Smokeless tobacco: Never  Vaping Use   Vaping Use: Never used  Substance and Sexual Activity   Alcohol use: Not Currently   Drug use: Not Currently   Sexual  activity: Not Currently  Other Topics Concern   Not on file  Social History Narrative   Not on file   Social Determinants of Health   Financial Resource Strain: Not on file  Food Insecurity: Not on file  Transportation Needs: Not on file  Physical Activity: Not on file  Stress: Not on file  Social Connections: Not on file     Family History: The patient's family history includes Aneurysm in his sister; Diabetes in his mother and sister; Pancreatic cancer in his sister; Stroke in his father.  ROS:   Please see the history of present illness.  All other systems reviewed and are negative.  EKGs/Labs/Other Studies Reviewed:    The following studies were reviewed today:   EKG:  EKG is  ordered today.  The ekg ordered today demonstrates   Atrial flutter with variable AV block 74 bpm  11/23/2021- atrial flutter 4:1 AV block  Recent Labs: 07/06/2021: Magnesium 2.4; TSH 1.119 06/05/2022: ALT 21; BUN 19; Creatinine 1.50; Hemoglobin 16.9; Platelet Count 191; Potassium 4.8; Sodium 141  Recent Lipid Panel    Component Value Date/Time   CHOL 157 07/07/2021 0424   TRIG 109 07/07/2021 0424   HDL 41 07/07/2021 0424   CHOLHDL 3.8 07/07/2021 0424   VLDL 22 07/07/2021 0424   LDLCALC 94 07/07/2021 0424     Risk Assessment/Calculations:           Physical Exam:    VS:   Vitals:   06/27/22 0930  BP: (!) 180/74  Pulse: 74  SpO2: 100%      Wt Readings from Last 3 Encounters:  06/07/22 240 lb 6.4 oz (109 kg)  12/29/21 240 lb (108.9 kg)  12/27/21 240 lb 3.2 oz (109 kg)     GEN:  Well nourished, well developed in no acute distress. Wearing supplemental O2 HEENT: Normal NECK: No JVD; No carotid bruits LYMPHATICS: No lymphadenopathy CARDIAC: RRR, no murmurs, rubs, gallops RESPIRATORY:  Clear to auscultation without rales, wheezing or rhonchi  ABDOMEN: Soft, non-tender, non-distended MUSCULOSKELETAL:  No edema; No deformity  SKIN: Warm and dry NEUROLOGIC:  Alert and  oriented x 3 PSYCHIATRIC:  Normal affect   ASSESSMENT:    Atrial Flutter: atypical flutter variable AV block. new onset March 2023.  Flutter looks more atypical unlikely amenable to CTI line. He is rate controlled. DCCV May 2023 successful. Has had recurrence post surgery. Recommended pulm for sleep study last visit. Discussed repeat DCCV before, he was hesitant. I said it was reasonable to continue rate control -pulse regular today - if symptomatic flutter will send referral to EP for AAD/Flutter ablation - continue eliquis 5 mg BID - continue coreg  HTN: poorly controlled. Notes does have difficulty taking meds as prescribed each day, he is on quite a bit. goal BP closer 130/80 mmHg with aneurysm. Was on norvasc 10 mg , held with LE edema. On irbesartan  150 mg daily. Started carvedilol 25 mg BID in May 2023; decreased to 6.25 mg BID in the hospital. He was started on clonidine 0.1 mg BID by his PCP. Will increase back to 25 mg BID.  Can add hydralazine if needed  LE edema: increase lasix 20 to 40 mg daily  HLD: atorvastatin 40 mg daily  Aortic ascending aneurysm : stable. 4.5 cm. can repeat in August, 2024  CKD 3: ~Crt 1.4. Stable  CardioOnc: with carcinoid tumor, will assess for symptoms of right sided valve dx with low threshold for repeat TTE. He is planned to see Dr. Curt Bears.  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 FU scans without recurrence  PLAN:    In order of problems listed above:   Increase coreg 25 mg BID Blood pressure clinic with pharmacy in 1 month Follow up in 6 months      Medication Adjustments/Labs and Tests Ordered: Current medicines are reviewed at length with the patient today.  Concerns regarding medicines are outlined above.  No orders of the defined types were placed in this encounter.  No orders of the defined types were placed in this encounter.   There are no  Patient Instructions on file for this visit.    Signed, Janina Mayo, MD  06/27/2022 8:49 AM    [facility]

## 2022-07-23 DIAGNOSIS — R600 Localized edema: Secondary | ICD-10-CM | POA: Diagnosis not present

## 2022-07-23 DIAGNOSIS — I712 Thoracic aortic aneurysm, without rupture, unspecified: Secondary | ICD-10-CM | POA: Diagnosis not present

## 2022-07-23 DIAGNOSIS — Z713 Dietary counseling and surveillance: Secondary | ICD-10-CM | POA: Diagnosis not present

## 2022-07-23 DIAGNOSIS — Z7182 Exercise counseling: Secondary | ICD-10-CM | POA: Diagnosis not present

## 2022-07-23 DIAGNOSIS — Z6833 Body mass index (BMI) 33.0-33.9, adult: Secondary | ICD-10-CM | POA: Diagnosis not present

## 2022-07-23 DIAGNOSIS — E669 Obesity, unspecified: Secondary | ICD-10-CM | POA: Diagnosis not present

## 2022-07-23 DIAGNOSIS — I719 Aortic aneurysm of unspecified site, without rupture: Secondary | ICD-10-CM | POA: Diagnosis not present

## 2022-07-23 DIAGNOSIS — I10 Essential (primary) hypertension: Secondary | ICD-10-CM | POA: Diagnosis not present

## 2022-07-23 DIAGNOSIS — I4892 Unspecified atrial flutter: Secondary | ICD-10-CM | POA: Diagnosis not present

## 2022-07-30 ENCOUNTER — Encounter: Payer: Self-pay | Admitting: Pharmacist Clinician (PhC)/ Clinical Pharmacy Specialist

## 2022-07-30 ENCOUNTER — Ambulatory Visit
Payer: Medicare HMO | Attending: Cardiovascular Disease | Admitting: Pharmacist Clinician (PhC)/ Clinical Pharmacy Specialist

## 2022-07-30 VITALS — BP 131/79 | HR 78 | Ht 73.0 in | Wt 243.0 lb

## 2022-07-30 DIAGNOSIS — I10 Essential (primary) hypertension: Secondary | ICD-10-CM | POA: Diagnosis not present

## 2022-07-30 NOTE — Progress Notes (Signed)
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 male patient of Dr Carolan Clines, in the office to discuss hypertension management.  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  He was started on Eliquis, which he has tolerated well.  He most recently saw Dr. Wyline Mood last month, at which time his blood pressure was elevated at 180/74 and she increased his carvedilol from 6.25 to 25 mg bid.    Today he is in the office for follow up, with his wife.  He notes that his blood pressure has been elevated for years, but was well controlled on lisinopril and amlodipine for some time.  He does complain about feeling tired after meals, but this has been a complaint for some time, so probably not related to recent increase in carvedilol dose.     Blood Pressure Goal:  130/80  Current Medications:  irbesartan 150 mg , carvedilol 25 mg bid, clonidine 0.1 mg bid  Previously tried: amlodipine - LEE  Family Hx:   mother (hypertension, DM, stroke - died at 92); father had heart disease (died at 58); sister (died colon cancer, 2-3 aneurysms, DM);  Social Hx:      Tobacco: no  Alcohol:  no  Caffeine: no coffee, occasional sweet tea; zero sugar Pepsi  Diet:   more eating at home, vegetables  more fresh and frozen, occasional canned; protein is mainly fish or chicken, occasional hamburger; smoothies with spinach, almond milk, whey, strawberries, bananas; oatmeal regularly; tends to graze throughout the day.    Exercise: walks some, but has been short of breath; did physical therapy  Home BP readings: home device is arm cuff > 5 years, no readings with him today.  States checks occasionally, but no good range of readings available   Adherence Assessment  Do you ever forget to take your medication? Yes No  Do you ever skip doses due to side effects? Yes No  Do you have trouble affording your medicines? Yes No  Are you ever unable to pick up your medication due to transportation difficulties? Yes No  Do you ever stop taking your medications because you don't believe they are helping? Yes No   Adherence strategy: 7 day pill minder    Accessory Clinical Findings    Lab Results  Component Value Date   CREATININE 1.50 (H) 06/05/2022   BUN 19 06/05/2022   NA 141 06/05/2022   K 4.8 06/05/2022   CL 106 06/05/2022   CO2 30 06/05/2022   Lab Results  Component Value Date   ALT 21 06/05/2022   AST 21 06/05/2022   ALKPHOS 103 06/05/2022   BILITOT 1.2 06/05/2022   Lab Results  Component Value Date  HGBA1C 7.6 (H) 10/19/2021    Home Medications    Current Outpatient Medications  Medication Sig Dispense Refill   albuterol (VENTOLIN HFA) 108 (90 Base) MCG/ACT inhaler Inhale 2 puffs into the lungs every 6 (six) hours as needed for wheezing or shortness of breath. 8 g 6   apixaban (ELIQUIS) 5 MG TABS tablet Take 5 mg by mouth 2 (two) times daily.     atorvastatin (LIPITOR) 40 MG tablet Take 40 mg by mouth daily.     carvedilol (COREG) 25 MG tablet Take 1 tablet (25 mg total) by mouth 2 (two) times daily. 180 tablet 3   cloNIDine (CATAPRES) 0.1 MG tablet Take 0.1 mg by mouth 2 (two) times daily.     dapagliflozin propanediol (FARXIGA) 10 MG TABS  tablet Take 1 tablet (10 mg total) by mouth daily. 30 tablet 1   furosemide (LASIX) 40 MG tablet Take 1 tablet (40 mg total) by mouth daily. 30 tablet 1   irbesartan (AVAPRO) 150 MG tablet Take 0.5 tablets (75 mg total) by mouth daily. (Patient taking differently: Take 150 mg by mouth daily.) 90 tablet 3   loratadine (CLARITIN) 10 MG tablet Take 10 mg by mouth daily as needed for allergies.     metFORMIN (GLUCOPHAGE-XR) 500 MG 24 hr tablet Take 500 mg by mouth daily.     OXYGEN Inhale 2 L into the lungs.     Vitamin D, Ergocalciferol, (DRISDOL) 1.25 MG (50000 UNIT) CAPS capsule Take 50,000 Units by mouth every Sunday.     ondansetron (ZOFRAN-ODT) 4 MG disintegrating tablet Take 1 tablet (4 mg total) by mouth every 8 (eight) hours as needed for nausea or vomiting. (Patient not taking: Reported on 07/30/2022) 20 tablet 0   No current facility-administered medications for this visit.         Assessment & Plan    Essential hypertension Assessment: BP is controlled in office BP 131/79 mmHg;   Tolerates irbesartan, carvedilol and clonidine well, without any side effects Denies SOB, palpitation, chest pain, headaches Some swelling in his ankles, improves with elevation Reiterated the importance of regular exercise and low salt diet   Plan:  Continue taking current medications.   Patient to keep record of BP readings with heart rate and report to [place] at the next visit - needs to bring this information along with his home meter Patient to follow up with Belenda Cruise in 1 month  Labs ordered today:  none   Phillips Hay PharmD CPP [facility]  [address] [place], [place] [address] [phone]

## 2022-07-30 NOTE — Assessment & Plan Note (Signed)
Assessment: BP is controlled in office BP 131/79 mmHg;   Tolerates irbesartan, carvedilol and clonidine well, without any side effects Denies SOB, palpitation, chest pain, headaches Some swelling in his ankles, improves with elevation Reiterated the importance of regular exercise and low salt diet   Plan:  Continue taking current medications.   Patient to keep record of BP readings with heart rate and report to [place] at the next visit - needs to bring this information along with his home meter Patient to follow up with Belenda Cruise in 1 month  Labs ordered today:  none

## 2022-07-30 NOTE — Patient Instructions (Signed)
Follow up appointment: Tuesday May 28 at 11:30  Take your BP meds as follows:  Continue with your current medications  Check your blood pressure at home daily (if able) and keep record of the readings.  Bring your home BP readings and monitor to your next appointment.    Hypertension "High blood pressure"  Hypertension is often called "The Silent Killer." It rarely causes symptoms until it is extremely  high or has done damage to other organs in the body. For this reason, you should have your  blood pressure checked regularly by your physician. We will check your blood pressure  every time you see a provider at one of our offices.   Your blood pressure reading consists of two numbers. Ideally, blood pressure should be  below 120/80. The first ("top") number is called the systolic pressure. It measures the  pressure in your arteries as your heart beats. The second ("bottom") number is called the diastolic pressure. It measures the pressure in your arteries as the heart relaxes between beats.  The benefits of getting your blood pressure under control are enormous. A 10-point  reduction in systolic blood pressure can reduce your risk of stroke by 27% and heart failure by 28%  Your blood pressure goal is <130/80  To check your pressure at home you will need to:  1. Sit up in a chair, with feet flat on the floor and back supported. Do not cross your ankles or legs. 2. Rest your left arm so that the cuff is about heart level. If the cuff goes on your upper arm,  then just relax the arm on the table, arm of the chair or your lap. If you have a wrist cuff, we  suggest relaxing your wrist against your chest (think of it as Pledging the Flag with the  wrong arm).  3. Place the cuff snugly around your arm, about 1 inch above the crook of your elbow. The  cords should be inside the groove of your elbow.  4. Sit quietly, with the cuff in place, for about 5 minutes. After that 5 minutes press  the power  button to start a reading. 5. Do not talk or move while the reading is taking place.  6. Record your readings on a sheet of paper. Although most cuffs have a memory, it is often  easier to see a pattern developing when the numbers are all in front of you.  7. You can repeat the reading after 1-3 minutes if it is recommended  Make sure your bladder is empty and you have not had caffeine or tobacco within the last 30 min  Always bring your blood pressure log with you to your appointments. If you have not brought your monitor in to be double checked for accuracy, please bring it to your next appointment.  You can find a list of quality blood pressure cuffs at [email or web address]

## 2022-08-16 ENCOUNTER — Ambulatory Visit (INDEPENDENT_AMBULATORY_CARE_PROVIDER_SITE_OTHER): Payer: Medicare HMO | Admitting: Neurology

## 2022-08-16 ENCOUNTER — Encounter: Payer: Self-pay | Admitting: Neurology

## 2022-08-16 VITALS — BP 146/78 | Ht 73.0 in | Wt 242.0 lb

## 2022-08-16 DIAGNOSIS — I7121 Aneurysm of the ascending aorta, without rupture: Secondary | ICD-10-CM

## 2022-08-16 DIAGNOSIS — I639 Cerebral infarction, unspecified: Secondary | ICD-10-CM

## 2022-08-16 NOTE — Progress Notes (Signed)
[facility]  PATIENT: Chris Lucas DOB: 1950/08/17  REQUESTING CLINICIAN: Benita Stabile, MD HISTORY FROM: Patient and spouse  REASON FOR VISIT: Right cerebellar stroke    HISTORICAL  CHIEF COMPLAINT:  Chief Complaint  Patient presents with   Follow-up    Rm 12, with wife Aral, stroke 23yr f/u, no new symptoms   INTERVAL HISTORY 08/16/2022: Patient presents today for follow up. He is accompanied by spouse. Last visit was a year ago for right cerebellar stroke. He reports that he has been doing very well since then. No deficit from his stroke.  He did have a lung biopsy done, was told that it was not cancerous.  He did follow-up with cardiothoracic surgeon, told that the aneurysm was 4 mm and he still needs follow-up.  He is doing well, he is exercising.  His blood pressure is still challenging.  He is on 4-hypertensive medications and his blood pressures reading are sometimes high.  Denies any new falls, denies any other complaints or concerns.   HISTORY OF PRESENT ILLNESS:  This is a 72 year old gentleman past medical history of hypertension, hyperlipidemia, diabetes mellitus who was admitted in the hospital on March 30 after syncopal episode.  Patient reports he was having lunch with his wife then suddenly fell flushed and hot, sweating profusely, then started feeling cold and was told by wife that his eyes rolled back and he slumped over.  During this work-up, he was found to have a new onset atrial flutter and 4 mm right cerebellar infarct.  Stroke etiology was likely cardioembolic from the cardiac flutter and patient was started on Eliquis.  Since discharge from the hospital he reported being compliant with the medication and no side effects.   During the hospitalization he was also found to have a lung mass.  He reported following with his pulmonologist, had a biopsy and was told that it has benign.  He reported that pulm will continue to follow him.  In terms  of his new diagnosis of aortic aneurysm, his pending follow-up with cardiothoracic surgery.  Currently he denies any deficit, denies any incoordination, stated that his gait is normal.  No falls.     Brief/Interim Summary: Patient was initially admitted with the diagnosis and further work-up of syncope but he was also found to have new onset of atrial flutter at the time of admission.  Subsequently MRI brain showed acute ischemic stroke. Neurology was consulted, they recommended continuing anticoagulation but no antiplatelet since the cause of his stroke was embolic secondary to new onset atrial flutter.  His rates were controlled.  He was also found to have new lung mass, 4.8 cm aortic aneurysm as well. Recommend semi-annual imagingfollowup by CTA or MRA and referral to cardiothoracic surgery.  Patient was seen by pulmonology for his lung mass.  They have scheduled him to see as outpatient with Dr. Solon Augusta on 07/10/2021 and they will plan outpatient bronchoscopy.  He was also seen by cardiology for new atrial flutter.  They recommended transitioning to Eliquis and no other medications and no other work-up.  His rates are controlled.  Patient is being prescribed Eliquis at the time of discharge.  He has been on Lovenox full dose during this hospitalization since yesterday.  I also discussed the case with Dr. Amada Jupiter of neurology who reviewed patient's imagings including MRA of the head and neck and recommended no other medications other than Eliquis.  He will follow-up with neurology in 4 weeks. Patient is non-smoker.  I met with the patient and his wife in the room this morning and answered several questions.  Adline Mango spoke to him in the afternoon and reviewed his imaging studies with him and answered several other questions.  He is agreeable with the plan.  He verbalized understanding as well.  He was seen by PT OT and since he is at his baseline, they did not recommend any home health PT OT.  He has been  cleared by all the consultant so he is going to be discharged in stable condition.   Discharge plan was discussed with patient and/or family member and they verbalized understanding and agreed with it.    OTHER MEDICAL CONDITIONS: Atrial flutter, hypertension, hyperlipidemia, diabetes    REVIEW OF SYSTEMS: Full 14 system review of systems performed and negative with exception of: as noted in the HPI   ALLERGIES: Allergies  Allergen Reactions   Ivp Dye [Iodinated Contrast Media] Shortness Of Breath and Rash    HOME MEDICATIONS: Outpatient Medications Prior to Visit  Medication Sig Dispense Refill   albuterol (VENTOLIN HFA) 108 (90 Base) MCG/ACT inhaler Inhale 2 puffs into the lungs every 6 (six) hours as needed for wheezing or shortness of breath. 8 g 6   apixaban (ELIQUIS) 5 MG TABS tablet Take 5 mg by mouth 2 (two) times daily.     atorvastatin (LIPITOR) 40 MG tablet Take 40 mg by mouth daily.     carvedilol (COREG) 25 MG tablet Take 1 tablet (25 mg total) by mouth 2 (two) times daily. 180 tablet 3   cloNIDine (CATAPRES) 0.1 MG tablet Take 0.1 mg by mouth 2 (two) times daily.     dapagliflozin propanediol (FARXIGA) 10 MG TABS tablet Take 1 tablet (10 mg total) by mouth daily. 30 tablet 1   furosemide (LASIX) 40 MG tablet Take 1 tablet (40 mg total) by mouth daily. 30 tablet 1   irbesartan (AVAPRO) 150 MG tablet Take 0.5 tablets (75 mg total) by mouth daily. (Patient taking differently: Take 150 mg by mouth daily.) 90 tablet 3   loratadine (CLARITIN) 10 MG tablet Take 10 mg by mouth daily as needed for allergies.     metFORMIN (GLUCOPHAGE-XR) 500 MG 24 hr tablet Take 500 mg by mouth daily.     Vitamin D, Ergocalciferol, (DRISDOL) 1.25 MG (50000 UNIT) CAPS capsule Take 50,000 Units by mouth every Sunday.     ondansetron (ZOFRAN-ODT) 4 MG disintegrating tablet Take 1 tablet (4 mg total) by mouth every 8 (eight) hours as needed for nausea or vomiting. (Patient not taking: Reported on  07/30/2022) 20 tablet 0   OXYGEN Inhale 2 L into the lungs. (Patient not taking: Reported on 08/16/2022)     No facility-administered medications prior to visit.    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)     PAST SURGICAL HISTORY: Past Surgical History:  Procedure Laterality Date   BRONCHIAL BIOPSY  07/24/2021   Procedure: BRONCHIAL BIOPSIES;  Surgeon: Leslye Peer, MD;  Location: [facility] ENDOSCOPY;  Service: Pulmonary;;   BRONCHIAL BRUSHINGS  07/24/2021   Procedure: BRONCHIAL BRUSHINGS;  Surgeon: Levy Pupa  S, MD;  Location: [facility] ENDOSCOPY;  Service: Pulmonary;;   BRONCHIAL NEEDLE ASPIRATION BIOPSY  07/24/2021   Procedure: BRONCHIAL NEEDLE ASPIRATION BIOPSIES;  Surgeon: Leslye Peer, MD;  Location: [facility] ENDOSCOPY;  Service: Pulmonary;;   CARDIAC CATHETERIZATION     "Years ago"  (apporx 18-20 yrs)   CARDIOVERSION N/A 09/01/2021   Procedure: CARDIOVERSION;  Surgeon: Chilton Si, MD;  Location: [facility] ENDOSCOPY;  Service: Cardiovascular;  Laterality: N/A;   colonoscopy     INTERCOSTAL NERVE BLOCK  10/23/2021   Procedure: INTERCOSTAL NERVE BLOCK;  Surgeon: Corliss Skains, MD;  Location: [facility] OR;  Service: Thoracic;;   NODE DISSECTION  10/23/2021   Procedure: NODE DISSECTION;  Surgeon: Corliss Skains, MD;  Location: [facility] OR;  Service: Thoracic;;   VIDEO BRONCHOSCOPY WITH RADIAL ENDOBRONCHIAL ULTRASOUND  07/24/2021   Procedure: VIDEO BRONCHOSCOPY WITH RADIAL ENDOBRONCHIAL ULTRASOUND;  Surgeon: Leslye Peer, MD;  Location: [facility] ENDOSCOPY;  Service: Pulmonary;;    FAMILY HISTORY: Family History  Problem Relation Age of Onset   Diabetes Mother    Stroke  Father    Diabetes Sister    Aneurysm Sister    Pancreatic cancer Sister     SOCIAL HISTORY: Social History   Socioeconomic History   Marital status: Married    Spouse name: Not on file   Number of children: Not on file   Years of education: Not on file   Highest education level: Not on file  Occupational History   Not on file  Tobacco Use   Smoking status: Never    Passive exposure: Never   Smokeless tobacco: Never  Vaping Use   Vaping Use: Never used  Substance and Sexual Activity   Alcohol use: Not Currently   Drug use: Not Currently   Sexual activity: Not Currently  Other Topics Concern   Not on file  Social History Narrative   Live at home with wife    Right handed   Caffeine-16-24oz daily   Social Determinants of Health   Financial Resource Strain: Not on file  Food Insecurity: Not on file  Transportation Needs: Not on file  Physical Activity: Not on file  Stress: Not on file  Social Connections: Not on file  Intimate Partner Violence: Not on file    PHYSICAL EXAM  GENERAL EXAM/CONSTITUTIONAL: Vitals:  Vitals:   08/16/22 0906 08/16/22 0919  BP: (!) 180/88 (!) 146/78  Weight: 242 lb (109.8 kg)   Height: 6\' 1"  (1.854 m)    Body mass index is 31.93 kg/m. Wt Readings from Last 3 Encounters:  08/16/22 242 lb (109.8 kg)  07/30/22 243 lb (110.2 kg)  06/27/22 244 lb 3.2 oz (110.8 kg)   Patient is in no distress; well developed, nourished and groomed; neck is supple  MUSCULOSKELETAL: Gait, strength, tone, movements noted in Neurologic exam below  NEUROLOGIC: MENTAL STATUS:      No data to display         awake, alert, oriented to person, place and time recent and remote memory intact normal attention and concentration language fluent, comprehension intact, naming intact fund of knowledge appropriate  CRANIAL NERVE: 2nd, 3rd, 4th, 6th - pupils equal and reactive to light, visual fields full to confrontation, extraocular muscles intact, no  nystagmus 5th - facial sensation symmetric 7th - facial strength symmetric 8th - hearing intact 9th - palate elevates symmetrically, uvula midline 11th - shoulder shrug symmetric 12th - tongue protrusion midline  MOTOR:  normal bulk and tone, full strength in the  BUE, BLE  SENSORY:  normal and symmetric to light touch  COORDINATION:  finger-nose-finger, fine finger movements normal  REFLEXES:  deep tendon reflexes present and symmetric  GAIT/STATION:  normal   DIAGNOSTIC DATA (LABS, IMAGING, TESTING) - I reviewed patient records, labs, notes, testing and imaging myself where available.  Lab Results  Component Value Date   WBC 6.0 06/05/2022   HGB 16.9 06/05/2022   HCT 50.4 06/05/2022   MCV 95.3 06/05/2022   PLT 191 06/05/2022      Component Value Date/Time   NA 141 06/05/2022 1012   NA 143 12/04/2021 1523   K 4.8 06/05/2022 1012   CL 106 06/05/2022 1012   CO2 30 06/05/2022 1012   GLUCOSE 142 (H) 06/05/2022 1012   BUN 19 06/05/2022 1012   BUN 28 (H) 12/04/2021 1523   CREATININE 1.50 (H) 06/05/2022 1012   CALCIUM 9.2 06/05/2022 1012   PROT 7.5 06/05/2022 1012   ALBUMIN 4.2 06/05/2022 1012   AST 21 06/05/2022 1012   ALT 21 06/05/2022 1012   ALKPHOS 103 06/05/2022 1012   BILITOT 1.2 06/05/2022 1012   GFRNONAA 50 (L) 06/05/2022 1012   GFRAA  03/26/2007 0116    >60        The eGFR has been calculated using the MDRD equation. This calculation has not been validated in all clinical   Lab Results  Component Value Date   CHOL 157 07/07/2021   HDL 41 07/07/2021   LDLCALC 94 07/07/2021   TRIG 109 07/07/2021   CHOLHDL 3.8 07/07/2021   Lab Results  Component Value Date   HGBA1C 7.6 (H) 10/19/2021   No results found for: "VITAMINB12" Lab Results  Component Value Date   TSH 1.119 07/06/2021    MRI Brain 07/06/21 1. 4 mm acute ischemic nonhemorrhagic infarct involving the inferior right cerebellum. 2. No other acute intracranial abnormality. No evidence  for metastatic disease. 3. Underlying moderate chronic microvascular ischemic disease.   MRA Head and Neck 07/06/21  1. Artifact limits evaluation of the distal M1 and proximal M2 middle cerebral arteries, bilaterally. 2. Artifact also limits evaluation of the P2 posterior cerebral arteries, bilaterally. 3. No intracranial large vessel occlusion or proximal high-grade arterial stenosis identified. 4. Mild atherosclerotic irregularity of the intracranial internal carotid arteries. 5. Anterior and posterior circulation dolichoectasia, suggesting longstanding hypertension.   MRA neck: 1. The common carotid and internal carotid arteries are patent within the neck without appreciable stenosis. 2. The vertebral arteries are codominant and patent within the neck. A portion of the distal cervical left vertebral artery is excluded from the field of view. Within this limitation, there is no appreciable cervical vertebral artery stenosis.     ASSESSMENT AND PLAN  72 y.o. year old male with vascular risk factors including hypertension, hyperlipidemia, diabetes mellitus who is presenting after a right cerebellar stroke.  He is doing well since the stroke, had full recovery. He is on Eliquis for atrial fibrillation.  He is also on Lipitor. Plan is for patient to continue current medication, continue to follow-up with his cardiologist and cardiothoracic surgeon.  He will return as needed basis    1. Cerebellar stroke (HCC)   2. Aneurysm of ascending aorta without rupture [facility])       Patient Instructions  Continue currents medications  BP goals less than 130 systolic  Return as needed   No orders of the defined types were placed in this encounter.   No orders of the defined types were placed  in this encounter.   Return if symptoms worsen or fail to improve.  I have spent a total of 35 minutes dedicated to this patient today, preparing to see patient, performing a medically appropriate  examination and evaluation, ordering tests and/or medications and procedures, and counseling and educating the patient/family/caregiver; independently interpreting result and communicating results to the family/patient/caregiver; and documenting clinical information in the electronic medical record.   Windell Norfolk, MD 08/16/2022, 10:15 AM  [facility] [address] [place], [place] [address] [phone]

## 2022-08-16 NOTE — Patient Instructions (Signed)
Continue currents medications  BP goals less than 130 systolic  Return as needed

## 2022-08-24 DIAGNOSIS — E118 Type 2 diabetes mellitus with unspecified complications: Secondary | ICD-10-CM | POA: Diagnosis not present

## 2022-08-24 DIAGNOSIS — E1122 Type 2 diabetes mellitus with diabetic chronic kidney disease: Secondary | ICD-10-CM | POA: Diagnosis not present

## 2022-08-24 DIAGNOSIS — E559 Vitamin D deficiency, unspecified: Secondary | ICD-10-CM | POA: Diagnosis not present

## 2022-08-24 DIAGNOSIS — E782 Mixed hyperlipidemia: Secondary | ICD-10-CM | POA: Diagnosis not present

## 2022-08-30 DIAGNOSIS — E1122 Type 2 diabetes mellitus with diabetic chronic kidney disease: Secondary | ICD-10-CM | POA: Diagnosis not present

## 2022-08-30 DIAGNOSIS — R600 Localized edema: Secondary | ICD-10-CM | POA: Diagnosis not present

## 2022-08-30 DIAGNOSIS — I712 Thoracic aortic aneurysm, without rupture, unspecified: Secondary | ICD-10-CM | POA: Diagnosis not present

## 2022-08-30 DIAGNOSIS — N1831 Chronic kidney disease, stage 3a: Secondary | ICD-10-CM | POA: Diagnosis not present

## 2022-08-30 DIAGNOSIS — I10 Essential (primary) hypertension: Secondary | ICD-10-CM | POA: Diagnosis not present

## 2022-08-30 DIAGNOSIS — I4892 Unspecified atrial flutter: Secondary | ICD-10-CM | POA: Diagnosis not present

## 2022-08-30 DIAGNOSIS — E669 Obesity, unspecified: Secondary | ICD-10-CM | POA: Diagnosis not present

## 2022-08-30 DIAGNOSIS — I129 Hypertensive chronic kidney disease with stage 1 through stage 4 chronic kidney disease, or unspecified chronic kidney disease: Secondary | ICD-10-CM | POA: Diagnosis not present

## 2022-08-30 DIAGNOSIS — C7A090 Malignant carcinoid tumor of the bronchus and lung: Secondary | ICD-10-CM | POA: Diagnosis not present

## 2022-08-31 ENCOUNTER — Encounter: Payer: Self-pay | Admitting: Internal Medicine

## 2022-09-04 ENCOUNTER — Encounter: Payer: Self-pay | Admitting: Pharmacist Clinician (PhC)/ Clinical Pharmacy Specialist

## 2022-09-04 ENCOUNTER — Ambulatory Visit
Payer: Medicare HMO | Attending: Cardiovascular Disease | Admitting: Pharmacist Clinician (PhC)/ Clinical Pharmacy Specialist

## 2022-09-04 VITALS — BP 142/90 | HR 66

## 2022-09-04 DIAGNOSIS — I10 Essential (primary) hypertension: Secondary | ICD-10-CM | POA: Diagnosis not present

## 2022-09-04 MED ORDER — CLONIDINE HCL 0.1 MG PO TABS: ORAL_TABLET | ORAL | 2 refills | Status: DC

## 2022-09-04 NOTE — Assessment & Plan Note (Signed)
Assessment: BP is uncontrolled in office BP 142/90 mmHg; above the goal (<130/80). Home cuff reads > 25 points higher systolic and > 15 points diastolic Tolerates current medications well without any side effects Denies SOB, palpitation, chest pain, headaches,or swelling Reiterated the importance of regular exercise and low salt diet   Plan:  Continue taking irbesartan, carvedilol and clonidine. Take an extra 0.1 mg of clonidine up to once daily for systolic pressure > 150. Patient to keep record of BP readings with heart rate and report to [place] at the next visit Patient to follow up with PharmD in 6 weeks  Labs ordered today:  none

## 2022-09-04 NOTE — Progress Notes (Signed)
Office Visit    Patient Name: Chris Lucas Date of Encounter: 09/04/2022  Primary Care Provider:  Benita Stabile, MD Primary Cardiologist:  Maisie Fus, MD  Chief Complaint    Hypertension  Significant Past Medical History   AFlutter New onset 06/2021, CHADS2-VASc = 5, on Eliquis  HLD LDL on atorvastatin 40   CVA 06/2021 - acute ischemic  CHF Chronic diastolic; on irbesartan, carvedilol, dapagliflozin, furosemide  CKD Stage 3; Scr 1.5, GFR 50   DM2 7/23 A1c 7.6 on metformin, dapagliflozin    Allergies  Allergen Reactions   Ivp Dye [Iodinated Contrast Media] Shortness Of Breath and Rash    History of Present Illness    Chris Lucas is a 72 y.o. male patient of Dr Carolan Clines, in the office to discuss hypertension management.  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  He was started on Eliquis, which he has tolerated well.  I saw him last month, at which time his BP was 131/79.   He was asked to continue with his medications and return in one month with his home meter and log of readings.    Today he is in the office for follow up, with his wife.  He brings his home meter with him, a [employer] machine that is close to 71 years old. The cuff is too small for his arm and reads 15+ points higher than the office device.    Blood Pressure Goal:  130/80  Current Medications:  irbesartan 150 mg , carvedilol 25 mg bid, clonidine 0.1 mg bid  Previously tried: amlodipine - LEE  Family Hx:   mother (hypertension, DM, stroke - died at 31); father had heart disease (died at 42); sister (died colon cancer, 2-3 aneurysms, DM);  Social Hx:      Tobacco: no  Alcohol:  no  Caffeine: no coffee, occasional sweet tea; zero sugar Pepsi  Diet:   more eating at home, vegetables more fresh and frozen, occasional canned; protein is mainly fish or chicken, occasional hamburger;  smoothies with spinach, almond milk, whey, strawberries, bananas; oatmeal regularly; tends to graze throughout the day.    Exercise: using stationary bike now, about 5-10 minutes at a time  Home BP readings: home device is arm cuff > 71 years old and reads high when compared to office device.  Will start using new [employer] device, cuff appropriate size.    Adherence Assessment  Do you ever forget to take your medication? [] Yes [x] No  Do you ever skip doses due to side effects? [] Yes [x] No  Do you have trouble affording your medicines? [] Yes [x] No  Are you ever unable to pick up your medication due to transportation difficulties? [] Yes [x] No  Do you ever stop taking your medications because you don't believe they are helping? [] Yes [x] No   Adherence strategy: 7 day pill minder    Accessory Clinical Findings    Lab Results  Component Value Date   CREATININE 1.50 (H) 06/05/2022   BUN 19 06/05/2022   NA 141 06/05/2022   K 4.8 06/05/2022   CL 106 06/05/2022   CO2 30 06/05/2022   Lab Results  Component Value Date   ALT 21 06/05/2022   AST 21 06/05/2022   ALKPHOS 103 06/05/2022   BILITOT 1.2 06/05/2022   Lab Results  Component Value Date   HGBA1C 7.6 (H) 10/19/2021  Home Medications    Current Outpatient Medications  Medication Sig Dispense Refill   albuterol (VENTOLIN HFA) 108 (90 Base) MCG/ACT inhaler Inhale 2 puffs into the lungs every 6 (six) hours as needed for wheezing or shortness of breath. 8 g 6   apixaban (ELIQUIS) 5 MG TABS tablet Take 5 mg by mouth 2 (two) times daily.     atorvastatin (LIPITOR) 40 MG tablet Take 40 mg by mouth daily.     carvedilol (COREG) 25 MG tablet Take 1 tablet (25 mg total) by mouth 2 (two) times daily. 180 tablet 3   cloNIDine (CATAPRES) 0.1 MG tablet Take 0.1 mg by mouth 2 (two) times daily.     dapagliflozin propanediol (FARXIGA) 10 MG TABS tablet Take 1 tablet (10 mg total) by mouth daily. 30 tablet 1   furosemide (LASIX) 40 MG  tablet Take 1 tablet (40 mg total) by mouth daily. 30 tablet 1   irbesartan (AVAPRO) 150 MG tablet Take 0.5 tablets (75 mg total) by mouth daily. (Patient taking differently: Take 150 mg by mouth daily.) 90 tablet 3   metFORMIN (GLUCOPHAGE-XR) 500 MG 24 hr tablet Take 500 mg by mouth daily.     Vitamin D, Ergocalciferol, (DRISDOL) 1.25 MG (50000 UNIT) CAPS capsule Take 50,000 Units by mouth every Sunday.     loratadine (CLARITIN) 10 MG tablet Take 10 mg by mouth daily as needed for allergies.     ondansetron (ZOFRAN-ODT) 4 MG disintegrating tablet Take 1 tablet (4 mg total) by mouth every 8 (eight) hours as needed for nausea or vomiting. (Patient not taking: Reported on 07/30/2022) 20 tablet 0   No current facility-administered medications for this visit.       Assessment & Plan    Essential hypertension Assessment: BP is uncontrolled in office BP 142/90 mmHg; above the goal (<130/80). Home cuff reads > 25 points higher systolic and > 15 points diastolic Tolerates current medications well without any side effects Denies SOB, palpitation, chest pain, headaches,or swelling Reiterated the importance of regular exercise and low salt diet   Plan:  Continue taking irbesartan, carvedilol and clonidine. Take an extra 0.1 mg of clonidine up to once daily for systolic pressure > 150. Patient to keep record of BP readings with heart rate and report to [place] at the next visit Patient to follow up with PharmD in 6 weeks  Labs ordered today:  none  Phillips Hay PharmD CPP [facility]  [address] [place], [place] [address] [phone]

## 2022-09-04 NOTE — Patient Instructions (Signed)
Follow up appointment: July 12 at 10:30 am  Take your BP meds as follows:  Continue with your current medications  If you systolic pressure is greater than 150, take an extra dose of clonidine 0.1 mg.  Don't take more than 3 tablets in a day  Check your blood pressure at home daily (if able) and keep record of the readings.  Hypertension "High blood pressure"  Hypertension is often called "The Silent Killer." It rarely causes symptoms until it is extremely  high or has done damage to other organs in the body. For this reason, you should have your  blood pressure checked regularly by your physician. We will check your blood pressure  every time you see a provider at one of our offices.   Your blood pressure reading consists of two numbers. Ideally, blood pressure should be  below 120/80. The first ("top") number is called the systolic pressure. It measures the  pressure in your arteries as your heart beats. The second ("bottom") number is called the diastolic pressure. It measures the pressure in your arteries as the heart relaxes between beats.  The benefits of getting your blood pressure under control are enormous. A 10-point  reduction in systolic blood pressure can reduce your risk of stroke by 27% and heart failure by 28%  Your blood pressure goal is <130/80  To check your pressure at home you will need to:  1. Sit up in a chair, with feet flat on the floor and back supported. Do not cross your ankles or legs. 2. Rest your left arm so that the cuff is about heart level. If the cuff goes on your upper arm,  then just relax the arm on the table, arm of the chair or your lap. If you have a wrist cuff, we  suggest relaxing your wrist against your chest (think of it as Pledging the Flag with the  wrong arm).  3. Place the cuff snugly around your arm, about 1 inch above the crook of your elbow. The  cords should be inside the groove of your elbow.  4. Sit quietly, with the cuff in  place, for about 5 minutes. After that 5 minutes press the power  button to start a reading. 5. Do not talk or move while the reading is taking place.  6. Record your readings on a sheet of paper. Although most cuffs have a memory, it is often  easier to see a pattern developing when the numbers are all in front of you.  7. You can repeat the reading after 1-3 minutes if it is recommended  Make sure your bladder is empty and you have not had caffeine or tobacco within the last 30 min  Always bring your blood pressure log with you to your appointments. If you have not brought your monitor in to be double checked for accuracy, please bring it to your next appointment.  You can find a list of quality blood pressure cuffs at [email or web address]

## 2022-10-19 ENCOUNTER — Ambulatory Visit
Payer: Medicare HMO | Attending: Cardiovascular Disease | Admitting: Pharmacist Clinician (PhC)/ Clinical Pharmacy Specialist

## 2022-10-19 ENCOUNTER — Encounter: Payer: Self-pay | Admitting: Pharmacist Clinician (PhC)/ Clinical Pharmacy Specialist

## 2022-10-19 VITALS — BP 137/87 | HR 66

## 2022-10-19 DIAGNOSIS — I10 Essential (primary) hypertension: Secondary | ICD-10-CM | POA: Diagnosis not present

## 2022-10-19 NOTE — Patient Instructions (Signed)
Follow up appointment: with Dr. Wyline Mood on Sept 16  Go to the lab in 2 weeks (around July 24-26) to get kidney function labs drawn  Take your BP meds as follows:  Increase irbesartan to 300 mg once daily (2 of the 150 mg tablets)  Continue with carvedilol and clonidine, both twice daily.   Continue with extra 0.1 mg clonidine  Check your blood pressure at home daily (if able) and keep record of the readings.  Hypertension "High blood pressure"  Hypertension is often called "The Silent Killer." It rarely causes symptoms until it is extremely  high or has done damage to other organs in the body. For this reason, you should have your  blood pressure checked regularly by your physician. We will check your blood pressure  every time you see a provider at one of our offices.   Your blood pressure reading consists of two numbers. Ideally, blood pressure should be  below 120/80. The first ("top") number is called the systolic pressure. It measures the  pressure in your arteries as your heart beats. The second ("bottom") number is called the diastolic pressure. It measures the pressure in your arteries as the heart relaxes between beats.  The benefits of getting your blood pressure under control are enormous. A 10-point  reduction in systolic blood pressure can reduce your risk of stroke by 27% and heart failure by 28%  Your blood pressure goal is < 130/80  To check your pressure at home you will need to:  1. Sit up in a chair, with feet flat on the floor and back supported. Do not cross your ankles or legs. 2. Rest your left arm so that the cuff is about heart level. If the cuff goes on your upper arm,  then just relax the arm on the table, arm of the chair or your lap. If you have a wrist cuff, we  suggest relaxing your wrist against your chest (think of it as Pledging the Flag with the  wrong arm).  3. Place the cuff snugly around your arm, about 1 inch above the crook of your elbow.  The  cords should be inside the groove of your elbow.  4. Sit quietly, with the cuff in place, for about 5 minutes. After that 5 minutes press the power  button to start a reading. 5. Do not talk or move while the reading is taking place.  6. Record your readings on a sheet of paper. Although most cuffs have a memory, it is often  easier to see a pattern developing when the numbers are all in front of you.  7. You can repeat the reading after 1-3 minutes if it is recommended  Make sure your bladder is empty and you have not had caffeine or tobacco within the last 30 min  Always bring your blood pressure log with you to your appointments. If you have not brought your monitor in to be double checked for accuracy, please bring it to your next appointment.  You can find a list of quality blood pressure cuffs at validatebp.org

## 2022-10-19 NOTE — Assessment & Plan Note (Addendum)
Assessment: BP is un/controlled in office BP 137/87 mmHg;  above the goal (<130/80). Tolerates current medications well without any side effects Took extra clonidine dose x 2 in past month for systolic BP > 150 Denies SOB, palpitation, chest pain, headaches,or swelling Reiterated the importance of regular exercise and low salt diet   Plan:  Increase irbesartan to 300 mg once daily Continue taking carvedilol and clonidine both twice daily Continue with extra dose of clonidine for systolic pressure > 150 Patient to keep record of BP readings with heart rate and report to [place] at the next visit Patient to follow up with Dr. Wyline Mood in September  Labs ordered today:  BMET in 10-14 days If kidney function does not tolerate the increase in irbesartan, will try increasing dose of clonidine

## 2022-10-19 NOTE — Progress Notes (Signed)
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 male patient of Dr Carolan Clines, in the office to discuss hypertension management.  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  He was started on Eliquis, which he has tolerated well.  I saw him last month, at which time his BP was 131/79.   He was asked to continue with his medications and return in one month with his home meter and log of readings.  At his follow up in May, we determined his home cuff to be inaccurate and higher than true readings.  No med changes were made, other than for him to take extra dose of clonidine when systolic > 150.    Today he returns for follow up.   Has new Om[employer] device, that is accurate.  Notes that he used clonidine 0.1 mg extra dose twice since his last visit.  Otherwise readings have been in the 130-140's.  Diastolic readings consistently in the 90's, occasionally will drop to the 80's  He was a little rushed today, and has not yet taken his medications.    Blood Pressure Goal:  130/80  Current Medications:  irbesartan 150 mg , carvedilol 25 mg bid, clonidine 0.1 mg bid  Previously tried: amlodipine - LEE  Family Hx:   mother (hypertension, DM, stroke - died at 7); father had heart disease  (died at 41); sister (died colon cancer, 2-3 aneurysms, DM);  Social Hx:      Tobacco: no  Alcohol:  no  Caffeine: no coffee, occasional sweet tea; zero sugar Pepsi  Diet:   more eating at home, vegetables more fresh and frozen, occasional canned; protein is mainly fish or chicken, occasional hamburger; smoothies with spinach, almond milk, whey, strawberries, bananas; oatmeal regularly; tends to graze throughout the day.    Exercise: using stationary bike now, about 5-10 minutes at a time  Home BP readings: has new Omron device, size appropriate and read accurately compared to office.  No readings with him today, but notes usually 130-140's/85-100.  Diastolic still always elevated.   Adherence Assessment  Do you ever forget to take your medication? [] Yes [x] No  Do you ever skip doses due to side effects? [] Yes [x] No  Do you have trouble affording your medicines? [] Yes [x] No  Are you ever unable to pick up your medication due to transportation difficulties? [] Yes [x] No  Do you ever stop taking your medications because you don't believe they are helping? [] Yes [x] No   Adherence strategy: 7 day pill minder    Accessory Clinical Findings    Lab Results  Component Value Date   CREATININE 1.50 (H) 06/05/2022   BUN 19 06/05/2022   NA 141 06/05/2022   K  4.8 06/05/2022   CL 106 06/05/2022   CO2 30 06/05/2022   Lab Results  Component Value Date   ALT 21 06/05/2022   AST 21 06/05/2022   ALKPHOS 103 06/05/2022   BILITOT 1.2 06/05/2022   Lab Results  Component Value Date   HGBA1C 7.6 (H) 10/19/2021    Home Medications    Current Outpatient Medications  Medication Sig Dispense Refill   albuterol (VENTOLIN HFA) 108 (90 Base) MCG/ACT inhaler Inhale 2 puffs into the lungs every 6 (six) hours as needed for wheezing or shortness of breath. 8 g 6   apixaban (ELIQUIS) 5 MG TABS tablet Take 5 mg by mouth 2 (two) times daily.     atorvastatin (LIPITOR) 40 MG tablet Take 40 mg by  mouth daily.     carvedilol (COREG) 25 MG tablet Take 1 tablet (25 mg total) by mouth 2 (two) times daily. 180 tablet 3   cloNIDine (CATAPRES) 0.1 MG tablet Take 1 tablet by mouth twice daily.  May take extra 0.1 mg daily for systolic pressure > 150. 180 tablet 2   dapagliflozin propanediol (FARXIGA) 10 MG TABS tablet Take 1 tablet (10 mg total) by mouth daily. 30 tablet 1   furosemide (LASIX) 40 MG tablet Take 1 tablet (40 mg total) by mouth daily. 30 tablet 1   irbesartan (AVAPRO) 150 MG tablet Take 0.5 tablets (75 mg total) by mouth daily. (Patient taking differently: Take 150 mg by mouth daily.) 90 tablet 3   loratadine (CLARITIN) 10 MG tablet Take 10 mg by mouth daily as needed for allergies.     metFORMIN (GLUCOPHAGE-XR) 500 MG 24 hr tablet Take 500 mg by mouth daily.     ondansetron (ZOFRAN-ODT) 4 MG disintegrating tablet Take 1 tablet (4 mg total) by mouth every 8 (eight) hours as needed for nausea or vomiting. (Patient not taking: Reported on 07/30/2022) 20 tablet 0   Vitamin D, Ergocalciferol, (DRISDOL) 1.25 MG (50000 UNIT) CAPS capsule Take 50,000 Units by mouth every Sunday.     No current facility-administered medications for this visit.       Assessment & Plan    Essential hypertension Assessment: BP is un/controlled in office BP 137/87 mmHg;  above the goal (<130/80). Tolerates current medications well without any side effects Took extra clonidine dose x 2 in past month for systolic BP > 150 Denies SOB, palpitation, chest pain, headaches,or swelling Reiterated the importance of regular exercise and low salt diet   Plan:  Increase irbesartan to 300 mg once daily Continue taking carvedilol and clonidine both twice daily Continue with extra dose of clonidine for systolic pressure > 150 Patient to keep record of BP readings with heart rate and report to [place] at the next visit Patient to follow up with Dr. Wyline Mood in September  Labs ordered today:  BMET in 10-14 days If kidney  function does not tolerate the increase in irbesartan, will try increasing dose of clonidine   Phillips Hay PharmD CPP [facility]  [address] [place], [place] [address] [phone]

## 2022-11-02 ENCOUNTER — Other Ambulatory Visit (HOSPITAL_COMMUNITY)
Admission: RE | Admit: 2022-11-02 | Discharge: 2022-11-02 | Disposition: A | Discharge status: Home / Self Care | Payer: Medicare HMO | Source: Ambulatory Visit | Attending: Internal Medicine | Admitting: Internal Medicine

## 2022-11-02 ENCOUNTER — Encounter: Payer: Self-pay | Admitting: Pharmacist Clinician (PhC)/ Clinical Pharmacy Specialist

## 2022-11-02 DIAGNOSIS — I10 Essential (primary) hypertension: Secondary | ICD-10-CM | POA: Insufficient documentation

## 2022-11-02 LAB — BASIC METABOLIC PANEL
Anion gap: 8 (ref 5–15)
BUN: 27 mg/dL — ABNORMAL HIGH (ref 8–23)
CO2: 30 mmol/L (ref 22–32)
Calcium: 9.2 mg/dL (ref 8.9–10.3)
Chloride: 101 mmol/L (ref 98–111)
Creatinine, Ser: 1.63 mg/dL — ABNORMAL HIGH (ref 0.61–1.24)
GFR, Estimated: 45 mL/min — ABNORMAL LOW (ref >60)
Glucose, Bld: 156 mg/dL — ABNORMAL HIGH (ref 70–99)
Potassium: 4.6 mmol/L (ref 3.5–5.1)
Sodium: 139 mmol/L (ref 135–145)

## 2022-11-07 IMAGING — MR MR MRA HEAD W/O CM
1 series · 18 of 48 positions shown · IV contrast (gadavist)
Comparison: Brain MRI 07/06/2021.

CLINICAL DATA: CVA.  Stroke, follow-up.

EXAM:
MRA HEAD WITHOUT CONTRAST
MRA NECK WITHOUT AND WITH CONTRAST
TECHNIQUE: Angiographic images of the Circle of Willis were acquired using MRA
technique without intravenous contrast. Angiographic images of the
neck were acquired using MRA technique without and with intravenous
contrast. Carotid stenosis measurements (when applicable) are
obtained utilizing NASCET criteria, using the distal internal
carotid diameter as the denominator.
CONTRAST:  10mL GADAVIST GADOBUTROL 1 MMOL/ML IV SOLN

[Series 100: TOF · axial · 0.4mm · 0.43mm/px · z∈[-23,+63]mm · 18 of 232 slices shown]
[im 1/232]
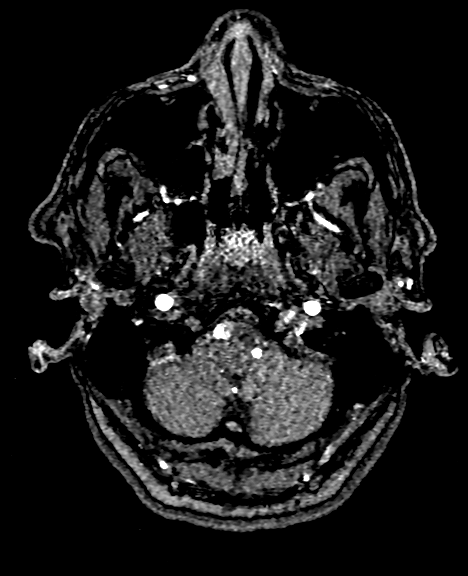
[im 5/232]
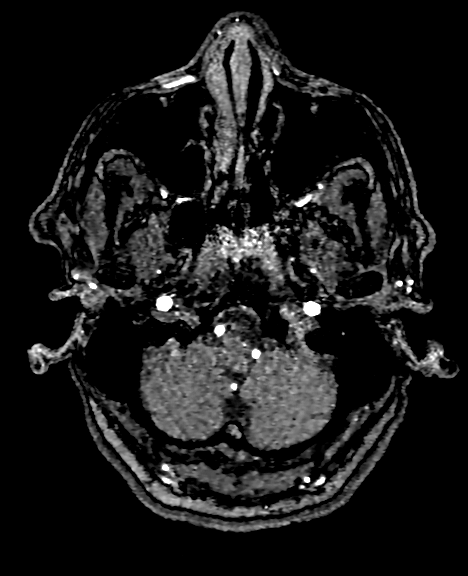
[im 10/232]
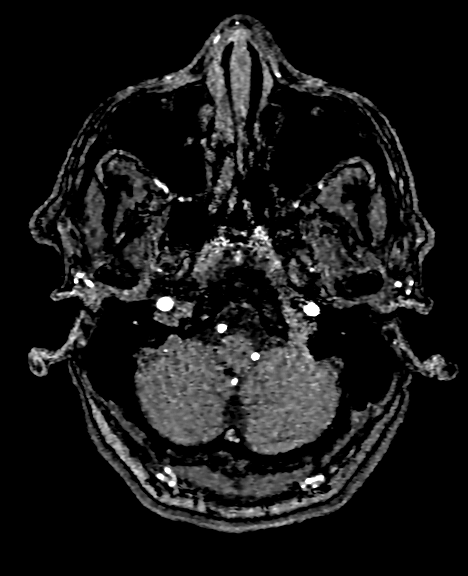
[im 15/232]
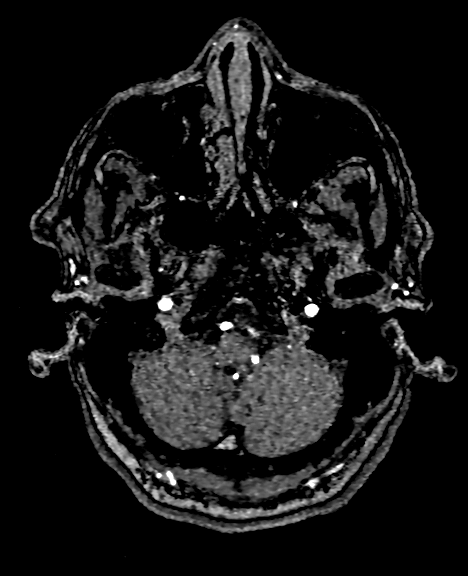
[im 20/232]
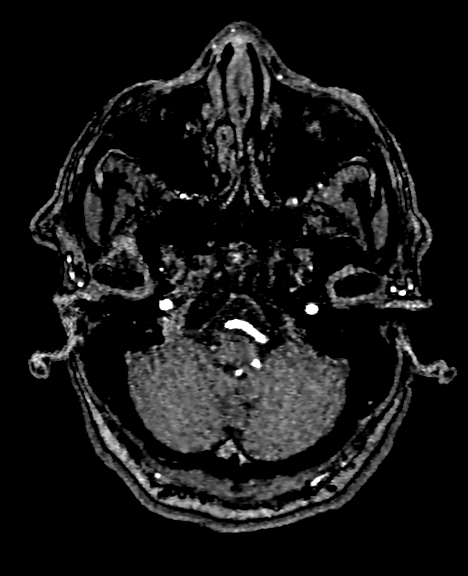
[im 25/232]
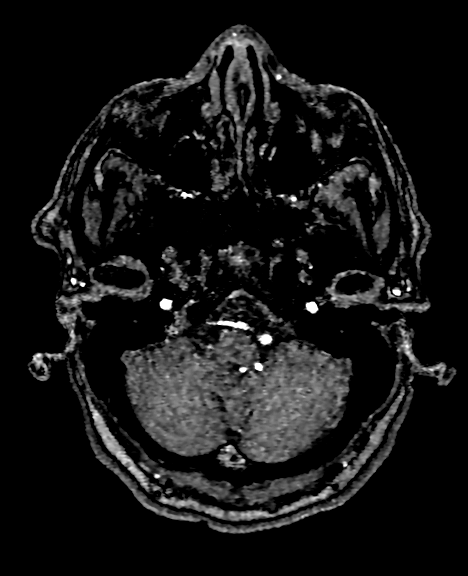
[im 30/232]
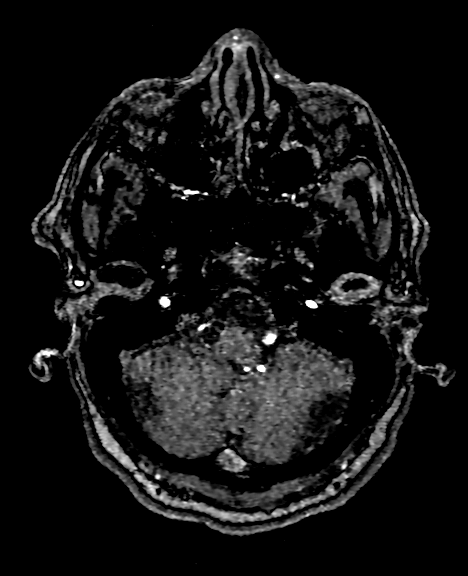
[im 35/232]
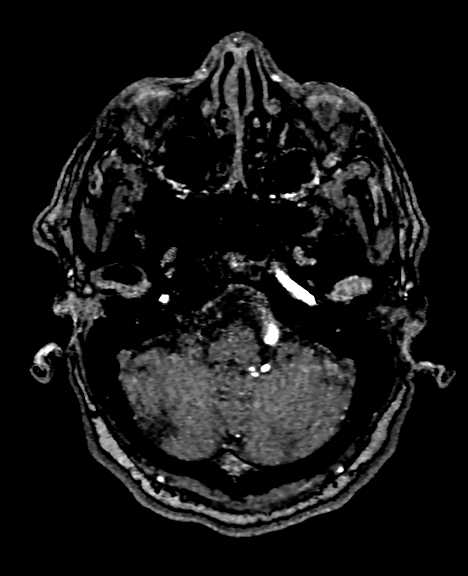
[im 40/232]
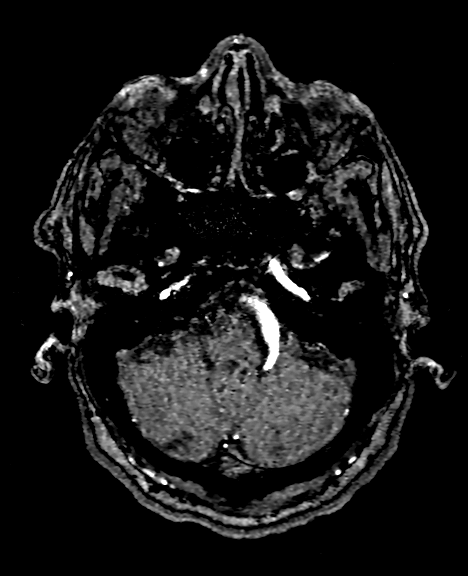
[im 45/232]
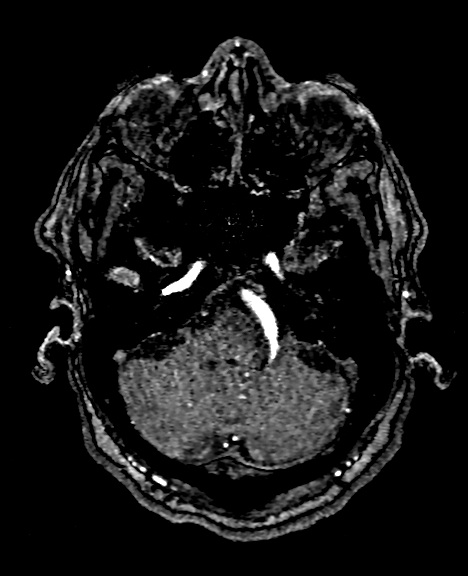
[im 74/232]
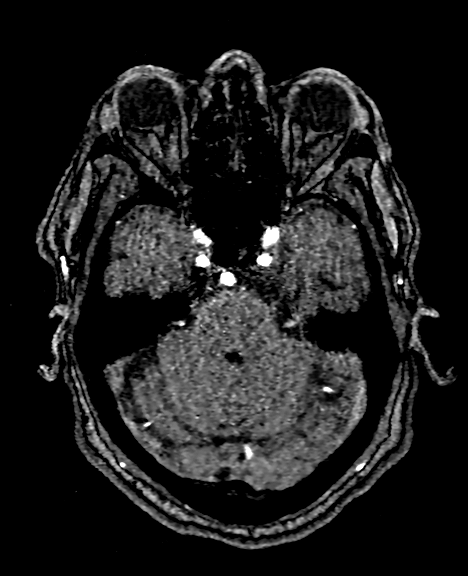
[im 104/232]
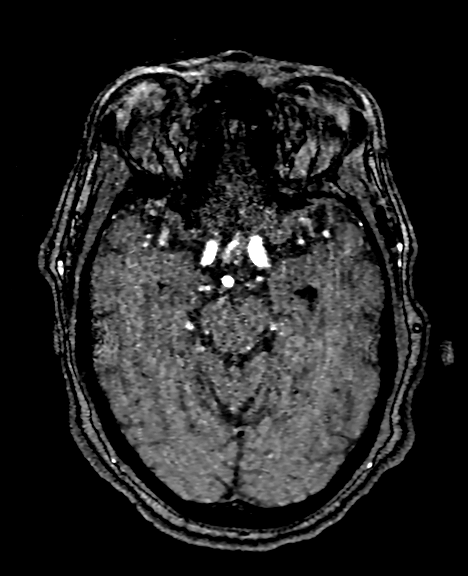
[im 118/232]
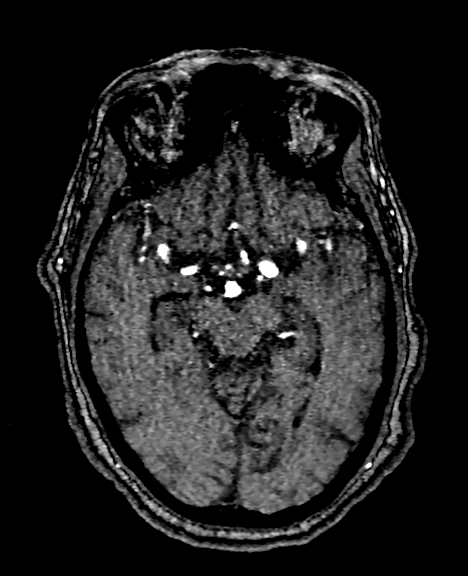
[im 133/232]
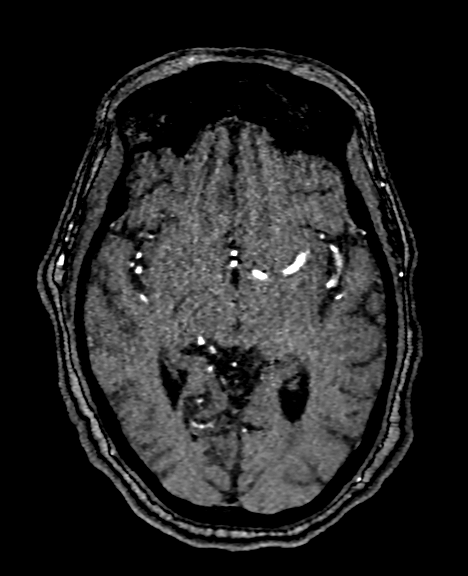
[im 163/232]
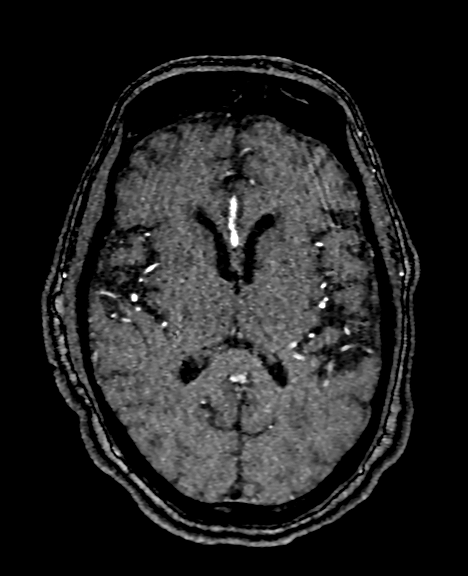
[im 192/232]
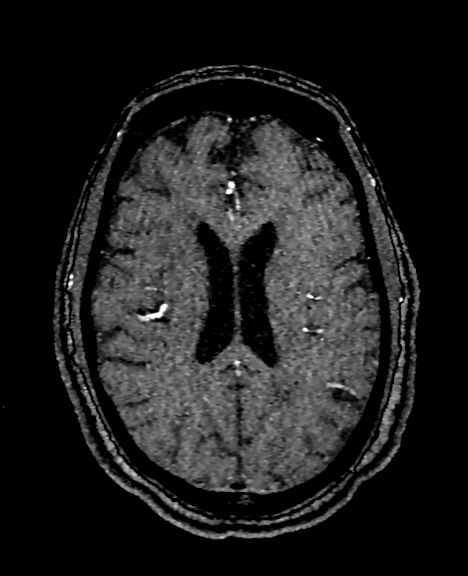
[im 197/232]
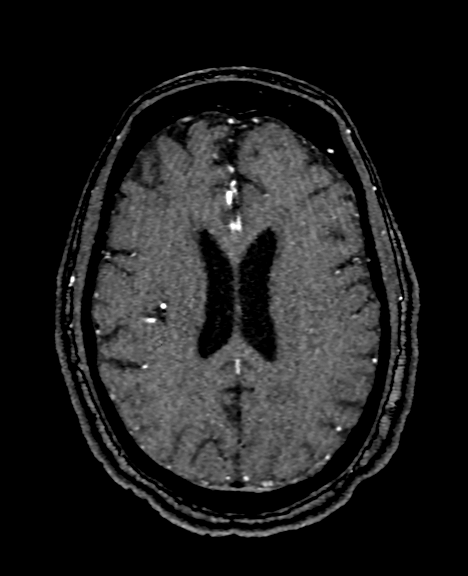
[im 222/232]
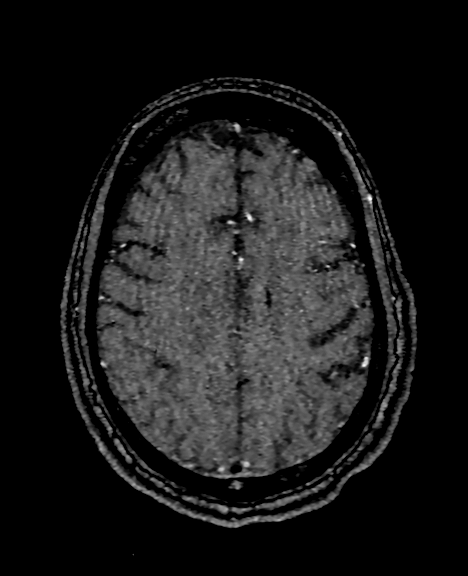

[18 of 48 positions shown; findings below may reference images not displayed]

FINDINGS: MRA HEAD FINDINGS

Anterior circulation:

Anterior circulation dolichoectasia. The intracranial internal
carotid arteries are patent. Mild atherosclerotic irregularity of
both vessels. The M1 middle cerebral arteries are patent. Artifact
limits evaluation of the distal M1 and proximal M2 MCA vessels,
bilaterally. Within this limitation, no M2 proximal branch occlusion
or definite high-grade proximal M2 stenosis is identified. The
anterior cerebral arteries are patent. The right A1 segment is
developmentally diminutive or absent.

Posterior circulation:

Dolichoectasia of the basilar artery. The intracranial vertebral
arteries are patent. Flow-related signal is present within the
proximal posteroinferior cerebellar arteries, bilaterally. The
basilar artery is patent. Artifact limits evaluation of the P2
posterior cerebral arteries, bilaterally. Within this limitation,
the posterior cerebral arteries are patent without appreciable
proximal occlusion or high-grade proximal stenosis. A right
posterior communicating artery is present. The left posterior
communicating artery is diminutive or absent.

Anatomic variants: As described.

MRA NECK FINDINGS

Mildly motion degraded exam.

Aortic arch: The origin of the innominate and left common carotid
arteries. The visualized aortic arch is normal in caliber. No
hemodynamically significant innominate or proximal subclavian artery
stenosis.

Right carotid system: CCA and ICA patent within the neck without
appreciable stenosis.

Left carotid system: CCA and ICA patent within the neck without
appreciable stenosis.

Vertebral arteries: The vertebral arteries are codominant and patent
within the neck. A portion of the distal cervical left vertebral
artery is excluded from the field of view. Within this limitation,
there is no appreciable cervical vertebral artery stenosis.

Other: None
IMPRESSION: MRA head:

1. Artifact limits evaluation of the distal M1 and proximal M2
middle cerebral arteries, bilaterally.
2. Artifact also limits evaluation of the P2 posterior cerebral
arteries, bilaterally.
3. No intracranial large vessel occlusion or proximal high-grade
arterial stenosis identified.
4. Mild atherosclerotic irregularity of the intracranial internal
carotid arteries.
5. Anterior and posterior circulation dolichoectasia, suggesting
longstanding hypertension.

MRA neck:

1. The common carotid and internal carotid arteries are patent
within the neck without appreciable stenosis.
2. The vertebral arteries are codominant and patent within the neck.
A portion of the distal cervical left vertebral artery is excluded
from the field of view. Within this limitation, there is no
appreciable cervical vertebral artery stenosis.

## 2022-11-23 ENCOUNTER — Telehealth: Payer: Self-pay | Admitting: Internal Medicine

## 2022-11-23 NOTE — Telephone Encounter (Signed)
Pt's wife would like a callback regarding pt having "watery" diarrhea. She seems to think that it may come from medication adjustments and would like to know what pt is able to take. Please advise

## 2022-11-23 NOTE — Telephone Encounter (Signed)
Returned pt call in regards to his "watery" diarrhea. Patient wife was advised to call his family doctor. Patient verbalized understanding.

## 2022-11-24 IMAGING — DX DG CHEST 1V PORT
1 series · 1 of 1 positions shown · non-contrast
Comparison: 07/06/2021

CLINICAL DATA: Status post bronchoscopy with biopsy, left upper
lobe mass

EXAM:
PORTABLE CHEST 1 VIEW

[chest]
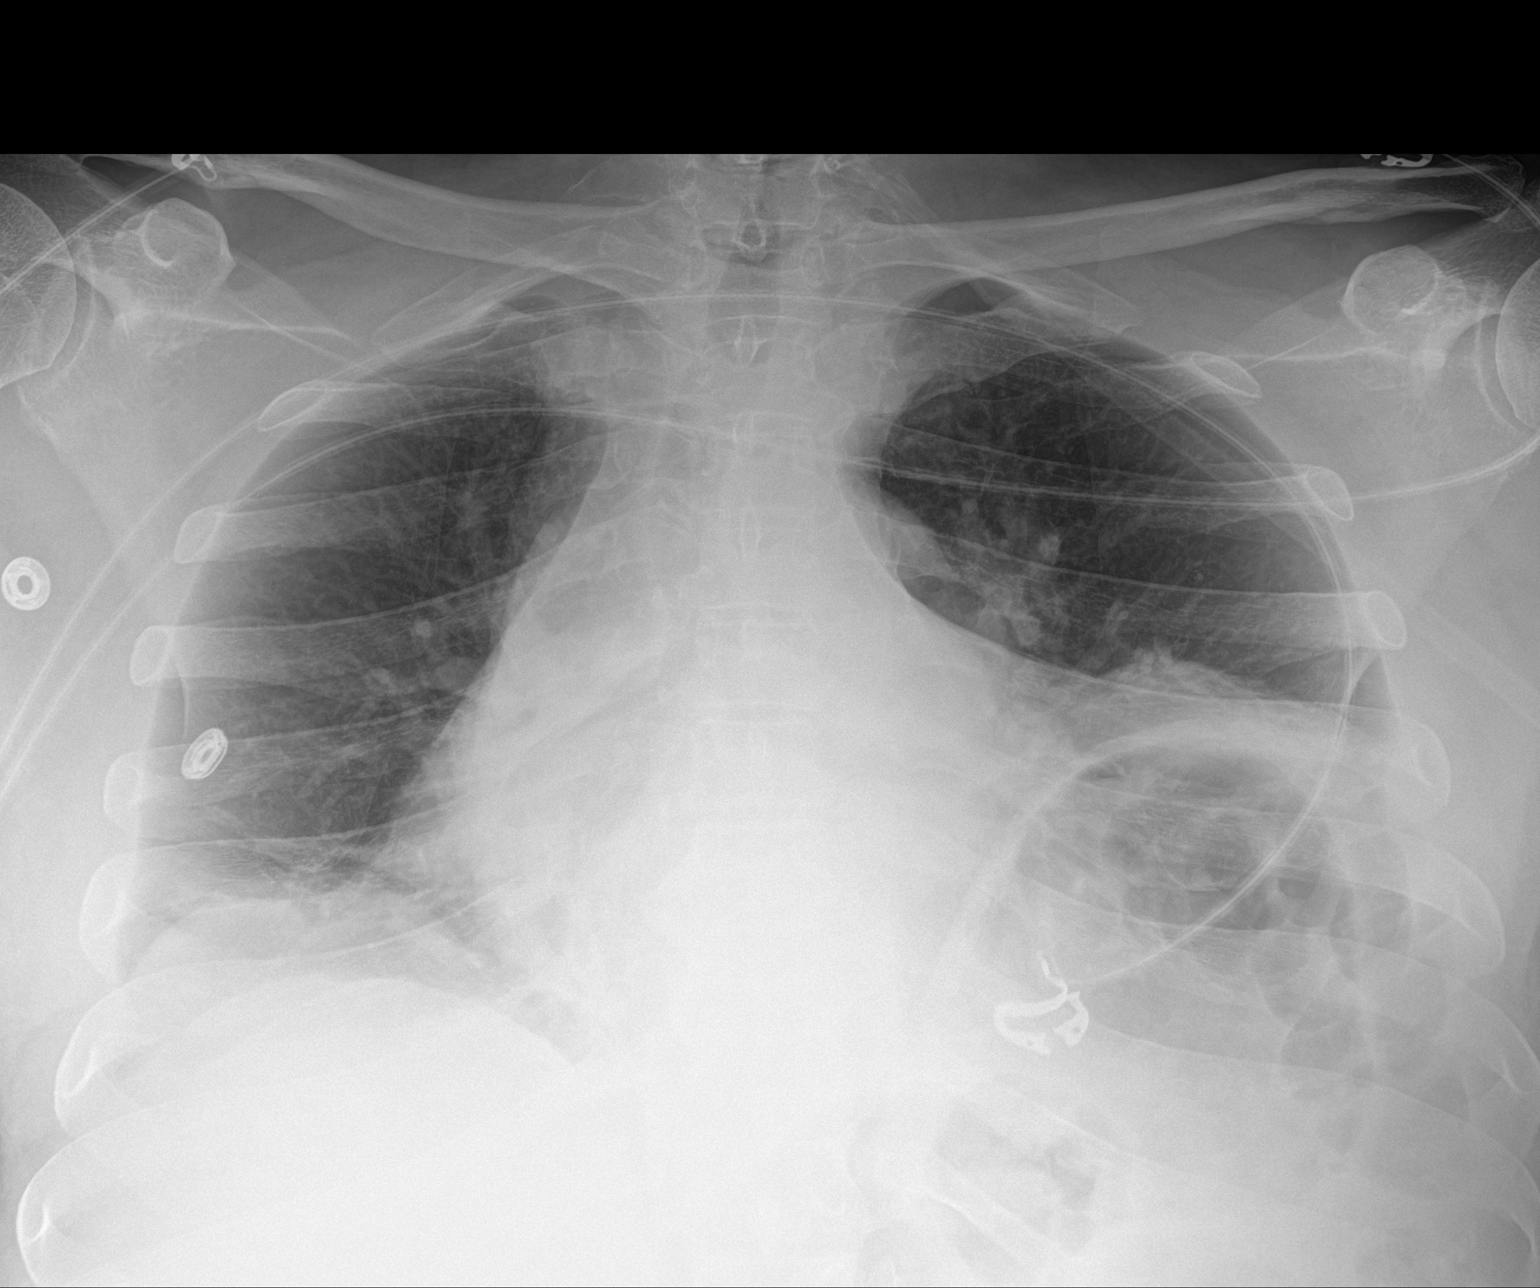

[1 of 1 positions shown; findings below may reference images not displayed]

FINDINGS: Single frontal view of the chest demonstrates an unremarkable
cardiac silhouette. Lung volumes are diminished, with chronic
elevation of the left hemidiaphragm. The left upper lobe mass
abutting the major fissure on prior study is again identified.
Hypoventilatory changes are seen at the lung bases, with lower lobe
atelectasis. No new airspace disease, effusion, or pneumothorax
identified.
IMPRESSION: 1. Low lung volumes with bilateral lower lobe atelectasis.
2. No complication after bronchoscopy and biopsy. No evidence of
pneumothorax.
3. Stable left upper lobe mass abutting the major fissure.

## 2022-11-28 ENCOUNTER — Telehealth: Payer: Self-pay | Admitting: Internal Medicine

## 2022-11-28 NOTE — Telephone Encounter (Signed)
Pt c/o medication issue:  1. Name of Medication: irbesartan (AVAPRO) 150 MG tablet   2. How are you currently taking this medication (dosage and times per day)?   3. Are you having a reaction (difficulty breathing--STAT)?   4. What is your medication issue? Patient states he was seen by pharmD on 7/12 and this medication was increased to 300mg , and labs were going to be checked to see how his kidneys were doing on the medication.  He had his labs done and wants to know if he needs to continue taking the 300mg  or if medication is going to be changed.

## 2022-11-28 NOTE — Telephone Encounter (Signed)
Spoke with wife as his number goes to her.  Advised we will send message to pharmacist to look at his lab results and recommendations regarding medications (per note 7/26)

## 2022-11-30 ENCOUNTER — Telehealth: Payer: Self-pay | Admitting: Internal Medicine

## 2022-11-30 MED ORDER — IRBESARTAN 300 MG PO TABS: 300.0000 mg | ORAL_TABLET | Freq: Every day | ORAL | 3 refills | Status: DC

## 2022-11-30 NOTE — Telephone Encounter (Signed)
   Pt c/o medication issue:  1. Name of Medication: irbesartan (AVAPRO) 300 MG tablet   2. How are you currently taking this medication (dosage and times per day)?   3. Are you having a reaction (difficulty breathing--STAT)?    4. What is your medication issue?    Patient called answering service stating that he would like to speak to one of the pharmacist regarding the increase in his irbesartan (AVAPRO) 300 MG tablet

## 2022-11-30 NOTE — Telephone Encounter (Signed)
Patient states pharmacy already called and answered his questions

## 2022-11-30 NOTE — Telephone Encounter (Signed)
Reviewed labs with patient, stay on irbesartan 300 mg daily, new rx sent to pharmacy

## 2022-12-12 DIAGNOSIS — E1122 Type 2 diabetes mellitus with diabetic chronic kidney disease: Secondary | ICD-10-CM | POA: Diagnosis not present

## 2022-12-12 DIAGNOSIS — E118 Type 2 diabetes mellitus with unspecified complications: Secondary | ICD-10-CM | POA: Diagnosis not present

## 2022-12-12 DIAGNOSIS — E782 Mixed hyperlipidemia: Secondary | ICD-10-CM | POA: Diagnosis not present

## 2022-12-12 DIAGNOSIS — E559 Vitamin D deficiency, unspecified: Secondary | ICD-10-CM | POA: Diagnosis not present

## 2022-12-13 LAB — LAB REPORT - SCANNED
A1c: 8
Albumin, Urine POC: 49
Albumin/Creatinine Ratio, Urine, POC: 42
Creatinine, POC: 115.5 mg/dL

## 2022-12-18 DIAGNOSIS — E1165 Type 2 diabetes mellitus with hyperglycemia: Secondary | ICD-10-CM | POA: Diagnosis not present

## 2022-12-18 DIAGNOSIS — N181 Chronic kidney disease, stage 1: Secondary | ICD-10-CM | POA: Diagnosis not present

## 2022-12-18 DIAGNOSIS — N1831 Chronic kidney disease, stage 3a: Secondary | ICD-10-CM | POA: Diagnosis not present

## 2022-12-18 DIAGNOSIS — I129 Hypertensive chronic kidney disease with stage 1 through stage 4 chronic kidney disease, or unspecified chronic kidney disease: Secondary | ICD-10-CM | POA: Diagnosis not present

## 2022-12-18 DIAGNOSIS — I10 Essential (primary) hypertension: Secondary | ICD-10-CM | POA: Diagnosis not present

## 2022-12-18 DIAGNOSIS — E669 Obesity, unspecified: Secondary | ICD-10-CM | POA: Diagnosis not present

## 2022-12-18 DIAGNOSIS — I712 Thoracic aortic aneurysm, without rupture, unspecified: Secondary | ICD-10-CM | POA: Diagnosis not present

## 2022-12-18 DIAGNOSIS — R600 Localized edema: Secondary | ICD-10-CM | POA: Diagnosis not present

## 2022-12-18 DIAGNOSIS — E782 Mixed hyperlipidemia: Secondary | ICD-10-CM | POA: Diagnosis not present

## 2022-12-24 ENCOUNTER — Ambulatory Visit: Payer: Medicare HMO | Admitting: Internal Medicine

## 2023-02-21 ENCOUNTER — Encounter: Payer: Self-pay | Admitting: Internal Medicine

## 2023-02-21 ENCOUNTER — Other Ambulatory Visit: Payer: Self-pay

## 2023-02-21 ENCOUNTER — Ambulatory Visit: Payer: Medicare HMO | Attending: Internal Medicine | Admitting: Internal Medicine

## 2023-02-21 VITALS — BP 124/84 | HR 59 | Ht 73.0 in | Wt 235.2 lb

## 2023-02-21 DIAGNOSIS — I10 Essential (primary) hypertension: Secondary | ICD-10-CM

## 2023-02-21 DIAGNOSIS — Z5986 Financial insecurity: Secondary | ICD-10-CM

## 2023-02-21 MED ORDER — APIXABAN 5 MG PO TABS: 5.0000 mg | ORAL_TABLET | Freq: Two times a day (BID) | ORAL | 0 refills | Status: DC

## 2023-02-21 NOTE — Patient Instructions (Signed)
Medication Instructions:  No changes  *If you need a refill on your cardiac medications before your next appointment, please call your pharmacy*   Lab Work: None needed  at this time  If you have labs (blood work) drawn today and your tests are completely normal, you will receive your results only by: MyChart Message (if you have MyChart) OR A paper copy in the mail If you have any lab test that is abnormal or we need to change your treatment, we will call you to review the results.   Testing/Procedures: None ordered at this appointment    Follow-Up: At [facility], you and your health needs are our priority.  As part of our continuing mission to provide you with exceptional heart care, we have created designated Provider Care Teams.  These Care Teams include your primary Cardiologist (physician) and Advanced Practice Providers (APPs -  Physician Assistants and Nurse Practitioners) who all work together to provide you with the care you need, when you need it.  Your next appointment:   6 month(s)  Provider:   Maisie Fus, MD

## 2023-02-21 NOTE — Progress Notes (Signed)
Cardiology Office Note:    Date:  02/21/2023   ID:  Chris Lucas, DOB 1950-12-01, MRN [id number]  PCP:  Benita Stabile, MD   [facility] Providers Cardiologist:  Maisie Fus, MD     Referring MD: Benita Stabile, MD   No chief complaint on file. Establish care  History of Present Illness:    Chris Lucas is a 72 y.o. 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 He had transient LOC for 30 seconds. He felt nauseas at this time after eating. EKG was notable for typical atrial flutter with variable block. LOC cause was unknown; unlikely cardiac.  For his atrial flutter he was started on eliquis.  At this time he had an incidental finding of a lung mass c/f malignancy. He had biopsy + for carcinoid tumor. He was also noted to have 4.8 cm ascending thoracic aortic aneurysm.  Today, he presents for follow up. He's had no recurrence of his symptoms of LOC. No chest pressure or SOB. He lifts weights.TSH is normal. He has apneic events per his wife while sleeping  Otherwise he notes he had a cath 20 years ago. He notes allergy to contrast dye.   He was seen by Dr. Cliffton Asters for resection of his carcinoid tumor recently. Recommended PFTs prior to procedure.  Does not have an oncologist.   Interim Hx 11/23/2021:  He is s/p successful DCCV in May 2023. 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 Cliffton Asters. He is on 2L O2. He has an appointment with medical oncology  He had LE and found to be atrial flutter again later in July.He was diuresed net negative 8.5L. He was continued on lasix 40 mg daily. Norvasc was held. Plan was to consider outpatient DCCV  He does not exert himself extensively.  He denies palpitations. No LH. He remains in atrial flutter  Interim 06/27/2022 He was  seen by his oncologist Dr. Arbutus Ped. His blood pressures were significantly high and he received a dose of clonidine. BP 180/110. His blood pressure is 180/104. Has do 14 steps in his house, can be winded at the top but that resolves.   Interim hx 02/21/2023 Chris Lucas comes in today with his wife.  He notes that his blood pressure can fluctuate at home.  Today, his blood pressure is well-controlled.  Today he is in sinus rhythm.  Current Medications: Current Outpatient Medications on File Prior to Visit  Medication Sig Dispense Refill   albuterol (VENTOLIN HFA) 108 (90 Base) MCG/ACT inhaler Inhale 2 puffs into the lungs every 6 (six) hours as needed for wheezing or shortness of breath. 8 g 6   apixaban (ELIQUIS) 5 MG TABS tablet Take 5 mg by mouth 2 (two) times daily.     atorvastatin (LIPITOR) 40 MG tablet Take 40 mg by mouth daily.     carvedilol (COREG) 25 MG tablet Take 1 tablet (25 mg total) by mouth 2 (two) times daily. 180 tablet 3   cloNIDine (CATAPRES) 0.1 MG tablet Take 1 tablet by mouth twice daily.  May take extra 0.1 mg daily for systolic pressure > 150. 180 tablet 2   dapagliflozin propanediol (FARXIGA) 10 MG TABS tablet Take 1 tablet (10 mg total) by mouth daily. 30 tablet 1   furosemide (LASIX) 40 MG  tablet Take 1 tablet (40 mg total) by mouth daily. 30 tablet 1   irbesartan (AVAPRO) 300 MG tablet Take 1 tablet (300 mg total) by mouth daily. 90 tablet 3   loratadine (CLARITIN) 10 MG tablet Take 10 mg by mouth daily as needed for allergies.     metFORMIN (GLUCOPHAGE-XR) 500 MG 24 hr tablet Take 500 mg by mouth daily.     ondansetron (ZOFRAN-ODT) 4 MG disintegrating tablet Take 1 tablet (4 mg total) by mouth every 8 (eight) hours as needed for nausea or vomiting. 20 tablet 0   Vitamin D, Ergocalciferol, (DRISDOL) 1.25 MG (50000 UNIT) CAPS capsule Take 50,000 Units by mouth every Sunday.     No current facility-administered medications on file prior to visit.   Family  History: The patient's family history includes Aneurysm in his sister; Diabetes in his mother and sister; Pancreatic cancer in his sister; Stroke in his father.  ROS:   Please see the history of present illness.     All other systems reviewed and are negative.  EKGs/Labs/Other Studies Reviewed:    The following studies were reviewed today:   EKG:  EKG is  ordered today.  The ekg ordered today demonstrates   Atrial flutter with variable AV block 74 bpm  11/23/2021- atrial flutter 4:1 AV block  EKG Interpretation Date/Time:  Thursday February 21 2023 08:46:49 EST Ventricular Rate:  59 PR Interval:  254 QRS Duration:  102 QT Interval:  480 QTC Calculation: 475 R Axis:   13  Text Interpretation: Sinus bradycardia with 1st degree A-V block with Premature atrial complexes ST & T wave abnormality, consider inferior ischemia ST & T wave abnormality, consider anterolateral ischemia Prolonged QT When compared with ECG of 19-Oct-2021 13:47, Sinus rhythm has replaced Atrial flutter Questionable change in QRS axis Confirmed by Carolan Clines ([id number]) on 02/21/2023 8:51:53 AM    Cardiology Studies 07/07/2021: TTE- EF normal.  LA vol index 42 cc/m2, no valve disease  Recent Labs: 06/05/2022: ALT 21; Hemoglobin 16.9; Platelet Count 191 11/02/2022: BUN 27; Creatinine, Ser 1.63; Potassium 4.6; Sodium 139  Recent Lipid Panel    Component Value Date/Time   CHOL 157 07/07/2021 0424   TRIG 109 07/07/2021 0424   HDL 41 07/07/2021 0424   CHOLHDL 3.8 07/07/2021 0424   VLDL 22 07/07/2021 0424   LDLCALC 94 07/07/2021 0424     Risk Assessment/Calculations:           Physical Exam:    VS:   Vitals:   02/21/23 0838  BP: 124/84  Pulse: (!) 59  SpO2: 97%      Wt Readings from Last 3 Encounters:  02/21/23 235 lb 3.2 oz (106.7 kg)  08/16/22 242 lb (109.8 kg)  07/30/22 243 lb (110.2 kg)     GEN:  Well nourished, well developed in no acute distress. Wearing supplemental O2 HEENT:  Normal NECK: No JVD; No carotid bruits LYMPHATICS: No lymphadenopathy CARDIAC: RRR, no murmurs, rubs, gallops RESPIRATORY:  Clear to auscultation without rales, wheezing or rhonchi  ABDOMEN: Soft, non-tender, non-distended MUSCULOSKELETAL:  No edema; No deformity  SKIN: Warm and dry NEUROLOGIC:  Alert and oriented x 3 PSYCHIATRIC:  Normal affect   ASSESSMENT:    Atrial Flutter: atypical flutter variable AV block. new onset March 2023.  Flutter looks more atypical unlikely amenable to CTI line. He is rate controlled. DCCV May 2023 successful. Has had recurrence post surgery. Recommended pulm for sleep study last visit. Discussed repeat DCCV before, he was hesitant.  I said it was reasonable to continue rate control -He is in sinus rhythm - continue eliquis 5 mg BID - continue coreg  HTN: Better controlled. Notes does have difficulty taking meds as prescribed each day. BP goal closer 130/80 mmHg with aneurysm. Was on norvasc 10 mg , held with LE edema.  -Continue current antihypertensive regimen above  Dependant edema:  continue 40 mg daily  HLD: atorvastatin 40 mg daily  Aortic ascending aneurysm : stable. 4.5 cm.  Plan for repeat CT scan early next year  CKD 3: ~Crt 1.4. Stable  CardioOnc: with carcinoid tumor, will assess for symptoms of right sided valve dx with low threshold for repeat TTE. He is planned to see Dr. Si Gaul.  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 FU scans without recurrence  PLAN:    In order of problems listed above:   CT Chest WO Contrast pending Follow up in 6 months      Medication Adjustments/Labs and Tests Ordered: Current medicines are reviewed at length with the patient today.  Concerns regarding medicines are outlined above.  Orders Placed This Encounter  Procedures   EKG 12-Lead   No orders of the defined types were placed in this encounter.   There are no Patient Instructions on file for this visit.    Signed, Maisie Fus, MD  02/21/2023 8:50 AM    [facility]

## 2023-02-21 NOTE — Progress Notes (Signed)
356

## 2023-02-21 NOTE — Progress Notes (Signed)
   02/21/2023  Patient ID: Chris Lucas, male   DOB: 10-29-1950, 72 y.o.   MRN: [id number]   2025 Medication Assistance Renewal Application Summary:  Patient was outreached regarding medication assistance renewal for 2025. Verified address, anticipated insurance for 2025, and income has not changed. Patient remains interested in PAP for 2025 for Eliquis and Farxiga, no other new medications were identified for medication assistance.    Medication Review Findings:  Eliquis - will have to meet 3% OOP in 2025 Farxiga - he has not completed the application on his own   Medication Assistance Findings:  Medication assistance needs identified: Marcelline Deist for now     Additional medication assistance options reviewed with patient as warranted:  No other options identified  Plan: I will route patient assistance letter to pharmacy technician who will coordinate patient assistance program application process for medications listed above.  Pharmacy technician will assist with obtaining all required documents from both patient and provider(s) and submit application(s) once completed.    Thank you for allowing pharmacy to be a part of this patient's care.     Cephus Shelling, PharmD Clinical Pharmacist Cell: [phone]

## 2023-03-15 ENCOUNTER — Telehealth: Payer: Self-pay | Admitting: Pharmacy Technician

## 2023-03-15 DIAGNOSIS — Z5986 Financial insecurity: Secondary | ICD-10-CM

## 2023-03-15 NOTE — Progress Notes (Signed)
Pharmacy Medication Assistance Program Note    03/15/2023  Patient ID: Chris Lucas, male   DOB: 1950/12/06, 72 y.o.   MRN: [id number]     03/15/2023  Outreach Medication One  Initial Outreach Date (Medication One) 03/13/2023  Manufacturer Medication One [employer]  [employer] Farxiga  Dose of Farxiga 10mg   Type of Radiographer, therapeutic Assistance  Date Application Sent to Patient 03/13/2023  Application Items Requested Application;Proof of Income;Other  Date Application Sent to Prescriber 03/20/2023  Name of Prescriber [facility]       Signature   Kristopher Glee [facility]  Office: [phone] Fax: [phone] Email: [email or web address]

## 2023-03-22 DIAGNOSIS — E559 Vitamin D deficiency, unspecified: Secondary | ICD-10-CM | POA: Diagnosis not present

## 2023-03-22 DIAGNOSIS — E118 Type 2 diabetes mellitus with unspecified complications: Secondary | ICD-10-CM | POA: Diagnosis not present

## 2023-03-22 DIAGNOSIS — E1122 Type 2 diabetes mellitus with diabetic chronic kidney disease: Secondary | ICD-10-CM | POA: Diagnosis not present

## 2023-03-22 DIAGNOSIS — E782 Mixed hyperlipidemia: Secondary | ICD-10-CM | POA: Diagnosis not present

## 2023-03-24 LAB — LAB REPORT - SCANNED
A1c: 7.9
Albumin, Urine POC: 31.7
Creatinine, POC: 109.3 mg/dL
EGFR: 52
Microalb Creat Ratio: 29

## 2023-03-28 DIAGNOSIS — R195 Other fecal abnormalities: Secondary | ICD-10-CM | POA: Diagnosis not present

## 2023-03-28 DIAGNOSIS — R809 Proteinuria, unspecified: Secondary | ICD-10-CM | POA: Diagnosis not present

## 2023-03-28 DIAGNOSIS — I10 Essential (primary) hypertension: Secondary | ICD-10-CM | POA: Diagnosis not present

## 2023-03-28 DIAGNOSIS — I712 Thoracic aortic aneurysm, without rupture, unspecified: Secondary | ICD-10-CM | POA: Diagnosis not present

## 2023-03-28 DIAGNOSIS — C7A090 Malignant carcinoid tumor of the bronchus and lung: Secondary | ICD-10-CM | POA: Diagnosis not present

## 2023-03-28 DIAGNOSIS — R600 Localized edema: Secondary | ICD-10-CM | POA: Diagnosis not present

## 2023-03-28 DIAGNOSIS — I4892 Unspecified atrial flutter: Secondary | ICD-10-CM | POA: Diagnosis not present

## 2023-03-28 DIAGNOSIS — E669 Obesity, unspecified: Secondary | ICD-10-CM | POA: Diagnosis not present

## 2023-03-28 DIAGNOSIS — Z0001 Encounter for general adult medical examination with abnormal findings: Secondary | ICD-10-CM | POA: Diagnosis not present

## 2023-05-09 ENCOUNTER — Telehealth: Payer: Self-pay | Admitting: Pharmacy Technician

## 2023-05-09 DIAGNOSIS — Z5986 Financial insecurity: Secondary | ICD-10-CM

## 2023-05-09 NOTE — Progress Notes (Signed)
Pharmacy Medication Assistance Program Note    05/09/2023  Patient ID: Chris Lucas, male   DOB: 1951-01-16, 73 y.o.   MRN: [id number]     03/15/2023 05/09/2023  Outreach Medication One  Initial Outreach Date (Medication One) 03/13/2023   Manufacturer Medication One [employer]   [employer] Farxiga   Dose of Farxiga 10mg    Type of Radiographer, therapeutic Assistance   Date Application Sent to Patient 03/13/2023   Application Items Requested Application;Proof of Income;Other   Date Application Sent to Prescriber 03/20/2023   Name of Prescriber Benita Stabile   Application Items Received From Patient  Other  Date Application Received From Provider  03/22/2023  Date Application Submitted to Manufacturer  05/09/2023  Method Application Sent to Manufacturer  Fax  Patient Assistance Determination  Approved  Approval Start Date  08/10/2022  Approval End Date  11/09/2023   Care coordination call placed to [employer] in regard to [facility] application.  Per IVR system, patient is APPROVED from 08/10/22-11/09/23. Patient can re enroll starting 08/11/23.  Will route message to [facility] PharmD Cephus Shelling to f/u with patient about re enrollment at that time.   Pattricia Boss, CPhT [place]  Office: [phone] Fax: [phone] Email: [email or web address]

## 2023-06-06 ENCOUNTER — Telehealth: Payer: Self-pay | Admitting: Internal Medicine

## 2023-06-06 ENCOUNTER — Ambulatory Visit (HOSPITAL_COMMUNITY)
Admission: RE | Admit: 2023-06-06 | Discharge: 2023-06-06 | Disposition: A | Discharge status: Home / Self Care | Payer: Medicare HMO | Source: Ambulatory Visit | Attending: Internal Medicine | Admitting: Internal Medicine

## 2023-06-06 ENCOUNTER — Inpatient Hospital Stay: Payer: Medicare HMO | Attending: Internal Medicine

## 2023-06-06 DIAGNOSIS — C7A090 Malignant carcinoid tumor of the bronchus and lung: Secondary | ICD-10-CM

## 2023-06-06 DIAGNOSIS — J9811 Atelectasis: Secondary | ICD-10-CM | POA: Diagnosis not present

## 2023-06-06 DIAGNOSIS — C7A8 Other malignant neuroendocrine tumors: Secondary | ICD-10-CM | POA: Diagnosis not present

## 2023-06-06 DIAGNOSIS — I7121 Aneurysm of the ascending aorta, without rupture: Secondary | ICD-10-CM | POA: Diagnosis not present

## 2023-06-06 LAB — CBC WITH DIFFERENTIAL (CANCER CENTER ONLY)
Abs Immature Granulocytes: 0.01 10*3/uL (ref 0.00–0.07)
Basophils Absolute: 0 10*3/uL (ref 0.0–0.1)
Basophils Relative: 1 %
Eosinophils Absolute: 0.1 10*3/uL (ref 0.0–0.5)
Eosinophils Relative: 2 %
HCT: 41.6 % (ref 39.0–52.0)
Hemoglobin: 13.7 g/dL (ref 13.0–17.0)
Immature Granulocytes: 0 %
Lymphocytes Relative: 29 %
Lymphs Abs: 1.2 10*3/uL (ref 0.7–4.0)
MCH: 31.4 pg (ref 26.0–34.0)
MCHC: 32.9 g/dL (ref 30.0–36.0)
MCV: 95.2 fL (ref 80.0–100.0)
Monocytes Absolute: 0.5 10*3/uL (ref 0.1–1.0)
Monocytes Relative: 11 %
Neutro Abs: 2.3 10*3/uL (ref 1.7–7.7)
Neutrophils Relative %: 57 %
Platelet Count: 159 10*3/uL (ref 150–400)
RBC: 4.37 MIL/uL (ref 4.22–5.81)
RDW: 12.6 % (ref 11.5–15.5)
WBC Count: 4.1 10*3/uL (ref 4.0–10.5)
nRBC: 0 % (ref 0.0–0.2)

## 2023-06-06 LAB — CMP (CANCER CENTER ONLY)
ALT: 11 U/L (ref 0–44)
AST: 14 U/L — ABNORMAL LOW (ref 15–41)
Albumin: 3.9 g/dL (ref 3.5–5.0)
Alkaline Phosphatase: 103 U/L (ref 38–126)
Anion gap: 2 — ABNORMAL LOW (ref 5–15)
BUN: 23 mg/dL (ref 8–23)
CO2: 34 mmol/L — ABNORMAL HIGH (ref 22–32)
Calcium: 9 mg/dL (ref 8.9–10.3)
Chloride: 105 mmol/L (ref 98–111)
Creatinine: 1.64 mg/dL — ABNORMAL HIGH (ref 0.61–1.24)
GFR, Estimated: 44 mL/min — ABNORMAL LOW
Glucose, Bld: 124 mg/dL — ABNORMAL HIGH (ref 70–99)
Potassium: 4.1 mmol/L (ref 3.5–5.1)
Sodium: 141 mmol/L (ref 135–145)
Total Bilirubin: 1 mg/dL (ref 0.0–1.2)
Total Protein: 6.8 g/dL (ref 6.5–8.1)

## 2023-06-06 NOTE — Telephone Encounter (Signed)
 Patient came to front desk with questions on if he has done all the recommended follow-up visits. All referrals from [facility] had been scheduled and only thing showing on previous AVS states to have 6 month f/u. Patient requesting call back on clarity on what next steps should be.

## 2023-06-10 ENCOUNTER — Inpatient Hospital Stay: Payer: Medicare HMO | Attending: Internal Medicine | Admitting: Internal Medicine

## 2023-06-10 VITALS — BP 135/103 | HR 85 | Temp 98.3°F | Resp 17 | Wt 239.3 lb

## 2023-06-10 DIAGNOSIS — Z902 Acquired absence of lung [part of]: Secondary | ICD-10-CM | POA: Diagnosis not present

## 2023-06-10 DIAGNOSIS — I4891 Unspecified atrial fibrillation: Secondary | ICD-10-CM | POA: Diagnosis not present

## 2023-06-10 DIAGNOSIS — C7A090 Malignant carcinoid tumor of the bronchus and lung: Secondary | ICD-10-CM | POA: Insufficient documentation

## 2023-06-10 DIAGNOSIS — I712 Thoracic aortic aneurysm, without rupture, unspecified: Secondary | ICD-10-CM | POA: Insufficient documentation

## 2023-06-10 NOTE — Telephone Encounter (Signed)
 Spoke with pt, he reports he recently had a CT scan and wanted to make sure Dr Wyline Mood saw it because of the enlarged aorta. Aware will forward CT to dr branch. He also had questions about when to take an extra clonidine if bp was >150. Patient aware to check blood pressure at least 2-3 hours after taking the morning dose and if elevated may take an extra tablet then. He is aware dr branch is leaving, follow up appointment per recall scheduled with the NP to help with transition to new cardiologist. All questions answered.

## 2023-06-10 NOTE — Progress Notes (Signed)
 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  PRIOR THERAPY: Status post left upper lobectomy with lymph node sampling under the care of Dr. Cliffton Asters on October 23, 2021.   CURRENT THERAPY: Observation  INTERVAL HISTORY: Chris Lucas 73 y.o. male returns to the clinic today for follow-up visit accompanied by his wife.Discussed the use of AI scribe software for clinical note transcription with the patient, who gave verbal consent to proceed.  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 He is accompanied by his wife, Chris Lucas.  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 He feels generally well with no significant changes in his health status. He experiences a 'nagging' sensation at the surgical scar site, which is sometimes itchy but not constant. No shortness of breath or cough. A recent chest scan showed no concerning findings for recurrent or metastatic disease. He is not on any medication related to his lung condition.  He has a history of a thoracic aortic aneurysm measuring 4.5 cm, which is stable. He is under the care of a cardiologist for this condition. He is not experiencing any symptoms related to the aneurysm.  He mentions having atrial fibrillation and inquires about its management. He is not currently on any specific treatment for it.       MEDICAL HISTORY: Past Medical History:  Diagnosis Date   Atrial flutter (HCC) 07/06/2021   Diabetes mellitus without complication (HCC)    Dysrhythmia    HLD (hyperlipidemia)    Hypertension    Mass of upper lobe of left lung 07/2021   solid    Pneumonia    Pre-diabetes    diet controlled   Stroke (HCC) 07/06/2021   Thoracic ascending aortic aneurysm (HCC)     ALLERGIES:  is allergic to ivp dye [iodinated contrast media].  MEDICATIONS:  Current Outpatient Medications  Medication Sig Dispense Refill   albuterol (VENTOLIN HFA) 108 (90 Base) MCG/ACT inhaler Inhale 2 puffs into the lungs every 6 (six) hours as needed for wheezing or shortness of breath. 8 g 6   apixaban (ELIQUIS) 5 MG TABS tablet Take 5 mg by mouth 2 (two) times daily.     apixaban (ELIQUIS) 5 MG TABS tablet Take 1 tablet (5 mg total) by mouth 2 (two) times daily. 28 tablet 0   atorvastatin (LIPITOR) 40 MG tablet Take 40 mg by mouth daily.     carvedilol (COREG) 25 MG tablet Take 1 tablet (25 mg total) by mouth 2 (two) times daily. 180 tablet 3   cloNIDine (CATAPRES) 0.1 MG tablet Take 1 tablet by mouth twice daily.  May take extra 0.1 mg daily for systolic pressure > 150. 180 tablet 2   dapagliflozin propanediol (FARXIGA) 10 MG TABS tablet Take 1 tablet (10 mg total) by mouth daily. 30 tablet 1   furosemide (LASIX) 40 MG tablet Take 1 tablet (40 mg total) by mouth daily. 30 tablet 1   irbesartan (AVAPRO) 300 MG tablet Take 1 tablet (300 mg total) by mouth daily. 90 tablet 3   loratadine (CLARITIN) 10 MG  tablet Take 10 mg by mouth daily as needed for allergies.     metFORMIN (GLUCOPHAGE-XR) 500 MG 24 hr tablet Take 500 mg by mouth daily.     ondansetron (ZOFRAN-ODT) 4 MG disintegrating tablet Take 1 tablet (4 mg total) by mouth every 8 (eight) hours as needed for nausea or vomiting. 20 tablet 0   Vitamin D, Ergocalciferol, (DRISDOL) 1.25 MG (50000 UNIT) CAPS capsule Take 50,000 Units by mouth every Sunday.     No current facility-administered medications for this visit.    SURGICAL HISTORY:  Past Surgical History:  Procedure Laterality Date   BRONCHIAL BIOPSY  07/24/2021   Procedure: BRONCHIAL BIOPSIES;  Surgeon: Leslye Peer, MD;  Location: [facility] ENDOSCOPY;   Service: Pulmonary;;   BRONCHIAL BRUSHINGS  07/24/2021   Procedure: BRONCHIAL BRUSHINGS;  Surgeon: Leslye Peer, MD;  Location[facility] END[facility]COPY;  Service: Pulmonary;;   BRONCHIAL NEEDLE ASPIRATION BIOPSY  07/24/2021   Procedure: BRONCHIAL NEEDLE ASPIRATION BIOPSIES;  Surgeon: Leslye Peer, MD;  Location: [facility] ENDOSCOPY;  Service: Pulmonary;;   CARDIAC CATHETERIZATION     "Years ago"  (apporx 18-20 yrs)   CARDIOVERSION N/A 09/01/2021   Procedure: CARDIOVERSION;  Surgeon: Chilton Si, MD;  Location: [facility] ENDOSCOPY;  Service: Cardiovascular;  Laterality: N/A;   colonoscopy     INTERCOSTAL NERVE BLOCK  10/23/2021   Procedure: INTERCOSTAL NERVE BLOCK;  Surgeon: Corliss Skains, MD;  Location: [facility] OR;  Service: Thoracic;;   NODE DISSECTION  10/23/2021   Procedure: NODE DISSECTION;  Surgeon: Corliss Skains, MD;  Location: [facility] OR;  Service: Thoracic;;   VIDEO BRONCHOSCOPY WITH RADIAL ENDOBRONCHIAL ULTRASOUND  07/24/2021   Procedure: VIDEO BRONCHOSCOPY WITH RADIAL ENDOBRONCHIAL ULTRASOUND;  Surgeon: Leslye Peer, MD;  Location: [facility] ENDOSCOPY;  Service: Pulmonary;;    REVIEW OF SYSTEMS:  A comprehensive review of systems was negative except for: Respiratory: positive for dyspnea on exertion and pleurisy/chest pain   PHYSICAL EXAMINATION: General appearance: alert, cooperative, fatigued, and no distress Head: Normocephalic, without obvious abnormality, atraumatic Neck: no adenopathy, no JVD, supple, symmetrical, trachea midline, and thyroid not enlarged, symmetric, no tenderness/mass/nodules Lymph nodes: Cervical, supraclavicular, and axillary nodes normal. Resp: clear to auscultation bilaterally Back: symmetric, no curvature. ROM normal. No CVA tenderness. Cardio: regular rate and rhythm, S1, S2 normal, no murmur, click, rub or gallop GI: soft, non-tender; bowel sounds normal; no masses,  no organomegaly Extremities: extremities normal, atraumatic, no cyanosis or edema  ECOG PERFORMANCE  STATUS: 1 - Symptomatic but completely ambulatory  Blood pressure (!) 135/103, pulse 85, temperature 98.3 F (36.8 C), temperature source Temporal, resp. rate 17, weight 239 lb 4.8 oz (108.5 kg), SpO2 96%.  LABORATORY DATA: Lab Results  Component Value Date   WBC 4.1 06/06/2023   HGB 13.7 06/06/2023   HCT 41.6 06/06/2023   MCV 95.2 06/06/2023   PLT 159 06/06/2023      Chemistry      Component Value Date/Time   NA 141 06/06/2023 1109   NA 143 12/04/2021 1523   K 4.1 06/06/2023 1109   CL 105 06/06/2023 1109   CO2 34 (H) 06/06/2023 1109   BUN 23 06/06/2023 1109   BUN 28 (H) 12/04/2021 1523   CREATININE 1.64 (H) 06/06/2023 1109      Component Value Date/Time   CALCIUM 9.0 06/06/2023 1109   ALKPHOS 103 06/06/2023 1109   AST 14 (L) 06/06/2023 1109   ALT 11 06/06/2023 1109   BILITOT 1.0 06/06/2023 1109  RADIOGRAPHIC STUDIES: CT Chest Wo Contrast Result Date: 06/08/2023 CLINICAL DATA:  Restaging left upper lobe neuroendocrine tumor. History of left upper lobe lobectomy. EXAM: CT CHEST WITHOUT CONTRAST TECHNIQUE: Multidetector CT imaging of the chest was performed following the standard protocol without IV contrast. RADIATION DOSE REDUCTION: This exam was performed according to the departmental dose-optimization program which includes automated exposure control, adjustment of the mA and/or kV according to patient size and/or use of iterative reconstruction technique. COMPARISON:  Multiple prior imaging studies. The most recent chest CT was 06/05/2022. Prior PET-CT 08/31/2021 FINDINGS: Cardiovascular: The heart is normal in size. No pericardial effusion. Stable mild tortuosity of the thoracic aorta with scattered calcifications near the aortic valve. Mild fusiform aneurysmal dilatation of the ascending thoracic aorta which measures a maximum of 4.5 cm. This is unchanged. Ascending thoracic aortic aneurysm. Recommend semi-annual imaging followup by CTA or MRA and referral to  cardiothoracic surgery if not already obtained. This recommendation follows 2010 ACCF/AHA/AATS/ACR/ASA/SCA/SCAI/SIR/STS/SVM Guidelines for the Diagnosis and Management of Patients With Thoracic Aortic Disease. Circulation. 2010; 121: O130-Q657. Aortic aneurysm NOS (ICD10-I71.9) Mediastinum/Nodes: No mediastinal or hilar mass or lymphadenopathy. The esophagus is unremarkable. Lungs/Pleura: Stable surgical changes from a left upper lobe lobectomy. Stable elevation of the left hemidiaphragm with some overlying vascular crowding and scarring/atelectasis. There is also stable dense calcification involving the left basilar pleura. No new pulmonary lesions or pulmonary nodules. The central tracheobronchial tree is unremarkable. Stable areas of subpleural atelectasis adjacent to large right-sided marginal thoracic osteophytes. Upper Abdomen: No significant upper abdominal findings. The gallbladder is contracted and thick walled. Stable small segment 5 hepatic cysts. No adrenal gland lesions. No upper abdominal adenopathy. Musculoskeletal: No significant bony findings. IMPRESSION: 1. Stable surgical changes from a left upper lobe lobectomy. 2. No findings for recurrent or metastatic disease. 3. Stable elevation of the left hemidiaphragm with some overlying vascular crowding and scarring/atelectasis. 4. Stable dense calcification involving the left basilar pleura. 5. Stable mild fusiform aneurysmal dilatation of the ascending thoracic aorta which measures a maximum of 4.5 cm. Recommend semi-annual imaging followup by CTA or MRA and referral to cardiothoracic surgery if not already obtained. 6. Aortic atherosclerosis. Electronically Signed   By: Rudie Meyer M.D.   On: 06/08/2023 15:14    ASSESSMENT AND PLAN: This is a very pleasant 73 years old African-American male with a stage Ib (T2a, N0, M0) typical carcinoid presented with left upper lobe lung mass status post left upper lobectomy with lymph node sampling under the  care of Dr. Cliffton Asters on October 23, 2021. The patient is currently on observation and he is feeling fine with no concerning complaints. He had repeat CT scan of the chest performed recently.  I personally independently reviewed the scan and discussed the result with the patient and his wife.  His scan showed no concerning findings for disease recurrence or metastasis.    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 Reports sensation at the surgical site due to scar tissue. Recent chest scan shows no recurrent or metastatic disease. - Continue annual chest scans - Reassure regarding scar tissue sensation  Thoracic aortic aneurysm Thoracic aortic aneurysm measures 4.5 cm, below the 5 cm threshold for surgical intervention. Requires cardiology follow-up due to previous cardiologist's departure. Surgical consultation needed if it reaches 5 cm to prevent life-threatening rupture. - Arrange follow-up with a new cardiologist for aneurysm monitoring  Atrial fibrillation Atrial fibrillation requires cardiology consultation  for management, as it is outside the hematology and oncology specialty. - Consult with cardiologist for atrial fibrillation management  Follow-up Oncology follow-up is satisfactory with annual monitoring planned. - Schedule follow-up in one year   The patient was advised to call immediately if he has any other concerning symptoms in the interval. The patient voices understanding of current disease status and treatment options and is in agreement with the current care plan.  All questions were answered. The patient knows to call the clinic with any problems, questions or concerns. We can certainly see the patient much sooner if necessary. The total time spent in the appointment was 20 minutes.  Disclaimer: This note was dictated with voice recognition software. Similar sounding words can inadvertently  be transcribed and may not be corrected upon review.

## 2023-06-23 ENCOUNTER — Other Ambulatory Visit: Payer: Self-pay | Admitting: Internal Medicine

## 2023-07-22 DIAGNOSIS — E782 Mixed hyperlipidemia: Secondary | ICD-10-CM | POA: Diagnosis not present

## 2023-07-22 DIAGNOSIS — E118 Type 2 diabetes mellitus with unspecified complications: Secondary | ICD-10-CM | POA: Diagnosis not present

## 2023-07-22 DIAGNOSIS — E1122 Type 2 diabetes mellitus with diabetic chronic kidney disease: Secondary | ICD-10-CM | POA: Diagnosis not present

## 2023-07-22 DIAGNOSIS — E559 Vitamin D deficiency, unspecified: Secondary | ICD-10-CM | POA: Diagnosis not present

## 2023-07-23 LAB — LAB REPORT - SCANNED
A1c: 7.8
Albumin, Urine POC: 17.2
Creatinine, POC: 95.8 mg/dL
EGFR: 61
Microalb Creat Ratio: 18

## 2023-07-30 DIAGNOSIS — E782 Mixed hyperlipidemia: Secondary | ICD-10-CM | POA: Diagnosis not present

## 2023-07-30 DIAGNOSIS — N1831 Chronic kidney disease, stage 3a: Secondary | ICD-10-CM | POA: Diagnosis not present

## 2023-07-30 DIAGNOSIS — Z8673 Personal history of transient ischemic attack (TIA), and cerebral infarction without residual deficits: Secondary | ICD-10-CM | POA: Diagnosis not present

## 2023-07-30 DIAGNOSIS — I712 Thoracic aortic aneurysm, without rupture, unspecified: Secondary | ICD-10-CM | POA: Diagnosis not present

## 2023-07-30 DIAGNOSIS — I4892 Unspecified atrial flutter: Secondary | ICD-10-CM | POA: Diagnosis not present

## 2023-07-30 DIAGNOSIS — R600 Localized edema: Secondary | ICD-10-CM | POA: Diagnosis not present

## 2023-07-30 DIAGNOSIS — I10 Essential (primary) hypertension: Secondary | ICD-10-CM | POA: Diagnosis not present

## 2023-07-30 DIAGNOSIS — C7A090 Malignant carcinoid tumor of the bronchus and lung: Secondary | ICD-10-CM | POA: Diagnosis not present

## 2023-07-30 DIAGNOSIS — I129 Hypertensive chronic kidney disease with stage 1 through stage 4 chronic kidney disease, or unspecified chronic kidney disease: Secondary | ICD-10-CM | POA: Diagnosis not present

## 2023-08-12 ENCOUNTER — Other Ambulatory Visit: Payer: Self-pay | Admitting: Internal Medicine

## 2023-08-13 ENCOUNTER — Ambulatory Visit: Admitting: Nurse Practitioner

## 2023-08-29 ENCOUNTER — Telehealth: Payer: Self-pay

## 2023-08-29 NOTE — Progress Notes (Signed)
   08/29/2023  Patient ID: Chris Lucas, male   DOB: April 25, 1950, 73 y.o.   MRN: [id number]  Adherence Monitoring:  Patient up to date on meds, documentation in [employer].   Flint Hummer, PharmD

## 2023-10-07 ENCOUNTER — Telehealth: Payer: Self-pay

## 2023-10-07 NOTE — Telephone Encounter (Signed)
 Up to date on meds, next review in August

## 2023-10-09 ENCOUNTER — Encounter: Payer: Self-pay | Admitting: Nurse Practitioner

## 2023-10-09 ENCOUNTER — Ambulatory Visit: Attending: Cardiology | Admitting: Nurse Practitioner

## 2023-10-09 VITALS — BP 148/90 | HR 77 | Ht 73.0 in | Wt 230.2 lb

## 2023-10-09 DIAGNOSIS — E119 Type 2 diabetes mellitus without complications: Secondary | ICD-10-CM

## 2023-10-09 DIAGNOSIS — Z8673 Personal history of transient ischemic attack (TIA), and cerebral infarction without residual deficits: Secondary | ICD-10-CM

## 2023-10-09 DIAGNOSIS — E785 Hyperlipidemia, unspecified: Secondary | ICD-10-CM

## 2023-10-09 DIAGNOSIS — R0602 Shortness of breath: Secondary | ICD-10-CM | POA: Diagnosis not present

## 2023-10-09 DIAGNOSIS — I7121 Aneurysm of the ascending aorta, without rupture: Secondary | ICD-10-CM

## 2023-10-09 DIAGNOSIS — I700 Atherosclerosis of aorta: Secondary | ICD-10-CM | POA: Diagnosis not present

## 2023-10-09 DIAGNOSIS — I10 Essential (primary) hypertension: Secondary | ICD-10-CM | POA: Diagnosis not present

## 2023-10-09 DIAGNOSIS — I483 Typical atrial flutter: Secondary | ICD-10-CM

## 2023-10-09 NOTE — Patient Instructions (Signed)
 Medication Instructions:  Your physician recommends that you continue on your current medications as directed. Please refer to the Current Medication list given to you today.  *If you need a refill on your cardiac medications before your next appointment, please call your pharmacy*  Lab Work: CBC, CMET, TSH, MAGNESIUM TODAY LAB IS ON THE FIRST FLOOR  If you have labs (blood work) drawn today and your tests are completely normal, you will receive your results only by: MyChart Message (if you have MyChart) OR A paper copy in the mail If you have any lab test that is abnormal or we need to change your treatment, we will call you to review the results.  Testing/Procedures: Your physician has requested that you have an echocardiogram. Echocardiography is a painless test that uses sound waves to create images of your heart. It provides your doctor with information about the size and shape of your heart and how well your heart's chambers and valves are working. This procedure takes approximately one hour. There are no restrictions for this procedure. Please do NOT wear cologne, perfume, aftershave, or lotions (deodorant is allowed). Please arrive 15 minutes prior to your appointment time.  Please note: We ask at that you not bring children with you during ultrasound (echo/ vascular) testing. Due to room size and safety concerns, children are not allowed in the ultrasound rooms during exams. Our front office staff cannot provide observation of children in our lobby area while testing is being conducted. An adult accompanying a patient to their appointment will only be allowed in the ultrasound room at the discretion of the ultrasound technician under special circumstances. We apologize for any inconvenience.   Follow-Up: At [facility], you and your health needs are our priority.  As part of our continuing mission to provide you with exceptional heart care, our providers are all part of one  team.  This team includes your primary Cardiologist (physician) and Advanced Practice Providers or APPs (Physician Assistants and Nurse Practitioners) who all work together to provide you with the care you need, when you need it.  Your next appointment:   3-4 month(s)  Provider:   DR. DARRYLE DECENT, MD   We recommend signing up for the patient portal called MyChart.  Sign up information is provided on this After Visit Summary.  MyChart is used to connect with patients for Virtual Visits (Telemedicine).  Patients are able to view lab/test results, encounter notes, upcoming appointments, etc.  Non-urgent messages can be sent to your provider as well.   To learn more about what you can do with MyChart, go to [email or web address].   Other Instructions MONITOR BLOOD PRESSURE, GOAL IS 130/80

## 2023-10-09 NOTE — Progress Notes (Signed)
 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   Pneumonia    Pre-diabetes    diet controlled   Stroke (HCC) 07/06/2021   Thoracic ascending aortic aneurysm [facility])    Past Surgical History:  Procedure Laterality Date   BRONCHIAL BIOPSY  07/24/2021   Procedure: BRONCHIAL BIOPSIES;  Surgeon: Shelah Lamar RAMAN, MD;  Location: [facility] ENDOSCOPY;  Service: Pulmonary;;   BRONCHIAL BRUSHINGS  07/24/2021   Procedure: BRONCHIAL BRUSHINGS;  Surgeon: Shelah Lamar RAMAN, MD;  Location: [facility] ENDOSCOPY;  Service: Pulmonary;;   BRONCHIAL NEEDLE ASPIRATION BIOPSY  07/24/2021   Procedure: BRONCHIAL NEEDLE ASPIRATION BIOPSIES;  Surgeon: Shelah Lamar RAMAN, MD;  Location: [facility] ENDOSCOPY;  Service: Pulmonary;;   CARDIAC CATHETERIZATION     Years ago  (apporx 18-20 yrs)   CARDIOVERSION N/A 09/01/2021   Procedure: CARDIOVERSION;  Surgeon: Raford Riggs, MD;  Location: [facility] ENDOSCOPY;  Service: Cardiovascular;  Laterality: N/A;   colonoscopy     INTERCOSTAL NERVE BLOCK  10/23/2021   Procedure: INTERCOSTAL NERVE BLOCK;  Surgeon: Shyrl Linnie KIDD, MD;  Location: [facility] OR;  Service: Thoracic;;   NODE DISSECTION  10/23/2021   Procedure: NODE DISSECTION;  Surgeon: Shyrl Linnie KIDD, MD;  Location: [facility]  OR;  Service: Thoracic;;   VIDEO BRONCHOSCOPY WITH RADIAL ENDOBRONCHIAL ULTRASOUND  07/24/2021   Procedure: VIDEO BRONCHOSCOPY WITH RADIAL ENDOBRONCHIAL ULTRASOUND;  Surgeon: Shelah Lamar RAMAN, MD;  Location: [facility] ENDOSCOPY;  Service: Pulmonary;;    Allergies  Allergies  Allergen Reactions   Ivp Dye [Iodinated Contrast Media] Shortness Of Breath and Rash     Labs/Other Studies Reviewed    The following studies were reviewed today:  Cardiac Studies & Procedures   ______________________________________________________________________________________________   STRESS TESTS  MYOCARDIAL PERFUSION IMAGING 08/29/2021  Narrative   The study is normal. The study is low risk.   No ST deviation was noted.   LV perfusion is normal.   Left ventricular function is normal. Nuclear stress EF: 63 %. The left ventricular ejection fraction is normal (55-65%). End diastolic cavity size is normal.   Prior study available for comparison from 06/23/2004. No changes compared to prior study.  Low risk stress nuclear study with normal perfusion and normal left ventricular regional and global systolic function.   ECHOCARDIOGRAM  ECHOCARDIOGRAM COMPLETE 07/07/2021  Narrative ECHOCARDIOGRAM REPORT    Patient Name:   Chris Lucas Date of Exam: 07/07/2021 Medical Rec #:  [id number]         Height:       73.0 in Accession #:    [id number]        Weight:       263.0 lb Date of Birth:  [birth year]        BSA:  2.417 m Patient Age:    67 years          BP:           153/99 mmHg Patient Gender: M                 HR:           72 bpm. Exam Location:  [facility]  Procedure: 2D Echo, Cardiac Doppler and Color Doppler  Indications:    Atrial Flutter  History:        Patient has no prior history of Echocardiogram examinations. Signs/Symptoms:Edema and Syncope; Risk Factors:Hypertension and Dyslipidemia.  Sonographer:    Naomie Reef Referring Phys: 62 DAWOOD S  ELGERGAWY  IMPRESSIONS   1. Left ventricular ejection fraction, by estimation, is 55 to 60%. The left ventricle has normal function. The left ventricle has no regional wall motion abnormalities. There is moderate left ventricular hypertrophy. Left ventricular diastolic parameters are indeterminate. 2. Right ventricular systolic function is normal. The right ventricular size is normal. 3. Left atrial size was moderately dilated. 4. The mitral valve is normal in structure. No evidence of mitral valve regurgitation. No evidence of mitral stenosis. 5. The aortic valve is tricuspid. There is mild calcification of the aortic valve. There is mild thickening of the aortic valve. Aortic valve regurgitation is not visualized. No aortic stenosis is present. 6. Aortic dilatation noted. There is mild dilatation of the ascending aorta, measuring 39 mm. 7. The inferior vena cava is normal in size with greater than 50% respiratory variability, suggesting right atrial pressure of 3 mmHg.  FINDINGS Left Ventricle: Left ventricular ejection fraction, by estimation, is 55 to 60%. The left ventricle has normal function. The left ventricle has no regional wall motion abnormalities. The left ventricular internal cavity size was normal in size. There is moderate left ventricular hypertrophy. Left ventricular diastolic parameters are indeterminate.  Right Ventricle: The right ventricular size is normal. No increase in right ventricular wall thickness. Right ventricular systolic function is normal.  Left Atrium: Left atrial size was moderately dilated.  Right Atrium: Right atrial size was normal in size.  Pericardium: There is no evidence of pericardial effusion.  Mitral Valve: The mitral valve is normal in structure. No evidence of mitral valve regurgitation. No evidence of mitral valve stenosis. MV peak gradient, 4.6 mmHg. The mean mitral valve gradient is 2.0 mmHg.  Tricuspid Valve: The tricuspid valve is  normal in structure. Tricuspid valve regurgitation is not demonstrated. No evidence of tricuspid stenosis.  Aortic Valve: The aortic valve is tricuspid. There is mild calcification of the aortic valve. There is mild thickening of the aortic valve. There is mild aortic valve annular calcification. Aortic valve regurgitation is not visualized. No aortic stenosis is present. Aortic valve mean gradient measures 2.0 mmHg. Aortic valve peak gradient measures 3.3 mmHg. Aortic valve area, by VTI measures 2.55 cm.  Pulmonic Valve: The pulmonic valve was not well visualized. Pulmonic valve regurgitation is not visualized. No evidence of pulmonic stenosis.  Aorta: The aortic root is normal in size and structure and aortic dilatation noted. There is mild dilatation of the ascending aorta, measuring 39 mm.  Venous: The inferior vena cava is normal in size with greater than 50% respiratory variability, suggesting right atrial pressure of 3 mmHg.  IAS/Shunts: No atrial level shunt detected by color flow Doppler.   LEFT VENTRICLE PLAX 2D LVIDd:         4.80 cm   Diastology LVIDs:  3.40 cm   LV e' medial:    6.09 cm/s LV PW:         1.30 cm   LV E/e' medial:  16.6 LV IVS:        1.50 cm   LV e' lateral:   9.46 cm/s LVOT diam:     2.00 cm   LV E/e' lateral: 10.7 LV SV:         49 LV SV Index:   20 LVOT Area:     3.14 cm   RIGHT VENTRICLE RV Basal diam:  3.90 cm  LEFT ATRIUM              Index        RIGHT ATRIUM           Index LA diam:        4.30 cm  1.78 cm/m   RA Area:     19.00 cm LA Vol (A2C):   86.0 ml  35.58 ml/m  RA Volume:   50.20 ml  20.77 ml/m LA Vol (A4C):   112.0 ml 46.33 ml/m LA Biplane Vol: 103.0 ml 42.61 ml/m AORTIC VALVE                    PULMONIC VALVE AV Area (Vmax):    2.35 cm     PV Vmax:       0.63 m/s AV Area (Vmean):   2.29 cm     PV Peak grad:  1.6 mmHg AV Area (VTI):     2.55 cm AV Vmax:           91.00 cm/s AV Vmean:          66.400 cm/s AV VTI:             0.192 m AV Peak Grad:      3.3 mmHg AV Mean Grad:      2.0 mmHg LVOT Vmax:         68.20 cm/s LVOT Vmean:        48.450 cm/s LVOT VTI:          0.157 m LVOT/AV VTI ratio: 0.81  AORTA Ao Root diam: 3.80 cm Ao Asc diam:  3.90 cm  MITRAL VALVE MV Area (PHT): 4.39 cm     SHUNTS MV Area VTI:   2.12 cm     Systemic VTI:  0.16 m MV Peak grad:  4.6 mmHg     Systemic Diam: 2.00 cm MV Mean grad:  2.0 mmHg MV Vmax:       1.07 m/s MV Vmean:      55.0 cm/s MV Decel Time: 173 msec MV E velocity: 101.00 cm/s  Dorn Ross MD Electronically signed by Dorn Ross MD Signature Date/Time: 07/07/2021/1:02:34 PM    Final          ______________________________________________________________________________________________     Recent Labs: 06/06/2023: ALT 11; BUN 23; Creatinine 1.64; Hemoglobin 13.7; Platelet Count 159; Potassium 4.1; Sodium 141  Recent Lipid Panel    Component Value Date/Time   CHOL 157 07/07/2021 0424   TRIG 109 07/07/2021 0424   HDL 41 07/07/2021 0424   CHOLHDL 3.8 07/07/2021 0424   VLDL 22 07/07/2021 0424   LDLCALC 94 07/07/2021 0424    History of Present Illness    73 year old male with the above past medical history including paroxysmal atrial flutter, thoracic aortic aneurysm, aortic atherosclerosis, hypertension, hyperlipidemia, type 2 diabetes, right cerebellar stroke, and stage Ib carcinoid tumor of the  lung s/p left upper lobectomy.  He was hospitalized in 06/2021 in the setting of right cerebellar infarct.  He was noted to have atrial flutter on telemetry monitoring.  Cardiology was consulted.  He was started on Eliquis .  Echocardiogram in 06/2021 showed EF 55 to 60%, normal LV function, no RWMA, moderate LVH, normal RV systolic function, no significant valvular abnormalities, mild dilation of ascending aorta measuring 39 mm.  He was also noted to have a lung mass, now s/p left upper lobectomy in 10/2021 in the setting of stage Ib carcinoid  tumor, followed by oncology.  He has a history of thoracic aortic aneurysm. CT of the chest in 06/2023 revealed 4.5 cm thoracic ascending aortic aneurysm.  He was last seen in the office on 02/21/2023 and was stable from a cardiac standpoint.  He was maintaining sinus rhythm.   He presents today for follow-up accompanied by his wife. Since his last visit he been stable overall from a cardiac standpoint. He does reports stable mild shortness of breath with activity, mildly decreased activity tolerance.  He has stable mild nonpitting bilateral lower extremity edema. He denies chest pain, palpitations, dizziness, PND, orthopnea, weight gain.  EKG today shows rate controlled atrial flutter, he is overall cardiac unaware.  Home Medications    Current Outpatient Medications  Medication Sig Dispense Refill   albuterol  (VENTOLIN  HFA) 108 (90 Base) MCG/ACT inhaler Inhale 2 puffs into the lungs every 6 (six) hours as needed for wheezing or shortness of breath. 8 g 6   apixaban  (ELIQUIS ) 5 MG TABS tablet Take 5 mg by mouth 2 (two) times daily.     apixaban  (ELIQUIS ) 5 MG TABS tablet Take 1 tablet (5 mg total) by mouth 2 (two) times daily. 28 tablet 0   atorvastatin  (LIPITOR) 40 MG tablet Take 40 mg by mouth daily.     carvedilol  (COREG ) 25 MG tablet TAKE 1 TABLET BY MOUTH TWICE A DAY 180 tablet 3   cloNIDine  (CATAPRES ) 0.1 MG tablet TAKE 1 TABLET BY MOUTH TWICE DAILY**MAY TAKE 1 EXTRA TABLET DAILY FOR SYSTOLIC PRESSURE >150 180 tablet 2   dapagliflozin  propanediol (FARXIGA ) 10 MG TABS tablet Take 1 tablet (10 mg total) by mouth daily. 30 tablet 1   furosemide  (LASIX ) 40 MG tablet Take 1 tablet (40 mg total) by mouth daily. 30 tablet 1   irbesartan  (AVAPRO ) 300 MG tablet TAKE 1 TABLET BY MOUTH EVERY DAY 90 tablet 3   metFORMIN (GLUCOPHAGE-XR) 500 MG 24 hr tablet Take 500 mg by mouth daily.     Vitamin D, Ergocalciferol, (DRISDOL) 1.25 MG (50000 UNIT) CAPS capsule Take 50,000 Units by mouth every Sunday.      loratadine  (CLARITIN ) 10 MG tablet Take 10 mg by mouth daily as needed for allergies.     ondansetron  (ZOFRAN -ODT) 4 MG disintegrating tablet Take 1 tablet (4 mg total) by mouth every 8 (eight) hours as needed for nausea or vomiting. (Patient not taking: Reported on 10/09/2023) 20 tablet 0   No current facility-administered medications for this visit.     Review of Systems    He denies chest pain, palpitations, dyspnea, pnd, orthopnea, n, v, dizziness, syncope, edema, weight gain, or early satiety. All other systems reviewed and are otherwise negative except as noted above.   Physical Exam    VS:  BP (!) 148/90 (BP Location: Left Arm, Patient Position: Sitting)   Ht 6' 1 (1.854 m)   Wt 230 lb 3.2 oz (104.4 kg)   BMI 30.37 kg/m  GEN: Well nourished, well developed, in no acute distress. HEENT: normal. Neck: Supple, no JVD, carotid bruits, or masses. Cardiac: RRR, no murmurs, rubs, or gallops. No clubbing, cyanosis, edema.  Radials/DP/PT 2+ and equal bilaterally.  Respiratory:  Respirations regular and unlabored, clear to auscultation bilaterally. GI: Soft, nontender, nondistended, BS + x 4. MS: no deformity or atrophy. Skin: warm and dry, no rash. Neuro:  Strength and sensation are intact. Psych: Normal affect.  Accessory Clinical Findings    ECG personally reviewed by me today - EKG Interpretation Date/Time:  Wednesday October 09 2023 11:44:36 EDT Ventricular Rate:  77 PR Interval:    QRS Duration:  88 QT Interval:  426 QTC Calculation: 482 R Axis:   130  Text Interpretation: Atrial flutter with 4:1 A-V conduction Right axis deviation ST & T wave abnormality, consider inferior ischemia ST & T wave abnormality, consider anterolateral ischemia Prolonged QT When compared with ECG of 21-Feb-2023 08:46, Atrial flutter has replaced Sinus rhythm QRS axis Shifted right Confirmed by Daneen Perkins ([id number]) on 10/09/2023 11:47:34 AM  - no acute changes.   Lab Results  Component Value Date    WBC 4.1 06/06/2023   HGB 13.7 06/06/2023   HCT 41.6 06/06/2023   MCV 95.2 06/06/2023   PLT 159 06/06/2023   Lab Results  Component Value Date   CREATININE 1.64 (H) 06/06/2023   BUN 23 06/06/2023   NA 141 06/06/2023   K 4.1 06/06/2023   CL 105 06/06/2023   CO2 34 (H) 06/06/2023   Lab Results  Component Value Date   ALT 11 06/06/2023   AST 14 (L) 06/06/2023   ALKPHOS 103 06/06/2023   BILITOT 1.0 06/06/2023   Lab Results  Component Value Date   CHOL 157 07/07/2021   HDL 41 07/07/2021   LDLCALC 94 07/07/2021   TRIG 109 07/07/2021   CHOLHDL 3.8 07/07/2021    Lab Results  Component Value Date   HGBA1C 7.6 (H) 10/19/2021    Assessment & Plan    1. Paroxysmal atrial flutter/shortness of breath: Echocardiogram in 06/2021 showed EF 55 to 60%, normal LV function, no RWMA, moderate LVH, normal RV systolic function, no significant valvular abnormalities, mild dilation of ascending aorta measuring 39 mm. EKG today shows rate controlled atrial flutter.  He has stable mild shortness of breath with activity, mildly decreased activity tolerance, stable nonpitting mild lateral lower extremity edema.  He was unaware that he was back in atrial flutter.  Generally euvolemic and well compensated on exam.  He reports having missed a dose of his Eliquis  last week.  Will check CMET, TSH, magnesium, CBC today.  He may not be an ideal candidate for amiodarone therapy given pulmonary history.  Will refer to A-fib clinic for ongoing management.  Will update echocardiogram in the setting of shortness of breath with activity.  Encouraged adherence to Eliquis , reviewed ED precautions.  Continue carvedilol , Eliquis , Lasix .  2. Thoracic aortic aneurysm: CT of the chest in 06/2023 showed 4.5 cm thoracic ascending aortic aneurysm.  He does have a contrast allergy.  Repeat echo pending as above, however, he will likely require MRA chest without contrast in the future for monitoring.  Can arrange at follow-up.  We  reviewed lifting restrictions.  3. Aortic atherosclerosis: Noted on prior CT. Stable with no anginal symptoms. No indication for ischemic evaluation.  Continue Lipitor.  4. Hypertension: BP mildly elevated in office today, has been well-controlled at home. Continue to monitor BP and report BP consistently greater than  130/80 mmHg.  For now, continue current antihypertensive regimen.  5. Hyperlipidemia: LDL was 33 in 07/2023.  Continue Lipitor.  6. Type 2 diabetes: A1c was 7.8 in 07/2023.  Monitored and managed per PCP.  7. History of stroke: No significant residual, no recurrence.  Continue Eliquis , Lipitor.  8. Stage Ib carcinoid tumor: S/p left upper lobectomy in 10/2021. Followed by oncology.   9. Disposition: Follow-up in 3 to 4 months, he is a former patient of Dr. Alvan, he would like to establish with Dr. Barbaraann.     Damien JAYSON Braver, NP 10/09/2023, 11:49 AM

## 2023-10-10 LAB — BASIC METABOLIC PANEL WITH GFR
BUN/Creatinine Ratio: 12 (ref 10–24)
BUN: 18 mg/dL (ref 8–27)
CO2: 26 mmol/L (ref 20–29)
Calcium: 9.5 mg/dL (ref 8.6–10.2)
Chloride: 101 mmol/L (ref 96–106)
Creatinine, Ser: 1.53 mg/dL — ABNORMAL HIGH (ref 0.76–1.27)
Glucose: 130 mg/dL — ABNORMAL HIGH (ref 70–99)
Potassium: 4.1 mmol/L (ref 3.5–5.2)
Sodium: 143 mmol/L (ref 134–144)
eGFR: 48 mL/min/{1.73_m2} — ABNORMAL LOW (ref >59)

## 2023-10-13 ENCOUNTER — Encounter: Payer: Self-pay | Admitting: Nurse Practitioner

## 2023-10-14 ENCOUNTER — Ambulatory Visit: Payer: Self-pay | Admitting: Nurse Practitioner

## 2023-10-18 ENCOUNTER — Encounter (HOSPITAL_COMMUNITY): Payer: Self-pay

## 2023-10-18 ENCOUNTER — Ambulatory Visit (HOSPITAL_COMMUNITY): Admitting: Internal Medicine

## 2023-10-18 NOTE — Progress Notes (Incomplete)
 Primary Care Physician: Shona Norleen PEDLAR, MD Primary Cardiologist: Alvan Ronal BRAVO, MD (Inactive) Electrophysiologist: None  {Click to update primary MD,subspecialty MD or APP then REFRESH:1}   Referring Physician: Damien Braver, NP  {Removed [facility], PMH, PSH, ALLERGY, CMED, and SOC :1}   Chris Lucas is a 73 y.o. 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 Seen by Cardiology on 10/09/23 and note to be in atrial flutter; patient did not have cardiac awareness. He had missed a dose of Eliquis  the week prior to cardiology visit. Patient is on Eliquis  5 mg BID for a CHADS2VASC score of 6.  On evaluation today, he is currently in ***.   Today, he denies symptoms of ***palpitations, chest pain, shortness of breath, orthopnea, PND, lower extremity edema, dizziness, presyncope, syncope, snoring, daytime somnolence, bleeding, or neurologic sequela. The patient is tolerating medications without difficulties and is otherwise without complaint today.    Atrial Fibrillation Risk Factors:  he {Action; does/does not:19097} have symptoms or diagnosis of sleep apnea. he {ACTION; IS/IS [id number]} compliant with CPAP therapy. he {Action; does/does not:19097} have a history of rheumatic fever. he {Action; does/does not:19097} have a history of alcohol use. The patient {Action; does/does not:19097} have a history of early familial atrial fibrillation or other arrhythmias.  he has a BMI of There is no height or weight on file to calculate BMI.. There were no vitals filed for this visit.  Current Outpatient Medications  Medication Sig Dispense Refill   albuterol  (VENTOLIN  HFA) 108 (90 Base) MCG/ACT inhaler Inhale 2 puffs into the lungs every 6 (six) hours as needed for wheezing or shortness of breath. 8 g  6   apixaban  (ELIQUIS ) 5 MG TABS tablet Take 5 mg by mouth 2 (two) times daily.     apixaban  (ELIQUIS ) 5 MG TABS tablet Take 1 tablet (5 mg total) by mouth 2 (two) times daily. 28 tablet 0   atorvastatin  (LIPITOR) 40 MG tablet Take 40 mg by mouth daily.     carvedilol  (COREG ) 25 MG tablet TAKE 1 TABLET BY MOUTH TWICE A DAY 180 tablet 3   cloNIDine  (CATAPRES ) 0.1 MG tablet TAKE 1 TABLET BY MOUTH TWICE DAILY**MAY TAKE 1 EXTRA TABLET DAILY FOR SYSTOLIC PRESSURE >150 180 tablet 2   dapagliflozin  propanediol (FARXIGA ) 10 MG TABS tablet Take 1 tablet (10 mg total) by mouth daily. 30 tablet 1   furosemide  (LASIX ) 40 MG tablet Take 1 tablet (40 mg total) by mouth daily. 30 tablet 1   irbesartan  (AVAPRO ) 300 MG tablet TAKE 1 TABLET BY MOUTH EVERY DAY 90 tablet 3   loratadine  (CLARITIN ) 10 MG tablet Take 10 mg by mouth daily as needed for allergies.     metFORMIN (GLUCOPHAGE-XR) 500 MG 24 hr tablet Take 500 mg by mouth daily.     ondansetron  (ZOFRAN -ODT) 4 MG disintegrating tablet Take 1 tablet (4 mg total) by mouth every 8 (eight) hours as needed for nausea or vomiting. (Patient not taking: Reported on 10/09/2023) 20 tablet 0   Vitamin D, Ergocalciferol, (DRISDOL) 1.25 MG (50000 UNIT) CAPS capsule Take 50,000 Units by mouth every Sunday.     No current facility-administered medications for this visit.    Atrial Fibrillation Management history:  Previous antiarrhythmic drugs: none Previous cardioversions: 09/01/21 Previous ablations: none Anticoagulation history: Eliquis    ROS- All systems  are reviewed and negative except as per the HPI above.  Physical Exam: There were no vitals taken for this visit.  GEN: Well nourished, well developed in no acute distress NECK: No JVD; No carotid bruits CARDIAC: {EPRHYTHM:[id number]}, no murmurs, rubs, gallops RESPIRATORY:  Clear to auscultation without rales, wheezing or rhonchi  ABDOMEN: Soft, non-tender, non-distended EXTREMITIES:  No edema; No deformity    EKG today demonstrates ***  Echo 07/07/21 demonstrated  1. Left ventricular ejection fraction, by estimation, is 55 to 60%. The  left ventricle has normal function. The left ventricle has no regional  wall motion abnormalities. There is moderate left ventricular hypertrophy.  Left ventricular diastolic  parameters are indeterminate.   2. Right ventricular systolic function is normal. The right ventricular  size is normal.   3. Left atrial size was moderately dilated.   4. The mitral valve is normal in structure. No evidence of mitral valve  regurgitation. No evidence of mitral stenosis.   5. The aortic valve is tricuspid. There is mild calcification of the  aortic valve. There is mild thickening of the aortic valve. Aortic valve  regurgitation is not visualized. No aortic stenosis is present.   6. Aortic dilatation noted. There is mild dilatation of the ascending  aorta, measuring 39 mm.   7. The inferior vena cava is normal in size with greater than 50%  respiratory variability, suggesting right atrial pressure of 3 mmHg.   ASSESSMENT & PLAN CHA2DS2-VASc Score =    The patient's score is based upon:    {Confirm score is correct.  If not, click here to update score.  REFRESH note.  :1}   Chads 6 with HTN, T2DM, CAD, CVA  ASSESSMENT AND PLAN: {Select the correct AFib Diagnosis                 :[id number]}  P flutter  Follow up ***   Terra Pac, PA-C  [facility] [address] [place], [place] [address] [phone]

## 2023-11-08 ENCOUNTER — Ambulatory Visit (HOSPITAL_COMMUNITY)
Admission: RE | Admit: 2023-11-08 | Discharge: 2023-11-08 | Disposition: A | Discharge status: Home / Self Care | Source: Ambulatory Visit | Attending: Internal Medicine | Admitting: Internal Medicine

## 2023-11-08 VITALS — BP 138/90 | HR 78 | Ht 73.0 in | Wt 227.0 lb

## 2023-11-08 DIAGNOSIS — D6869 Other thrombophilia: Secondary | ICD-10-CM | POA: Diagnosis not present

## 2023-11-08 DIAGNOSIS — I4819 Other persistent atrial fibrillation: Secondary | ICD-10-CM

## 2023-11-08 DIAGNOSIS — I483 Typical atrial flutter: Secondary | ICD-10-CM

## 2023-11-08 DIAGNOSIS — I4891 Unspecified atrial fibrillation: Secondary | ICD-10-CM | POA: Diagnosis not present

## 2023-11-08 NOTE — Progress Notes (Signed)
 Primary Care Physician: Shona Norleen PEDLAR, MD Primary Cardiologist: Alvan Ronal BRAVO, MD (Inactive) Electrophysiologist: None     Referring Physician: Damien Braver, NP     Chris Lucas is a 73 y.o. 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 Seen by Cardiology on 10/09/23 and note to be in atrial flutter; patient did not have cardiac awareness. He had missed a dose of Eliquis  the week prior to cardiology visit. Patient is on Eliquis  5 mg BID for a CHADS2VASC score of 6.  On evaluation today, he is currently in atrial flutter. He missed a dose of Eliquis  last night and has not taken his medications this morning. He notes to have SOB, some fatigue, and some LE edema. 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    Today, he denies symptoms of palpitations, chest pain, orthopnea, PND, lower extremity edema, dizziness, presyncope, syncope, snoring, daytime somnolence, bleeding, or neurologic sequela. The patient is tolerating medications without difficulties and is otherwise without complaint today.    he has a BMI of Body mass index is 29.95 kg/m.SABRA Filed Weights   11/08/23 0954  Weight: 103 kg    Current Outpatient Medications  Medication Sig Dispense Refill   albuterol  (VENTOLIN  HFA) 108 (90 Base) MCG/ACT inhaler Inhale 2 puffs into the lungs every 6 (six) hours as needed for wheezing or shortness of breath. 8 g 6   apixaban  (ELIQUIS ) 5 MG TABS tablet Take 5 mg by mouth 2 (two) times daily.     atorvastatin  (LIPITOR) 40 MG tablet Take 40 mg by mouth daily.     carvedilol  (COREG ) 25 MG tablet TAKE 1 TABLET BY MOUTH TWICE A DAY 180 tablet 3   cloNIDine  (CATAPRES ) 0.1 MG tablet TAKE 1 TABLET BY MOUTH TWICE DAILY**MAY TAKE 1 EXTRA TABLET DAILY  FOR SYSTOLIC PRESSURE >150 180 tablet 2   Dapagliflozin  Pro-metFORMIN ER (XIGDUO XR) 01-999 MG TB24 Take 1 tablet by mouth in the morning.     furosemide  (LASIX ) 40 MG tablet Take 1 tablet (40 mg total) by mouth daily. 30 tablet 1   irbesartan  (AVAPRO ) 300 MG tablet TAKE 1 TABLET BY MOUTH EVERY DAY 90 tablet 3   loratadine  (CLARITIN ) 10 MG tablet Take 10 mg by mouth daily as needed for allergies.     ondansetron  (ZOFRAN -ODT) 4 MG disintegrating tablet Take 1 tablet (4 mg total) by mouth every 8 (eight) hours as needed for nausea or vomiting. (Patient taking differently: Take 4 mg by mouth as needed for nausea or vomiting.) 20 tablet 0   Vitamin D, Ergocalciferol, (DRISDOL) 1.25 MG (50000 UNIT) CAPS capsule Take 50,000 Units by mouth every Sunday.     apixaban  (ELIQUIS ) 5 MG TABS tablet Take 1 tablet (5 mg total) by mouth 2 (two) times daily. 28 tablet 0   dapagliflozin  propanediol (FARXIGA ) 10 MG TABS tablet Take 1 tablet (10 mg total) by mouth daily. (Patient not taking: Reported on 11/08/2023) 30 tablet 1   metFORMIN (GLUCOPHAGE-XR) 500 MG 24 hr tablet Take 500 mg by mouth daily. (Patient not taking: Reported on 11/08/2023)     No current facility-administered medications for this encounter.    Atrial Fibrillation Management history:  Previous antiarrhythmic drugs: none Previous cardioversions: 09/01/21 Previous ablations: none Anticoagulation history: Eliquis   ROS- All systems are reviewed and negative except as per the HPI above.  Physical Exam: BP (!) 138/90   Pulse 78   Ht 6' 1 (1.854 m)   Wt 103 kg   BMI 29.95 kg/m   GEN: Well nourished, well developed in no acute distress NECK: No JVD; No carotid bruits CARDIAC: Regular rate (flutter) with ectopy noted on exam, no murmurs, rubs, gallops RESPIRATORY:  Clear to auscultation without rales, wheezing or rhonchi  ABDOMEN: Soft, non-tender, non-distended EXTREMITIES:  No edema; No deformity   EKG today demonstrates  Vent. rate  78 BPM PR interval * ms QRS duration 84 ms QT/QTcB 430/490 ms P-R-T axes 101 151 268 Atrial flutter with 4:1 A-V conduction Right axis deviation ST & T wave abnormality, consider lateral ischemia Prolonged QT Abnormal ECG When compared with ECG of 09-Oct-2023 11:44, T wave inversion less evident in Anterior leads  Echo 07/07/21 demonstrated  1. Left ventricular ejection fraction, by estimation, is 55 to 60%. The  left ventricle has normal function. The left ventricle has no regional  wall motion abnormalities. There is moderate left ventricular hypertrophy.  Left ventricular diastolic  parameters are indeterminate.   2. Right ventricular systolic function is normal. The right ventricular  size is normal.   3. Left atrial size was moderately dilated.   4. The mitral valve is normal in structure. No evidence of mitral valve  regurgitation. No evidence of mitral stenosis.   5. The aortic valve is tricuspid. There is mild calcification of the  aortic valve. There is mild thickening of the aortic valve. Aortic valve  regurgitation is not visualized. No aortic stenosis is present.   6. Aortic dilatation noted. There is mild dilatation of the ascending  aorta, measuring 39 mm.   7. The inferior vena cava is normal in size with greater than 50%  respiratory variability, suggesting right atrial pressure of 3 mmHg.   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    He is currently in atrial flutter. He has been out of rhythm that we know of for at least a month. Suspect possibly longer. Patient is very hesitant to proceed with cardioversion. We discussed cardioversion and  possible risks vs benefits. We also discussed modification of caffeine intake, alcohol intake, and sleep study for sleep apnea. After discussion, patient does not want to proceed with cardioversion at this time. He will contact us  if wishes to proceed with cardioversion.  Secondary Hypercoagulable State (ICD10:  D68.69) The patient is at significant risk for stroke/thromboembolism based upon his CHA2DS2-VASc Score of 6.  Continue Apixaban  (Eliquis ).  Emphasis and education placed on compliance with OAC to prevent another stroke and for cardioversion.    Patient will call if wishes to pursue cardioversion.   Terra Pac, PA-C  [facility] [address] [place], [place] [address] [phone]

## 2023-11-08 NOTE — H&P (View-Only) (Signed)
 Primary Care Physician: Shona Norleen PEDLAR, MD Primary Cardiologist: Alvan Ronal BRAVO, MD (Inactive) Electrophysiologist: None     Referring Physician: Damien Braver, NP     Chris Lucas is a 73 y.o. 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 Seen by Cardiology on 10/09/23 and note to be in atrial flutter; patient did not have cardiac awareness. He had missed a dose of Eliquis  the week prior to cardiology visit. Patient is on Eliquis  5 mg BID for a CHADS2VASC score of 6.  On evaluation today, he is currently in atrial flutter. He missed a dose of Eliquis  last night and has not taken his medications this morning. He notes to have SOB, some fatigue, and some LE edema. 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    Today, he denies symptoms of palpitations, chest pain, orthopnea, PND, lower extremity edema, dizziness, presyncope, syncope, snoring, daytime somnolence, bleeding, or neurologic sequela. The patient is tolerating medications without difficulties and is otherwise without complaint today.    he has a BMI of Body mass index is 29.95 kg/m.SABRA Filed Weights   11/08/23 0954  Weight: 103 kg    Current Outpatient Medications  Medication Sig Dispense Refill   albuterol  (VENTOLIN  HFA) 108 (90 Base) MCG/ACT inhaler Inhale 2 puffs into the lungs every 6 (six) hours as needed for wheezing or shortness of breath. 8 g 6   apixaban  (ELIQUIS ) 5 MG TABS tablet Take 5 mg by mouth 2 (two) times daily.     atorvastatin  (LIPITOR) 40 MG tablet Take 40 mg by mouth daily.     carvedilol  (COREG ) 25 MG tablet TAKE 1 TABLET BY MOUTH TWICE A DAY 180 tablet 3   cloNIDine  (CATAPRES ) 0.1 MG tablet TAKE 1 TABLET BY MOUTH TWICE DAILY**MAY TAKE 1 EXTRA TABLET DAILY  FOR SYSTOLIC PRESSURE >150 180 tablet 2   Dapagliflozin  Pro-metFORMIN ER (XIGDUO XR) 01-999 MG TB24 Take 1 tablet by mouth in the morning.     furosemide  (LASIX ) 40 MG tablet Take 1 tablet (40 mg total) by mouth daily. 30 tablet 1   irbesartan  (AVAPRO ) 300 MG tablet TAKE 1 TABLET BY MOUTH EVERY DAY 90 tablet 3   loratadine  (CLARITIN ) 10 MG tablet Take 10 mg by mouth daily as needed for allergies.     ondansetron  (ZOFRAN -ODT) 4 MG disintegrating tablet Take 1 tablet (4 mg total) by mouth every 8 (eight) hours as needed for nausea or vomiting. (Patient taking differently: Take 4 mg by mouth as needed for nausea or vomiting.) 20 tablet 0   Vitamin D, Ergocalciferol, (DRISDOL) 1.25 MG (50000 UNIT) CAPS capsule Take 50,000 Units by mouth every Sunday.     apixaban  (ELIQUIS ) 5 MG TABS tablet Take 1 tablet (5 mg total) by mouth 2 (two) times daily. 28 tablet 0   dapagliflozin  propanediol (FARXIGA ) 10 MG TABS tablet Take 1 tablet (10 mg total) by mouth daily. (Patient not taking: Reported on 11/08/2023) 30 tablet 1   metFORMIN (GLUCOPHAGE-XR) 500 MG 24 hr tablet Take 500 mg by mouth daily. (Patient not taking: Reported on 11/08/2023)     No current facility-administered medications for this encounter.    Atrial Fibrillation Management history:  Previous antiarrhythmic drugs: none Previous cardioversions: 09/01/21 Previous ablations: none Anticoagulation history: Eliquis   ROS- All systems are reviewed and negative except as per the HPI above.  Physical Exam: BP (!) 138/90   Pulse 78   Ht 6' 1 (1.854 m)   Wt 103 kg   BMI 29.95 kg/m   GEN: Well nourished, well developed in no acute distress NECK: No JVD; No carotid bruits CARDIAC: Regular rate (flutter) with ectopy noted on exam, no murmurs, rubs, gallops RESPIRATORY:  Clear to auscultation without rales, wheezing or rhonchi  ABDOMEN: Soft, non-tender, non-distended EXTREMITIES:  No edema; No deformity   EKG today demonstrates  Vent. rate  78 BPM PR interval * ms QRS duration 84 ms QT/QTcB 430/490 ms P-R-T axes 101 151 268 Atrial flutter with 4:1 A-V conduction Right axis deviation ST & T wave abnormality, consider lateral ischemia Prolonged QT Abnormal ECG When compared with ECG of 09-Oct-2023 11:44, T wave inversion less evident in Anterior leads  Echo 07/07/21 demonstrated  1. Left ventricular ejection fraction, by estimation, is 55 to 60%. The  left ventricle has normal function. The left ventricle has no regional  wall motion abnormalities. There is moderate left ventricular hypertrophy.  Left ventricular diastolic  parameters are indeterminate.   2. Right ventricular systolic function is normal. The right ventricular  size is normal.   3. Left atrial size was moderately dilated.   4. The mitral valve is normal in structure. No evidence of mitral valve  regurgitation. No evidence of mitral stenosis.   5. The aortic valve is tricuspid. There is mild calcification of the  aortic valve. There is mild thickening of the aortic valve. Aortic valve  regurgitation is not visualized. No aortic stenosis is present.   6. Aortic dilatation noted. There is mild dilatation of the ascending  aorta, measuring 39 mm.   7. The inferior vena cava is normal in size with greater than 50%  respiratory variability, suggesting right atrial pressure of 3 mmHg.   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    He is currently in atrial flutter. He has been out of rhythm that we know of for at least a month. Suspect possibly longer. Patient is very hesitant to proceed with cardioversion. We discussed cardioversion and  possible risks vs benefits. We also discussed modification of caffeine intake, alcohol intake, and sleep study for sleep apnea. After discussion, patient does not want to proceed with cardioversion at this time. He will contact us  if wishes to proceed with cardioversion.  Secondary Hypercoagulable State (ICD10:  D68.69) The patient is at significant risk for stroke/thromboembolism based upon his CHA2DS2-VASc Score of 6.  Continue Apixaban  (Eliquis ).  Emphasis and education placed on compliance with OAC to prevent another stroke and for cardioversion.    Patient will call if wishes to pursue cardioversion.   Terra Pac, PA-C  [facility] [address] [place], [place] [address] [phone]

## 2023-11-21 ENCOUNTER — Other Ambulatory Visit (HOSPITAL_COMMUNITY): Payer: Self-pay | Admitting: *Deleted

## 2023-11-21 ENCOUNTER — Encounter (HOSPITAL_COMMUNITY): Payer: Self-pay | Admitting: *Deleted

## 2023-11-21 ENCOUNTER — Telehealth (HOSPITAL_COMMUNITY): Payer: Self-pay | Admitting: *Deleted

## 2023-11-21 DIAGNOSIS — I4819 Other persistent atrial fibrillation: Secondary | ICD-10-CM

## 2023-11-21 NOTE — Telephone Encounter (Signed)
 Patient called stating he is ready to proceed with cardioversion.     Hold below medications 72 hours prior to scheduled procedure/anesthesia. Restart medication on the following day after scheduled procedure/anesthesia  Dapagliflozin  (Farxiga ) Dapagliflozin /metformin (Xigduo XR)  Cardioversion scheduled for: Friday, August 22nd   - Arrive at the [facility] ([address])  and check in with ADMITTING at 8:00 AM   - Do not eat or drink anything after midnight the night prior to your procedure.   - Take all your morning medication (except diabetic medications) with a sip of water prior to arrival.  - Do NOT miss any doses of your blood thinner - if you should miss a dose or take a dose more than 4 hours late -- please notify our office immediately.  - You will not be able to drive home after your procedure. Please ensure you have a responsible adult to drive you home. You will need someone with you for 24 hours post procedure.     - Expect to be in the procedural area approximately 2 hours.   - If you feel as if you go back into normal rhythm prior to scheduled cardioversion, please notify our office immediately.   If your procedure is canceled in the cardioversion suite you will be charged a cancellation fee.          For those patients who have a scheduled procedure/anesthesia on the same day of the week as their dose, hold the medication on the day of surgery.  They can take their scheduled dose the week before.  **Patients on the above medications scheduled for elective procedures that have not held the medication for the appropriate amount of time are at risk of cancellation or change in the anesthetic plan.

## 2023-11-22 ENCOUNTER — Telehealth (HOSPITAL_COMMUNITY): Payer: Self-pay | Admitting: *Deleted

## 2023-11-22 ENCOUNTER — Ambulatory Visit (HOSPITAL_COMMUNITY)
Admission: RE | Admit: 2023-11-22 | Discharge: 2023-11-22 | Disposition: A | Discharge status: Home / Self Care | Source: Ambulatory Visit | Attending: Nurse Practitioner | Admitting: Nurse Practitioner

## 2023-11-22 DIAGNOSIS — E1122 Type 2 diabetes mellitus with diabetic chronic kidney disease: Secondary | ICD-10-CM | POA: Diagnosis not present

## 2023-11-22 DIAGNOSIS — R0602 Shortness of breath: Secondary | ICD-10-CM | POA: Diagnosis not present

## 2023-11-22 DIAGNOSIS — I4819 Other persistent atrial fibrillation: Secondary | ICD-10-CM | POA: Diagnosis not present

## 2023-11-22 DIAGNOSIS — I483 Typical atrial flutter: Secondary | ICD-10-CM | POA: Diagnosis not present

## 2023-11-22 DIAGNOSIS — E118 Type 2 diabetes mellitus with unspecified complications: Secondary | ICD-10-CM | POA: Diagnosis not present

## 2023-11-22 DIAGNOSIS — E559 Vitamin D deficiency, unspecified: Secondary | ICD-10-CM | POA: Diagnosis not present

## 2023-11-22 DIAGNOSIS — E782 Mixed hyperlipidemia: Secondary | ICD-10-CM | POA: Diagnosis not present

## 2023-11-22 LAB — ECHOCARDIOGRAM COMPLETE
Area-P 1/2: 4.63 cm2
MV M vel: 4.03 m/s
MV Peak grad: 65 mmHg
S' Lateral: 2 cm

## 2023-11-22 NOTE — Telephone Encounter (Signed)
 pt of Chris Lucas's scheduled for cardioversion next Friday - he initially declined dccv then called back yesterday and scheduled - has been out of rhythm over a month. stopped by with 3+ pitting edema of lower extremity states has worsened since seen by Chris Lucas denied orthopnea or weight gain; dyspnea with activity.  Discussed with Daril Kicks PA will increase lasix  to 40mg  BID until day of cardioversion then reduce back to normal dosing.

## 2023-11-23 LAB — BASIC METABOLIC PANEL WITH GFR
BUN/Creatinine Ratio: 11 (ref 10–24)
BUN: 17 mg/dL (ref 8–27)
CO2: 24 mmol/L (ref 20–29)
Calcium: 9.6 mg/dL (ref 8.6–10.2)
Chloride: 99 mmol/L (ref 96–106)
Creatinine, Ser: 1.49 mg/dL — ABNORMAL HIGH (ref 0.76–1.27)
Glucose: 134 mg/dL — ABNORMAL HIGH (ref 70–99)
Potassium: 4.5 mmol/L (ref 3.5–5.2)
Sodium: 142 mmol/L (ref 134–144)
eGFR: 50 mL/min/1.73 — ABNORMAL LOW (ref >59)

## 2023-11-23 LAB — CBC
Hematocrit: 43.4 % (ref 37.5–51.0)
Hemoglobin: 13.7 g/dL (ref 13.0–17.7)
MCH: 30.4 pg (ref 26.6–33.0)
MCHC: 31.6 g/dL (ref 31.5–35.7)
MCV: 96 fL (ref 79–97)
Platelets: 190 x10E3/uL (ref 150–450)
RBC: 4.5 x10E6/uL (ref 4.14–5.80)
RDW: 12.9 % (ref 11.6–15.4)
WBC: 3.8 x10E3/uL (ref 3.4–10.8)

## 2023-11-26 ENCOUNTER — Ambulatory Visit (HOSPITAL_COMMUNITY): Payer: Self-pay | Admitting: Internal Medicine

## 2023-11-26 ENCOUNTER — Other Ambulatory Visit (HOSPITAL_COMMUNITY)

## 2023-11-28 ENCOUNTER — Ambulatory Visit: Payer: Self-pay | Admitting: Nurse Practitioner

## 2023-11-28 DIAGNOSIS — I129 Hypertensive chronic kidney disease with stage 1 through stage 4 chronic kidney disease, or unspecified chronic kidney disease: Secondary | ICD-10-CM | POA: Diagnosis not present

## 2023-11-28 DIAGNOSIS — N1831 Chronic kidney disease, stage 3a: Secondary | ICD-10-CM | POA: Diagnosis not present

## 2023-11-28 DIAGNOSIS — I712 Thoracic aortic aneurysm, without rupture, unspecified: Secondary | ICD-10-CM | POA: Diagnosis not present

## 2023-11-28 DIAGNOSIS — E782 Mixed hyperlipidemia: Secondary | ICD-10-CM | POA: Diagnosis not present

## 2023-11-28 DIAGNOSIS — E1122 Type 2 diabetes mellitus with diabetic chronic kidney disease: Secondary | ICD-10-CM | POA: Diagnosis not present

## 2023-11-28 DIAGNOSIS — C7A090 Malignant carcinoid tumor of the bronchus and lung: Secondary | ICD-10-CM | POA: Diagnosis not present

## 2023-11-28 DIAGNOSIS — I10 Essential (primary) hypertension: Secondary | ICD-10-CM | POA: Diagnosis not present

## 2023-11-28 DIAGNOSIS — R600 Localized edema: Secondary | ICD-10-CM | POA: Diagnosis not present

## 2023-11-28 DIAGNOSIS — I4892 Unspecified atrial flutter: Secondary | ICD-10-CM | POA: Diagnosis not present

## 2023-11-28 NOTE — Progress Notes (Signed)
 Pt called for pre procedure instructions. Arrival time 0800 NPO after midnight explained Instructed to take am meds with sip of water and confirmed blood thinner consistency, eliquis  Instructed pt need for ride home tomorrow and have responsible adult with them for 24 hrs post procedure.

## 2023-11-29 ENCOUNTER — Other Ambulatory Visit: Payer: Self-pay

## 2023-11-29 ENCOUNTER — Encounter (HOSPITAL_COMMUNITY)
Admission: RE | Disposition: A | Discharge status: Home / Self Care | Payer: Self-pay | Source: Home / Self Care | Attending: Internal Medicine

## 2023-11-29 ENCOUNTER — Other Ambulatory Visit (HOSPITAL_COMMUNITY): Payer: Self-pay | Admitting: *Deleted

## 2023-11-29 ENCOUNTER — Ambulatory Visit (HOSPITAL_COMMUNITY)
Admission: RE | Admit: 2023-11-29 | Discharge: 2023-11-29 | Disposition: A | Discharge status: Home / Self Care | Attending: Internal Medicine | Admitting: Internal Medicine

## 2023-11-29 ENCOUNTER — Ambulatory Visit (HOSPITAL_COMMUNITY): Admitting: Registered Nurse

## 2023-11-29 DIAGNOSIS — I10 Essential (primary) hypertension: Secondary | ICD-10-CM | POA: Insufficient documentation

## 2023-11-29 DIAGNOSIS — J449 Chronic obstructive pulmonary disease, unspecified: Secondary | ICD-10-CM | POA: Insufficient documentation

## 2023-11-29 DIAGNOSIS — I4892 Unspecified atrial flutter: Secondary | ICD-10-CM | POA: Insufficient documentation

## 2023-11-29 DIAGNOSIS — I4819 Other persistent atrial fibrillation: Secondary | ICD-10-CM | POA: Insufficient documentation

## 2023-11-29 DIAGNOSIS — Z7984 Long term (current) use of oral hypoglycemic drugs: Secondary | ICD-10-CM | POA: Insufficient documentation

## 2023-11-29 DIAGNOSIS — E785 Hyperlipidemia, unspecified: Secondary | ICD-10-CM | POA: Diagnosis not present

## 2023-11-29 DIAGNOSIS — I712 Thoracic aortic aneurysm, without rupture, unspecified: Secondary | ICD-10-CM | POA: Insufficient documentation

## 2023-11-29 DIAGNOSIS — Z79899 Other long term (current) drug therapy: Secondary | ICD-10-CM | POA: Insufficient documentation

## 2023-11-29 DIAGNOSIS — Z7901 Long term (current) use of anticoagulants: Secondary | ICD-10-CM | POA: Diagnosis not present

## 2023-11-29 DIAGNOSIS — D6869 Other thrombophilia: Secondary | ICD-10-CM | POA: Insufficient documentation

## 2023-11-29 DIAGNOSIS — I700 Atherosclerosis of aorta: Secondary | ICD-10-CM | POA: Insufficient documentation

## 2023-11-29 DIAGNOSIS — E119 Type 2 diabetes mellitus without complications: Secondary | ICD-10-CM | POA: Insufficient documentation

## 2023-11-29 DIAGNOSIS — Z8673 Personal history of transient ischemic attack (TIA), and cerebral infarction without residual deficits: Secondary | ICD-10-CM | POA: Insufficient documentation

## 2023-11-29 HISTORY — PX: CARDIOVERSION: EP1203

## 2023-11-29 LAB — GLUCOSE, CAPILLARY: Glucose-Capillary: 167 mg/dL — ABNORMAL HIGH (ref 70–99)

## 2023-11-29 SURGERY — CARDIOVERSION (CATH LAB)
Anesthesia: General

## 2023-11-29 MED ORDER — SODIUM CHLORIDE 0.9 % IV SOLN: INTRAVENOUS | Status: DC

## 2023-11-29 MED ORDER — LIDOCAINE 2% (20 MG/ML) 5 ML SYRINGE
INTRAMUSCULAR | Status: DC | PRN
  Administered 2023-11-29: 60 mg via INTRAVENOUS

## 2023-11-29 MED ORDER — PROPOFOL 10 MG/ML IV BOLUS
INTRAVENOUS | Status: DC | PRN
  Administered 2023-11-29: 60 mg via INTRAVENOUS

## 2023-11-29 MED ORDER — FUROSEMIDE 40 MG PO TABS: 40.0000 mg | ORAL_TABLET | Freq: Two times a day (BID) | ORAL | 2 refills | Status: DC

## 2023-11-29 SURGICAL SUPPLY — 1 items: PAD DEFIB RADIO PHYSIO CONN (PAD) ×2 IMPLANT

## 2023-11-29 NOTE — Anesthesia Procedure Notes (Signed)
 Procedure Name: General with mask airway Date/Time: 11/29/2023 9:13 AM  Performed by: Vertie Arthea RAMAN, CRNAPre-anesthesia Checklist: Timeout performed, Patient being monitored, Suction available, Emergency Drugs available and Patient identified Patient Re-evaluated:Patient Re-evaluated prior to induction Oxygen Delivery Method: Nasal cannula Preoxygenation: Pre-oxygenation with 100% oxygen Induction Type: IV induction

## 2023-11-29 NOTE — Anesthesia Preprocedure Evaluation (Addendum)
 Anesthesia Evaluation  Patient identified by MRN, date of birth, ID band Patient awake    Reviewed: Allergy & Precautions, NPO status , Patient's Chart, lab work & pertinent test results  Airway Mallampati: II  TM Distance: >3 FB Neck ROM: Full    Dental no notable dental hx.    Pulmonary COPD,  COPD inhaler   Pulmonary exam normal        Cardiovascular hypertension, Pt. on medications and Pt. on home beta blockers + dysrhythmias Atrial Fibrillation  Rhythm:Irregular Rate:Normal  ECHO:  1. Left ventricular ejection fraction, by estimation, is 60 to 65%. The left ventricle has normal function. The left ventricle has no regional wall motion abnormalities. There is mild left ventricular hypertrophy. Left ventricular diastolic function could not be evaluated. Elevated left atrial pressure. There is the interventricular septum is flattened in systole and diastole, consistent with right ventricular pressure and volume overload.  2. Right ventricular systolic function is normal. The right ventricular size is normal. There is severely elevated pulmonary artery systolic pressure.  3. Left atrial size was severely dilated.  4. Right atrial size was mildly dilated.  5. The mitral valve is normal in structure. Mild mitral valve regurgitation. No evidence of mitral stenosis.  6. The aortic valve is tricuspid. Aortic valve regurgitation is not visualized. Aortic valve sclerosis is present, with no evidence of aortic valve stenosis.  7. Aortic dilatation noted. There is mild dilatation of the ascending aorta, measuring 45 mm.  8. The inferior vena cava is dilated in size with <50% respiratory variability, suggesting right atrial pressure of 15 mmHg.  9. Cannot exclude a small PFO.      Neuro/Psych CVA, No Residual Symptoms  negative psych ROS   GI/Hepatic negative GI ROS, Neg liver ROS,,,  Endo/Other  diabetes    Renal/GU   negative  genitourinary   Musculoskeletal negative musculoskeletal ROS (+)    Abdominal Normal abdominal exam  (+)   Peds  Hematology negative hematology ROS (+)   Anesthesia Other Findings   Reproductive/Obstetrics                              Anesthesia Physical Anesthesia Plan  ASA: 3  Anesthesia Plan: General   Post-op Pain Management:    Induction: Intravenous  PONV Risk Score and Plan: 2 and Treatment may vary due to age or medical condition  Airway Management Planned: Mask  Additional Equipment: None  Intra-op Plan:   Post-operative Plan:   Informed Consent: I have reviewed the patients History and Physical, chart, labs and discussed the procedure including the risks, benefits and alternatives for the proposed anesthesia with the patient or authorized representative who has indicated his/her understanding and acceptance.     Dental advisory given  Plan Discussed with: CRNA  Anesthesia Plan Comments:         Anesthesia Quick Evaluation

## 2023-11-29 NOTE — Procedures (Signed)
    Electrical Cardioversion Procedure Note Chris Lucas [id number] 19-May-1950  Procedure: Electrical Cardioversion Indications:  Atrial Flutter  Time Out: Verified patient identification, verified procedure,medications/allergies/relevent history reviewed, required imaging and test results available.  Performed  Procedure Details  The patient was NPO after midnight. Anesthesia was administered at the beside  by Dr.Stolzfus with 60mg  of lidocaine  and 60 mg propofol .  Cardioversion was done with synchronized biphasic defibrillation with AP pads with 200 Joules.  The patient converted to sinus rhythm The patient tolerated the procedure well.  IMPRESSION:  Successful cardioversion of atrial flutter.   Stanly Leavens, MD FASE [facility] Cardiologist [facility]  [address] [place], [address] [place], [place] [address] [phone]  9:42 AM

## 2023-11-29 NOTE — Interval H&P Note (Signed)
 History and Physical Interval Note:  11/29/2023 9:04 AM  Chris Lucas  has presented today for surgery, with the diagnosis of AFIB.  The various methods of treatment have been discussed with the patient and family. After consideration of risks, benefits and other options for treatment, the patient has consented to  Procedure(s): CARDIOVERSION (N/A) as a surgical intervention.  The patient's history has been reviewed, patient examined, no change in status, stable for surgery.  I have reviewed the patient's chart and labs.  Questions were answered to the patient's satisfaction.  Reviewed his lower extremity edema and discuss his shortness of breath.   Shannen Flansburg A Cullin Dishman

## 2023-11-29 NOTE — Anesthesia Postprocedure Evaluation (Signed)
 Anesthesia Post Note  Patient: Chris Lucas  Procedure(s) Performed: CARDIOVERSION     Patient location during evaluation: PACU Anesthesia Type: General Level of consciousness: awake and alert Pain management: pain level controlled Vital Signs Assessment: post-procedure vital signs reviewed and stable Respiratory status: spontaneous breathing, nonlabored ventilation, respiratory function stable and patient connected to nasal cannula oxygen Cardiovascular status: blood pressure returned to baseline and stable Postop Assessment: no apparent nausea or vomiting Anesthetic complications: no   No notable events documented.  Last Vitals:  Vitals:   11/29/23 0929 11/29/23 0939  BP: (!) 129/90 (!) 127/92  Pulse: 72 74  Resp: 13 19  Temp:    SpO2: 100% 98%    Last Pain:  Vitals:   11/29/23 0939  TempSrc:   PainSc: 0-No pain                 Cordella SQUIBB Kamil Mchaffie

## 2023-11-29 NOTE — Transfer of Care (Signed)
 Immediate Anesthesia Transfer of Care Note  Patient: Chris Lucas  Procedure(s) Performed: CARDIOVERSION  Patient Location: PACU  Anesthesia Type:General  Level of Consciousness: drowsy  Airway & Oxygen Therapy: Patient Spontanous Breathing and Patient connected to nasal cannula oxygen  Post-op Assessment: Report given to RN and Post -op Vital signs reviewed and stable  Post vital signs: Reviewed and stable  Last Vitals:  Vitals Value Taken Time  BP 135/103 11/29/23 09:19  Temp 36.4 C 11/29/23 09:19  Pulse 72 11/29/23 09:19  Resp 14 11/29/23 09:19  SpO2 100 % 11/29/23 09:19    Last Pain:  Vitals:   11/29/23 0919  TempSrc: Tympanic  PainSc: Asleep         Complications: No notable events documented.

## 2023-11-29 NOTE — Discharge Instructions (Signed)

## 2023-11-30 ENCOUNTER — Encounter (HOSPITAL_COMMUNITY): Payer: Self-pay | Admitting: Internal Medicine

## 2023-12-12 ENCOUNTER — Other Ambulatory Visit: Payer: Self-pay | Admitting: Internal Medicine

## 2023-12-16 DIAGNOSIS — I483 Typical atrial flutter: Secondary | ICD-10-CM | POA: Diagnosis not present

## 2023-12-16 DIAGNOSIS — I10 Essential (primary) hypertension: Secondary | ICD-10-CM | POA: Diagnosis not present

## 2023-12-16 DIAGNOSIS — E119 Type 2 diabetes mellitus without complications: Secondary | ICD-10-CM | POA: Diagnosis not present

## 2023-12-16 DIAGNOSIS — I7121 Aneurysm of the ascending aorta, without rupture: Secondary | ICD-10-CM | POA: Diagnosis not present

## 2023-12-16 DIAGNOSIS — E785 Hyperlipidemia, unspecified: Secondary | ICD-10-CM | POA: Diagnosis not present

## 2023-12-16 DIAGNOSIS — R0602 Shortness of breath: Secondary | ICD-10-CM | POA: Diagnosis not present

## 2023-12-16 DIAGNOSIS — I4819 Other persistent atrial fibrillation: Secondary | ICD-10-CM | POA: Diagnosis not present

## 2023-12-16 DIAGNOSIS — Z8673 Personal history of transient ischemic attack (TIA), and cerebral infarction without residual deficits: Secondary | ICD-10-CM | POA: Diagnosis not present

## 2023-12-16 DIAGNOSIS — I700 Atherosclerosis of aorta: Secondary | ICD-10-CM | POA: Diagnosis not present

## 2023-12-17 LAB — CBC
Hematocrit: 46.2 % (ref 37.5–51.0)
Hemoglobin: 14.9 g/dL (ref 13.0–17.7)
MCH: 31 pg (ref 26.6–33.0)
MCHC: 32.3 g/dL (ref 31.5–35.7)
MCV: 96 fL (ref 79–97)
Platelets: 192 x10E3/uL (ref 150–450)
RBC: 4.81 x10E6/uL (ref 4.14–5.80)
RDW: 13.1 % (ref 11.6–15.4)
WBC: 4.1 x10E3/uL (ref 3.4–10.8)

## 2023-12-17 LAB — BASIC METABOLIC PANEL WITH GFR
BUN/Creatinine Ratio: 12 (ref 10–24)
BUN: 18 mg/dL (ref 8–27)
CO2: 27 mmol/L (ref 20–29)
Calcium: 9.6 mg/dL (ref 8.6–10.2)
Chloride: 100 mmol/L (ref 96–106)
Creatinine, Ser: 1.51 mg/dL — ABNORMAL HIGH (ref 0.76–1.27)
Glucose: 124 mg/dL — ABNORMAL HIGH (ref 70–99)
Potassium: 4.8 mmol/L (ref 3.5–5.2)
Sodium: 142 mmol/L (ref 134–144)
eGFR: 49 mL/min/1.73 — ABNORMAL LOW (ref >59)

## 2023-12-17 LAB — TSH: TSH: 2.44 u[IU]/mL (ref 0.450–4.500)

## 2023-12-17 LAB — MAGNESIUM: Magnesium: 2.2 mg/dL (ref 1.6–2.3)

## 2023-12-20 ENCOUNTER — Ambulatory Visit (HOSPITAL_COMMUNITY)
Admission: RE | Admit: 2023-12-20 | Discharge: 2023-12-20 | Disposition: A | Discharge status: Home / Self Care | Source: Ambulatory Visit | Attending: Internal Medicine | Admitting: Internal Medicine

## 2023-12-20 ENCOUNTER — Encounter (HOSPITAL_COMMUNITY): Payer: Self-pay | Admitting: Internal Medicine

## 2023-12-20 VITALS — BP 146/100 | HR 68 | Ht 73.0 in | Wt 233.4 lb

## 2023-12-20 DIAGNOSIS — I4892 Unspecified atrial flutter: Secondary | ICD-10-CM | POA: Diagnosis not present

## 2023-12-20 DIAGNOSIS — I4819 Other persistent atrial fibrillation: Secondary | ICD-10-CM

## 2023-12-20 DIAGNOSIS — D6869 Other thrombophilia: Secondary | ICD-10-CM | POA: Diagnosis not present

## 2023-12-20 NOTE — Progress Notes (Signed)
 Primary Care Physician: Shona Norleen PEDLAR, MD Primary Cardiologist: Alvan Ronal BRAVO, MD (Inactive) Electrophysiologist: None     Referring Physician: Damien Braver, NP     Chris Lucas is a 73 y.o. 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 Seen by Cardiology on 10/09/23 and note to be in atrial flutter; patient did not have cardiac awareness. He had missed a dose of Eliquis  the week prior to cardiology visit. Patient is on Eliquis  5 mg BID for a CHADS2VASC score of 6.  On evaluation today, he is currently in atrial flutter. He missed a dose of Eliquis  last night and has not taken his medications this morning. He notes to have SOB, some fatigue, and some LE edema. 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    On follow up 12/20/23, patient is currently in atrial flutter. S/p successful DCCV on 11/29/23. No missed doses of Eliquis . His lasix  was increased to 40 mg BID until day of cardioversion and patient resumed normal dosing after (once daily). Patient notes fatigue and SOB.   Today, he denies symptoms of palpitations, chest pain, orthopnea, PND, lower extremity edema, dizziness, presyncope, syncope, snoring, daytime somnolence, bleeding, or neurologic sequela. The patient is tolerating medications without difficulties and is otherwise without complaint today.    he has a BMI of Body mass index is 30.79 kg/m.SABRA Filed Weights   12/20/23 1123  Weight: 105.9 kg     Current Outpatient Medications  Medication Sig Dispense Refill   albuterol  (VENTOLIN  HFA) 108 (90 Base) MCG/ACT inhaler Inhale 2 puffs into the lungs every 6 (six) hours as needed for wheezing or shortness of breath. 8 g 6   apixaban  (ELIQUIS ) 5 MG TABS tablet Take 5 mg by mouth 2  (two) times daily.     atorvastatin  (LIPITOR) 40 MG tablet Take 40 mg by mouth daily.     carvedilol  (COREG ) 25 MG tablet TAKE 1 TABLET BY MOUTH TWICE A DAY 180 tablet 3   cloNIDine  (CATAPRES ) 0.1 MG tablet TAKE 1 TABLET BY MOUTH TWICE DAILY**MAY TAKE 1 EXTRA TABLET DAILY FOR SYSTOLIC PRESSURE >150 180 tablet 2   Dapagliflozin  Pro-metFORMIN ER (XIGDUO XR) 01-999 MG TB24 Take 1 tablet by mouth in the morning.     furosemide  (LASIX ) 40 MG tablet Take 1 tablet (40 mg total) by mouth daily. 90 tablet 3   irbesartan  (AVAPRO ) 300 MG tablet TAKE 1 TABLET BY MOUTH EVERY DAY 90 tablet 3   loratadine  (CLARITIN ) 10 MG tablet Take 10 mg by mouth daily as needed for allergies.     ondansetron  (ZOFRAN -ODT) 4 MG disintegrating tablet Take 1 tablet (4 mg total) by mouth every 8 (eight) hours as needed for nausea or vomiting. 20 tablet 0   Vitamin D, Ergocalciferol, (DRISDOL) 1.25 MG (50000 UNIT) CAPS capsule Take 50,000 Units by mouth every Sunday.     No current facility-administered medications for this encounter.    Atrial Fibrillation Management history:  Previous antiarrhythmic drugs: none Previous cardioversions: 09/01/21, 11/29/23 Previous ablations: none Anticoagulation history: Eliquis    ROS- All systems are reviewed and negative except as per the HPI above.  Physical Exam: BP (!) 146/100   Pulse 68   Ht 6' 1 (1.854 m)   Wt 105.9 kg  BMI 30.79 kg/m   GEN- The patient is well appearing, alert and oriented x 3 today.   Neck - no JVD or carotid bruit noted Lungs- Clear to ausculation bilaterally, normal work of breathing Heart- Regular rate (atrial flutter), no murmurs, rubs or gallops, PMI not laterally displaced Extremities- no clubbing, cyanosis, or edema Skin - no rash or ecchymosis noted   EKG today demonstrates  Vent. rate 68 BPM PR interval * ms QRS duration 86 ms QT/QTcB 308/327 ms P-R-T axes 92 112 72 Atrial flutter with 4:1 A-V conduction Right axis  deviation Nonspecific ST and T wave abnormality Abnormal ECG When compared with ECG of 29-Nov-2023 09:27, Atrial flutter has replaced Sinus rhythm   Echo 11/22/23: 1. Left ventricular ejection fraction, by estimation, is 60 to 65%. The  left ventricle has normal function. The left ventricle has no regional  wall motion abnormalities. There is mild left ventricular hypertrophy.  Left ventricular diastolic function  could not be evaluated. Elevated left atrial pressure. There is the  interventricular septum is flattened in systole and diastole, consistent  with right ventricular pressure and volume overload.   2. Right ventricular systolic function is normal. The right ventricular  size is normal. There is severely elevated pulmonary artery systolic  pressure.   3. Left atrial size was severely dilated.   4. Right atrial size was mildly dilated.   5. The mitral valve is normal in structure. Mild mitral valve  regurgitation. No evidence of mitral stenosis.   6. The aortic valve is tricuspid. Aortic valve regurgitation is not  visualized. Aortic valve sclerosis is present, with no evidence of aortic  valve stenosis.   7. Aortic dilatation noted. There is mild dilatation of the ascending  aorta, measuring 45 mm.   8. The inferior vena cava is dilated in size with <50% respiratory  variability, suggesting right atrial pressure of 15 mmHg.   9. Cannot exclude a small PFO.    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   S/p successful DCCV on 11/29/23.  Patient is currently in atrial flutter. Continue coreg  25 mg BID. We discussed rhythm control options which include AAD  therapy and ablation. Previous ECGs possible indicate a typical atrial flutter, where as today maybe shows more atypical flutter. We discussed medication therapy and ablation. Aortic atherosclerosis by CT imaging makes flecainide less of an option, and given age would like to avoid amiodarone. His Qtc in NSR appears to be prohibitive of Tikosyn. After discussion, patient would like to discuss potential for ablation given early return of arrhythmia. Will refer to EP to discuss ablation; it appears main AAD option would be Multaq going forward to avoid potential toxicity from amiodarone. We went over the ablation procedure and what to expect.   PR 256 ms Qtc 494 ms  Secondary Hypercoagulable State (ICD10:  D68.69) The patient is at significant risk for stroke/thromboembolism based upon his CHA2DS2-VASc Score of 6.  Continue Apixaban  (Eliquis ).  Continue Eliquis .     Referral to EP to discuss potential ablation.    Terra Pac, PA-C  [facility] [address] [place], [place] [address] [phone]

## 2024-01-05 NOTE — Progress Notes (Unsigned)
 Cardiology Office Note   Date:  01/06/2024  ID:  Chris Lucas, Chris Lucas [birth year], MRN [id number] PCP: Shona Norleen PEDLAR, MD  [facility] Providers Cardiologist:  Alvan Ronal BRAVO, MD (Inactive) Electrophysiologist:  Donnice DELENA Primus, MD   History of Present Illness Chris Lucas is a 73 y.o. male with persistent AFL/paroxysmal AF, HTN, HLD, DM2, CVA, aortic atherosclerosis, thoracic aortic aneurysm, carcinoid tumor of the lung s/p L upper lobectomy, and L hemidiaphragm paralysis who presents for management of typical atrial flutter.   He has had typical atrial flutter on multiple ECGs between 06/2021 and present.  He was cardioverted on 11/29/2023.   He is minimally symptomatic with regards to his atrial arrhythmias and is currently in persistent atrial flutter.  He presents today with his wife.  He lives locally in a [place] with his wife. His son is 64 and lives in [place].  Both him and his wife are retired.  He used to be a Industrial/product designer for dialysis patients and his wife used to be a Pension scheme manager.  ROS: fatigue   Studies Reviewed  ECG review 12/20/23: AFL/VR 68, QRS 86, QT/c 308/327 11/29/23: NSR 72, PR 256, QRS 96, QT/c 452/494, 1' AVB 11/08/23: typical AFL/VR 78, QRS 84, QT/c 430/490 10/09/23: typical AFL/VR 77, QRS 88, QT/c 426/482 02/21/23: SB 59, PR 254, QRS 102, QT/c 480/475, 1' AVB 10/19/21: AFL/VR 68, QRS 86, QT/c 434/461 09/01/21: NSR 76, PR 254, QRS 94, QT/c 432/486, 1' AVB 08/29/21: typical AFL/VR 57, QRS 90, QT/c 476/463 07/06/21: (12:41:25) typical AFL/VR 68, QRS 106, QT/c 499/531 03/26/07: NSR 81, PR 180, QRS 90, QT/c 398/462  TTE Result date: 11/22/23 1. Left ventricular ejection fraction, by estimation, is 60 to 65%. The  left ventricle has normal function. The left ventricle has no regional  wall motion abnormalities. There is mild left ventricular hypertrophy.  Left ventricular diastolic function  could not be evaluated.  Elevated left atrial pressure. There is the  interventricular septum is flattened in systole and diastole, consistent  with right ventricular pressure and volume overload.   2. Right ventricular systolic function is normal. The right ventricular  size is normal. There is severely elevated pulmonary artery systolic  pressure.   3. Left atrial size was severely dilated.   4. Right atrial size was mildly dilated.   5. The mitral valve is normal in structure. Mild mitral valve  regurgitation. No evidence of mitral stenosis.   6. The aortic valve is tricuspid. Aortic valve regurgitation is not  visualized. Aortic valve sclerosis is present, with no evidence of aortic  valve stenosis.   7. Aortic dilatation noted. There is mild dilatation of the ascending  aorta, measuring 45 mm.   8. The inferior vena cava is dilated in size with <50% respiratory  variability, suggesting right atrial pressure of 15 mmHg.   9. Cannot exclude a small PFO.   Risk Assessment/Calculations  CHA2DS2-VASc Score = 6  This indicates a 9.7% annual risk of stroke. The patient's score is based upon: CHF History: 0 HTN History: 1 Diabetes History: 1 Stroke History: 2 Vascular Disease History: 1 Age Score: 1 Gender Score: 0  Physical Exam VS:  BP (!) 142/80   Pulse (!) 52   Ht 6' 1 (1.854 m)   Wt 233 lb (105.7 kg)   SpO2 92%   BMI 30.74 kg/m       Wt Readings from Last 3 Encounters:  01/06/24 233 lb (105.7 kg)  12/20/23 233  lb 6.4 oz (105.9 kg)  11/29/23 229 lb (103.9 kg)    GEN: Well nourished, well developed in no acute distress CARDIAC: RRR, no murmurs, rubs, gallops RESPIRATORY:  Clear to auscultation without rales, wheezing or rhonchi  EXTREMITIES:  No edema; No deformity   ASSESSMENT AND PLAN Chris Lucas is a 73 y.o. 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     Typical atrial flutter Discussed benefits of rhythm management with typical atrial flutter.  Have not seen any evidence of atrial fibrillation on prior ECGs or monitoring.  He and his wife want to consider ablation further and will contact me you once they have decided on a plan.  He had recurrent atrial flutter after his last cardioversion. He would be a candidate for amiodarone, dronedarone, or Tikosyn with his QTc however with atrial flutter being his predominant rhythm ablation would be the potentially curative option and I do not know how well medications would control his atrial flutter that has been persistent.  He had atrial fibrillation reported when he underwent carcinoid tumor resection.  We discussed concomitant atrial flutter ablation as well as atrial fibrillation ablation with PVI and likely posterior wall isolation.  He has severe left atrial enlargement and understands that although CTI ablation would likely be curative if he could have subsequent atypical atrial flutter's as well as paroxysmal atrial fibrillation that could become more persistent with time he may need antiarrhythmic medications for further management.  Discussed treatment options today for AF/AFL including antiarrhythmic drug therapy and ablation. Discussed risks, recovery and likelihood of success with each treatment strategy. Risk, benefits, and alternatives to EP study and ablation for AF/AFL were discussed. These risks include but are not limited to stroke, bleeding, vascular damage, tamponade, perforation, lungs, and other structures, worsening renal function, coronary vasospasm and death. We also reviewed the rare but possible risk of damage to intrinsic conduction with AFL ablation requiring a PPM implant and this risk as 1/250. Discussed potential need for repeat ablation procedures and antiarrhythmic drugs after an initial ablation. The patient understands these risk and  wishes to proceed.  We will therefore proceed with catheter ablation at the next available time.       Dispo: f/u in 6 months, provided my contact information if he would like to proceed with scheduling ablation once he and his wife had time to consider   A total of 50 minutes was spent preparing for the patient, reviewing history, performing exam, document encounter, coordinating care and counseling the patient. 35 minutes was spent with direct patient care.   Signed, Donnice DELENA Primus, MD

## 2024-01-06 ENCOUNTER — Encounter: Payer: Self-pay | Admitting: Student in an Organized Health Care Education/Training Program

## 2024-01-06 ENCOUNTER — Ambulatory Visit
Attending: Student in an Organized Health Care Education/Training Program | Admitting: Student in an Organized Health Care Education/Training Program

## 2024-01-06 VITALS — BP 142/80 | HR 52 | Ht 73.0 in | Wt 233.0 lb

## 2024-01-06 DIAGNOSIS — I4892 Unspecified atrial flutter: Secondary | ICD-10-CM | POA: Diagnosis not present

## 2024-01-06 NOTE — Patient Instructions (Signed)
 Medication Instructions:  Your physician recommends that you continue on your current medications as directed. Please refer to the Current Medication list given to you today.  *If you need a refill on your cardiac medications before your next appointment, please call your pharmacy*   Follow-Up: At [facility], you and your health needs are our priority.  As part of our continuing mission to provide you with exceptional heart care, our providers are all part of one team.  This team includes your primary Cardiologist (physician) and Advanced Practice Providers or APPs (Physician Assistants and Nurse Practitioners) who all work together to provide you with the care you need, when you need it.  Your next appointment:   6 months  Provider:   Donnice Primus, MD

## 2024-01-20 NOTE — Progress Notes (Unsigned)
 Cardiology Office Note:  .   Date:  01/22/2024  ID:  Chris Lucas, DOB Jun 14, 1950, MRN [id number] PCP: Shona Norleen PEDLAR, MD  [facility] Providers Cardiologist:  Alvan Ronal BRAVO, MD (Inactive) Electrophysiologist:  Donnice DELENA Primus, MD { History of Present Illness: .    Chief Complaint  Patient presents with   Follow-up         Chris Lucas is a 73 y.o. male with history of AFL/fib, HTN, CKD 3a who presents for follow-up.    History of Present Illness   Chris Lucas is a 73 year old male with atrial fibrillation and atrial flutter, CKD 3a, HTN, pulm HTN, HFpEF who presents for follow-up. He is accompanied by his wife.   He has a history of atrial fibrillation and atrial flutter, with a recent cardioversion performed on November 29, 2023. There is a recurrence of atrial fibrillation and atrial flutter, confirmed by an EKG on December 20, 2023. He is considering an ablation procedure but remains undecided.  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 He experiences occasional labored breathing, especially when changing temperature environments, and attributes some of his breathing difficulties to fluid retention.  He has hypertension and did not take his blood pressure medication on the day of the visit. He checks his blood pressure regularly and notes it was high recently. He is currently on carvedilol  25 mg BID, clonidine  0.1 mg daily, and irbesartan  300 mg daily. BP 170/110 today. Did not take meds.   He has chronic kidney disease stage 3A and is on Lasix  40 mg daily for fluid management.  He has a history of diabetes and is on Eliquis  5 mg BID and Lipitor 40 mg daily. No history of heart attack but mentions a stroke in 2023, which did not result in any loss of memory or motor function.  He does not smoke or drink and has never done so. He has one son and no grandchildren. He  is not currently working and is cautious about physical activity due to a thoracic aortic aneurysm, which is stable at 4.5 cm.          Problem List Atrial flutter/fib -DCCV 08/2021 -DCCV 11/29/2023 2. HTN 3. DM -A1c 8.2 4. 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 Thoracic aortic aneurysm  -45 mm 06/08/2023 7. HFpEF 8. Pulm HTN -severe 9. HLD -T chol 91, HDL 33, LDL 38, TG 105    ROS: All other ROS reviewed and negative. Pertinent positives noted in the HPI.     Studies Reviewed: SABRA       TTE 11/22/2023  1. Left ventricular ejection fraction, by estimation, is 60 to 65%. The  left ventricle has normal function. The left ventricle has no regional  wall motion abnormalities. There is mild left ventricular hypertrophy.  Left ventricular diastolic function  could not be evaluated. Elevated left atrial pressure. There is the  interventricular septum is flattened in systole and diastole, consistent  with right ventricular pressure and volume overload.   2. Right ventricular systolic function is normal. The right ventricular  size is normal. There is severely elevated pulmonary artery systolic  pressure.   3. Left atrial size was severely dilated.   4. Right atrial size was mildly dilated.   5. The mitral valve is normal in structure. Mild mitral valve  regurgitation. No evidence of mitral  stenosis.   6. The aortic valve is tricuspid. Aortic valve regurgitation is not  visualized. Aortic valve sclerosis is present, with no evidence of aortic  valve stenosis.   7. Aortic dilatation noted. There is mild dilatation of the ascending  aorta, measuring 45 mm.   8. The inferior vena cava is dilated in size with <50% respiratory  variability, suggesting right atrial pressure of 15 mmHg.   9. Cannot exclude a small PFO.  Physical Exam:   VS:  BP (!) 170/110   Pulse (!) 43   Ht 6' 1 (1.854 m)   Wt 232 lb (105.2 kg)   SpO2 92%   BMI 30.61 kg/m    Wt Readings from  Last 3 Encounters:  01/22/24 232 lb (105.2 kg)  01/06/24 233 lb (105.7 kg)  12/20/23 233 lb 6.4 oz (105.9 kg)    GEN: Well nourished, well developed in no acute distress NECK: No JVD; No carotid bruits CARDIAC: irregular rhythm, no murmurs, rubs, gallops RESPIRATORY:  Clear to auscultation without rales, wheezing or rhonchi  ABDOMEN: Soft, non-tender, non-distended EXTREMITIES: 2+ pitting edema  ASSESSMENT AND PLAN: .   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 Fluid retention likely due to high salt intake and poorly controlled hypertension. - Increase Lasix  to 40 mg twice daily for three days, then resume 40 mg daily. - Provided education on salt intake reduction. - Encouraged regular exercise within safe limits. - suspect pHTN is related to HFpEF.  - We will discuss LHC/RHC at next visit  Persistent atrial fibrillation and atrial flutter, status post cardioversion Persistent atrial fibrillation and atrial flutter post cardioversion. Considering ablation with high success rate and low mortality risk. Procedure could reduce fluid retention and improve symptoms. - Discussed potential benefits and risks of ablation. - Encouraged consideration of ablation to improve rhythm control and reduce fluid retention. - Continue coreg  25 mg BID and eliquis  5 mg BID.   Severe pulmonary hypertension with right ventricular dilation Severe pulmonary hypertension with right ventricular dilation contributing to right heart failure. - Continue medical management for pulmonary hypertension. - Consider left and right heart catheterization if symptoms persist or worsen. - I suspect this is all HFpEF related   Essential hypertension, poorly controlled Poorly controlled essential hypertension with recent BP of 170/110 mmHg. High BP  contributes to fluid retention and exacerbates heart failure. - Start checking blood pressure twice daily and maintain a log. - Adjust medications based on BP readings, particularly if systolic BP is over 150 mmHg. - Educated on medication adherence and lifestyle modifications, including salt reduction. - Continue coreg  25 mg BID, irbesartan  300 mg daily, clonidine  0.1 mg daily. Will add hydralazine /imdur if needed   Chronic kidney disease stage 3A Chronic kidney disease stage 3A with eGFR of 48. Kidney function impacts fluid management and blood pressure control. - Educated on the impact of salt intake on kidney function and fluid retention.  Thoracic aortic aneurysm, ascending, stable, without rupture Thoracic aortic aneurysm, ascending, stable at 4.5 cm, without rupture. Surgical intervention not currently indicated. - Continue annual monitoring of the aneurysm size. - Stable for 2 years - yearly CT or echo. They match well.              Follow-up: Return in about 6 weeks (around 03/04/2024).   Signed, Darryle DASEN. Barbaraann, MD, FACC [facility]  [facility]  [address] [place], [place] [address] [phone]  10:47 AM

## 2024-01-22 ENCOUNTER — Ambulatory Visit: Attending: Cardiovascular Disease | Admitting: Cardiovascular Disease

## 2024-01-22 ENCOUNTER — Encounter: Payer: Self-pay | Admitting: Cardiovascular Disease

## 2024-01-22 VITALS — BP 170/110 | HR 43 | Ht 73.0 in | Wt 232.0 lb

## 2024-01-22 DIAGNOSIS — I7121 Aneurysm of the ascending aorta, without rupture: Secondary | ICD-10-CM

## 2024-01-22 DIAGNOSIS — N1832 Chronic kidney disease, stage 3b: Secondary | ICD-10-CM | POA: Insufficient documentation

## 2024-01-22 DIAGNOSIS — I4819 Other persistent atrial fibrillation: Secondary | ICD-10-CM | POA: Diagnosis not present

## 2024-01-22 DIAGNOSIS — I483 Typical atrial flutter: Secondary | ICD-10-CM | POA: Diagnosis not present

## 2024-01-22 DIAGNOSIS — I10 Essential (primary) hypertension: Secondary | ICD-10-CM

## 2024-01-22 DIAGNOSIS — E785 Hyperlipidemia, unspecified: Secondary | ICD-10-CM | POA: Diagnosis not present

## 2024-01-22 DIAGNOSIS — I5033 Acute on chronic diastolic (congestive) heart failure: Secondary | ICD-10-CM | POA: Diagnosis not present

## 2024-01-22 NOTE — Patient Instructions (Addendum)
 Medication Instructions:  Your physician has recommended you make the following change in your medication:  INCREASE: furosemide  (Lasix ) to 40 mg by mouth twice daily for 3 days THEN RETURN to 40 mg by mouth once daily  *If you need a refill on your cardiac medications before your next appointment, please call your pharmacy*  Lab Work: NONE If you have labs (blood work) drawn today and your tests are completely normal, you will receive your results only by: MyChart Message (if you have MyChart) OR A paper copy in the mail If you have any lab test that is abnormal or we need to change your treatment, we will call you to review the results.  Testing/Procedures: NONE  Follow-Up: At [facility], you and your health needs are our priority.  As part of our continuing mission to provide you with exceptional heart care, our providers are all part of one team.  This team includes your primary Cardiologist (physician) and Advanced Practice Providers or APPs (Physician Assistants and Nurse Practitioners) who all work together to provide you with the care you need, when you need it.  Your next appointment:   6-8 week(s)  Provider:   Dr. Burton    Other Instructions Please monitor your Blood Pressure once in the morning and once in the evening and keep a log.  Your BP goal is 130/80   [facility]:

## 2024-03-04 DIAGNOSIS — E1122 Type 2 diabetes mellitus with diabetic chronic kidney disease: Secondary | ICD-10-CM | POA: Diagnosis not present

## 2024-03-04 DIAGNOSIS — E782 Mixed hyperlipidemia: Secondary | ICD-10-CM | POA: Diagnosis not present

## 2024-03-04 DIAGNOSIS — E559 Vitamin D deficiency, unspecified: Secondary | ICD-10-CM | POA: Diagnosis not present

## 2024-03-06 LAB — LAB REPORT - SCANNED
A1c: 8
Albumin, Urine POC: 266.2
Creatinine, POC: 110.7 mg/dL
EGFR: 44
Microalb Creat Ratio: 240

## 2024-03-11 NOTE — Progress Notes (Unsigned)
 Cardiology Office Note:  .   Date:  03/12/2024  ID:  Chris Lucas, DOB 08-28-1950, MRN [id number] PCP: Shona Norleen PEDLAR, MD  [facility] Providers Cardiologist:  Darryle ONEIDA Decent, MD Electrophysiologist:  Donnice DELENA Primus, MD   History of Present Illness: .    Chief Complaint  Patient presents with   Follow-up    Chris Lucas is a 73 y.o. male with history of pHTN, HFpEF, DM, HTN, HLD who presents for follow-up.    History of Present Illness   Chris Lucas is a 73 year old male with persistent atrial flutter, HFpEF, Cor pulmonale, CKD 3b, HTN, DM who presents for follow-up.  He experiences persistent atrial flutter with associated fluid retention. He takes Lasix  and adjusts the dosage around certain procedures. He experiences shortness of breath, particularly with temperature changes, and occasional chest tightness. No significant chest pain is reported.  He has a history of hypertension and is on multiple antihypertensive medications including carvedilol , irbesartan , and clonidine . He adjusts clonidine  based on blood pressure readings.  He has diabetes and chronic kidney disease stage 3A/B, which are part of his ongoing medical management. He takes Jardiance for diabetes management.  He has a thoracic aortic aneurysm measuring 4.5 cm, which is being monitored. 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  He underwent lung surgery in 2023 for tumor removal and was informed of having severe emphysema and a paralyzed diaphragm. He denies having COPD but acknowledges a history of severe emphysema.  He is on Eliquis , a blood thinner, which he needs to hold before certain procedures. He has a known allergy to contrast dye, identified during a previous heart catheterization.          Problem List Atrial flutter/fib -DCCV 08/2021 -DCCV 11/29/2023 2. HTN 3. DM -A1c 8.0 4. CKD 3a/b -eGFR 44  5. Lung carcinoid  tumor  -s/p LUL resection  6. Thoracic aortic aneurysm  -45 mm 06/08/2023 7. HFpEF 8. Pulm HTN -severe RVSP 79 mmHG 9. HLD -T chol 86, TG 65, HDL 39, LDL 32 10. COPD -moderate to severe obstructive disease 2023    ROS: All other ROS reviewed and negative. Pertinent positives noted in the HPI.     Studies Reviewed: SABRA   EKG Interpretation Date/Time:  Thursday March 12 2024 09:48:00 EST Ventricular Rate:  67 PR Interval:    QRS Duration:  88 QT Interval:  448 QTC Calculation: 473 R Axis:   149  Text Interpretation: Atrial flutter with variable A-V block with premature ventricular or aberrantly conducted complexes Right axis deviation Cannot rule out Anterior infarct , age undetermined ST & T wave abnormality, consider inferolateral ischemia Confirmed by Decent Darryle [phone]) on 03/12/2024 10:01:11 AM   PFT Moderate to severe obstructive disease  Physical Exam:   VS:  BP (!) 161/99   Pulse 67   Ht 6' 1 (1.854 m)   Wt 233 lb 3.2 oz (105.8 kg)   SpO2 98%   BMI 30.77 kg/m    Wt Readings from Last 3 Encounters:  03/12/24 233 lb 3.2 oz (105.8 kg)  01/22/24 232 lb (105.2 kg)  01/06/24 233 lb (105.7 kg)    GEN: Well nourished, well developed in no acute distress NECK: No JVD; No carotid bruits CARDIAC: irregular rhythm, no murmurs, rubs, gallops RESPIRATORY:  Clear to auscultation without rales, wheezing or rhonchi  ABDOMEN: Soft, non-tender, non-distended EXTREMITIES:  No edema; No  deformity  ASSESSMENT AND PLAN: .   Assessment and Plan    Chronic diastolic heart failure with severe pulmonary hypertension Persistent fluid retention and elevated heart pressures. Differential includes heart failure versus pulmonary etiology. Right and left heart catheterization needed to assess heart and lung pressures. Concerns about anesthesia due to heart condition. Discussed potential need for pulmonology referral if pulmonary etiology is confirmed. - Scheduled right and left heart  catheterization for next week. - Pretreat with prednisone due to contrast allergy. - Increased Lasix  to 40 mg BID. - Added hydralazine  50 mg TID. - Hold Eliquis  two days before catheterization. - Ordered CBC due to CKD stage 3a.  Persistent atrial flutter Secondary to fluid overload and elevated heart pressures. Rate control strategy in place. Ablation deferred until heart pressures are managed. Discussed potential for cardioversion and amiodarone if left-sided heart disease is worsening. - Continue rate control strategy. - Discuss potential ablation with EP doctor after heart pressures are managed.  Hypertension Management requires adjustment due to elevated blood pressure readings. Current regimen includes carvedilol , irbesartan , and clonidine . Hydralazine  added to regimen. - Added hydralazine  50 mg TID. - Continue carvedilol  25 mg BID. - Continue irbesartan  300 mg daily. - Continue clonidine  0.1 mg BID.  Aneurysm of ascending aorta Thoracic aortic aneurysm measuring 4.5 cm. Not currently dangerous but requires monitoring. Surgical intervention not feasible until heart condition is stabilized. - Continue monitoring aneurysm size and condition.  Chronic kidney disease stage 3a CKD stage 3a requires monitoring of kidney function, especially in context of heart failure and medication adjustments. - Ordered CBC to monitor kidney function.  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 Potential contribution to pulmonary hypertension. No current pulmonology follow-up. - Consider referral to pulmonologist if pulmonary etiology is confirmed after catheterization.          Informed Consent   Shared Decision Making/Informed Consent The risks [stroke (1 in 1000), death (1 in 1000), kidney failure [usually temporary] (1 in 500), bleeding (1 in 200), allergic reaction [possibly serious] (1 in 200)], benefits (diagnostic support and management of coronary artery disease)  and alternatives of a cardiac catheterization were discussed in detail with Mr. Hickel and he is willing to proceed.      Follow-up: Return in about 6 weeks (around 04/23/2024).  Signed, Darryle DASEN. Barbaraann, MD, [facility]  [facility]  [address] [place], [place] [address] [phone]  10:33 AM

## 2024-03-11 NOTE — H&P (View-Only) (Signed)
 Cardiology Office Note:  .   Date:  03/12/2024  ID:  Chris Lucas, DOB 30-Nov-1950, MRN [id number] PCP: Shona Norleen PEDLAR, MD  [facility] Providers Cardiologist:  Darryle ONEIDA Decent, MD Electrophysiologist:  Donnice DELENA Primus, MD   History of Present Illness: .    Chief Complaint  Patient presents with   Follow-up    Chris Lucas is a 73 y.o. male with history of pHTN, HFpEF, DM, HTN, HLD who presents for follow-up.    History of Present Illness   Chris Lucas is a 73 year old male with persistent atrial flutter, HFpEF, Cor pulmonale, CKD 3b, HTN, DM who presents for follow-up.  He experiences persistent atrial flutter with associated fluid retention. He takes Lasix  and adjusts the dosage around certain procedures. He experiences shortness of breath, particularly with temperature changes, and occasional chest tightness. No significant chest pain is reported.  He has a history of hypertension and is on multiple antihypertensive medications including carvedilol , irbesartan , and clonidine . He adjusts clonidine  based on blood pressure readings.  He has diabetes and chronic kidney disease stage 3A/B, which are part of his ongoing medical management. He takes Jardiance for diabetes management.  He has a thoracic aortic aneurysm measuring 4.5 cm, which is being monitored. 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  He underwent lung surgery in 2023 for tumor removal and was informed of having severe emphysema and a paralyzed diaphragm. He denies having COPD but acknowledges a history of severe emphysema.  He is on Eliquis , a blood thinner, which he needs to hold before certain procedures. He has a known allergy to contrast dye, identified during a previous heart catheterization.          Problem List Atrial flutter/fib -DCCV 08/2021 -DCCV 11/29/2023 2. HTN 3. DM -A1c 8.0 4. CKD 3a/b -eGFR 44  5. Lung carcinoid  tumor  -s/p LUL resection  6. Thoracic aortic aneurysm  -45 mm 06/08/2023 7. HFpEF 8. Pulm HTN -severe RVSP 79 mmHG 9. HLD -T chol 86, TG 65, HDL 39, LDL 32 10. COPD -moderate to severe obstructive disease 2023    ROS: All other ROS reviewed and negative. Pertinent positives noted in the HPI.     Studies Reviewed: SABRA   EKG Interpretation Date/Time:  Thursday March 12 2024 09:48:00 EST Ventricular Rate:  67 PR Interval:    QRS Duration:  88 QT Interval:  448 QTC Calculation: 473 R Axis:   149  Text Interpretation: Atrial flutter with variable A-V block with premature ventricular or aberrantly conducted complexes Right axis deviation Cannot rule out Anterior infarct , age undetermined ST & T wave abnormality, consider inferolateral ischemia Confirmed by Decent Darryle [phone]) on 03/12/2024 10:01:11 AM   PFT Moderate to severe obstructive disease  Physical Exam:   VS:  BP (!) 161/99   Pulse 67   Ht 6' 1 (1.854 m)   Wt 233 lb 3.2 oz (105.8 kg)   SpO2 98%   BMI 30.77 kg/m    Wt Readings from Last 3 Encounters:  03/12/24 233 lb 3.2 oz (105.8 kg)  01/22/24 232 lb (105.2 kg)  01/06/24 233 lb (105.7 kg)    GEN: Well nourished, well developed in no acute distress NECK: No JVD; No carotid bruits CARDIAC: irregular rhythm, no murmurs, rubs, gallops RESPIRATORY:  Clear to auscultation without rales, wheezing or rhonchi  ABDOMEN: Soft, non-tender, non-distended EXTREMITIES:  No edema; No  deformity  ASSESSMENT AND PLAN: .   Assessment and Plan    Chronic diastolic heart failure with severe pulmonary hypertension Persistent fluid retention and elevated heart pressures. Differential includes heart failure versus pulmonary etiology. Right and left heart catheterization needed to assess heart and lung pressures. Concerns about anesthesia due to heart condition. Discussed potential need for pulmonology referral if pulmonary etiology is confirmed. - Scheduled right and left heart  catheterization for next week. - Pretreat with prednisone  due to contrast allergy. - Increased Lasix  to 40 mg BID. - Added hydralazine  50 mg TID. - Hold Eliquis  two days before catheterization. - Ordered CBC due to CKD stage 3a.  Persistent atrial flutter Secondary to fluid overload and elevated heart pressures. Rate control strategy in place. Ablation deferred until heart pressures are managed. Discussed potential for cardioversion and amiodarone if left-sided heart disease is worsening. - Continue rate control strategy. - Discuss potential ablation with EP doctor after heart pressures are managed.  Hypertension Management requires adjustment due to elevated blood pressure readings. Current regimen includes carvedilol , irbesartan , and clonidine . Hydralazine  added to regimen. - Added hydralazine  50 mg TID. - Continue carvedilol  25 mg BID. - Continue irbesartan  300 mg daily. - Continue clonidine  0.1 mg BID.  Aneurysm of ascending aorta Thoracic aortic aneurysm measuring 4.5 cm. Not currently dangerous but requires monitoring. Surgical intervention not feasible until heart condition is stabilized. - Continue monitoring aneurysm size and condition.  Chronic kidney disease stage 3a CKD stage 3a requires monitoring of kidney function, especially in context of heart failure and medication adjustments. - Ordered CBC to monitor kidney function.  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 Potential contribution to pulmonary hypertension. No current pulmonology follow-up. - Consider referral to pulmonologist if pulmonary etiology is confirmed after catheterization.          Informed Consent   Shared Decision Making/Informed Consent The risks [stroke (1 in 1000), death (1 in 1000), kidney failure [usually temporary] (1 in 500), bleeding (1 in 200), allergic reaction [possibly serious] (1 in 200)], benefits (diagnostic support and management of coronary artery disease)  and alternatives of a cardiac catheterization were discussed in detail with Mr. Hoefling and he is willing to proceed.      Follow-up: Return in about 6 weeks (around 04/23/2024).  Signed, Darryle DASEN. Barbaraann, MD, [facility]  [facility]  [address] [place], [place] [address] [phone]  10:33 AM

## 2024-03-12 ENCOUNTER — Ambulatory Visit: Attending: Cardiovascular Disease | Admitting: Cardiovascular Disease

## 2024-03-12 ENCOUNTER — Ambulatory Visit: Payer: Self-pay | Admitting: Cardiovascular Disease

## 2024-03-12 ENCOUNTER — Encounter: Payer: Self-pay | Admitting: Cardiovascular Disease

## 2024-03-12 VITALS — BP 161/99 | HR 67 | Ht 73.0 in | Wt 233.2 lb

## 2024-03-12 DIAGNOSIS — I7121 Aneurysm of the ascending aorta, without rupture: Secondary | ICD-10-CM

## 2024-03-12 DIAGNOSIS — I272 Pulmonary hypertension, unspecified: Secondary | ICD-10-CM

## 2024-03-12 DIAGNOSIS — I4819 Other persistent atrial fibrillation: Secondary | ICD-10-CM | POA: Diagnosis not present

## 2024-03-12 DIAGNOSIS — N1831 Chronic kidney disease, stage 3a: Secondary | ICD-10-CM | POA: Diagnosis not present

## 2024-03-12 DIAGNOSIS — I5032 Chronic diastolic (congestive) heart failure: Secondary | ICD-10-CM

## 2024-03-12 MED ORDER — FUROSEMIDE 40 MG PO TABS: 40.0000 mg | ORAL_TABLET | Freq: Two times a day (BID) | ORAL | 3 refills | Status: DC

## 2024-03-12 MED ORDER — HYDRALAZINE HCL 50 MG PO TABS: 50.0000 mg | ORAL_TABLET | Freq: Three times a day (TID) | ORAL | 3 refills | Status: DC

## 2024-03-12 MED ORDER — PREDNISONE 50 MG PO TABS: ORAL_TABLET | ORAL | 0 refills | Status: DC

## 2024-03-12 NOTE — Patient Instructions (Addendum)
 Medication Instructions:  Your physician has recommended you make the following change in your medication:   -Start hydralazine  (apresoline ) 50mg  three times a day  -Increase furosemide  (lasix ) to 40mg  twice daily   *If you need a refill on your cardiac medications before your next appointment, please call your pharmacy*  Lab Work: Today- BMET & CBC  If you have labs (blood work) drawn today and your tests are completely normal, you will receive your results only by: MyChart Message (if you have MyChart) OR A paper copy in the mail If you have any lab test that is abnormal or we need to change your treatment, we will call you to review the results.  Testing/Procedures: See below  Follow-Up: At [facility], you and your health needs are our priority.  As part of our continuing mission to provide you with exceptional heart care, our providers are all part of one team.  This team includes your primary Cardiologist (physician) and Advanced Practice Providers or APPs (Physician Assistants and Nurse Practitioners) who all work together to provide you with the care you need, when you need it.  Your next appointment:   6 week(s)  Provider:   Darryle ONEIDA Decent, MD    We recommend signing up for the patient portal called MyChart.  Sign up information is provided on this After Visit Summary.  MyChart is used to connect with patients for Virtual Visits (Telemedicine).  Patients are able to view lab/test results, encounter notes, upcoming appointments, etc.  Non-urgent messages can be sent to your provider as well.   To learn more about what you can do with MyChart, go to [email or web address].   Other Instructions       Cardiac/Peripheral Catheterization   You are scheduled for a Cardiac Catheterization on Thursday, December 11 with Dr. Deatrice Cage.  1. Please arrive at the [facility] (Main Entrance A) at [facility]: [address] [place], [place] [address] at  5:30 AM (This time is 2 hour(s) before your procedure to ensure your preparation).   Free valet parking service is available. You will check in at ADMITTING. The support person will be asked to wait in the waiting room.  It is OK to have someone drop you off and come back when you are ready to be discharged.        Special note: Every effort is made to have your procedure done on time. Please understand that emergencies sometimes delay scheduled procedures.  2. Diet: Nothing to eat after midnight.  3. Hydration:You need to be well hydrated before your procedure. On December 11, you may drink approved liquids (see below) until 2 hours before the procedure, with 16 oz of water as your last intake.   List of approved liquids water, clear juice, clear tea, black coffee, fruit juices, non-citric and without pulp, carbonated beverages, Gatorade, Kool -Aid, plain Jello-O and plain ice popsicles.  4. Labs: You will need to have blood drawn today.  5. Medication instructions in preparation for your procedure:   Contrast Allergy: Yes, Please take Prednisone 50mg  by mouth at: Thirteen hours prior to cath 7:00pm on Wednesday Seven hours prior to cath 1:00am on Thursday And prior to leaving home please take last dose of Prednisone 50mg  and Benadryl  50mg  by mouth.    Stop taking Eliquis  (Apixiban) on Monday, December 8.  Stop taking, Lasix  (Furosemide )  Thursday, December 11,  Hold Xigdou-XR starting on Monday December 8th   On the morning of your procedure,  take Aspirin  81 mg and any morning medicines NOT listed above.  You may use sips of water.  6. Plan to go home the same day, you will only stay overnight if medically necessary. 7. You MUST have a responsible adult to drive you home. 8. An adult MUST be with you the first 24 hours after you arrive home. 9. Bring a current list of your medications, and the last time and date medication taken. 10. Bring ID and current insurance  cards. 11.Please wear clothes that are easy to get on and off and wear slip-on shoes.  Thank you for allowing us  to care for you!   -- [facility]       Exercise Information for Aging Adults Staying physically active is important as you age. Physical activity and exercise can help in maintaining quality of life, health, physical function, and reducing falls. The four types of exercises that are best for older adults are endurance, strength, balance, and flexibility. Contact your health care provider before you start any exercise routine. Ask your health care provider what activities are safe for you. What are the risks? Risks associated with exercising include: Overdoing it. This may lead to sore muscles or fatigue. Falls. Injuries. Dehydration. How to do these exercises Endurance exercises Endurance (aerobic) exercises raise your breathing rate and heart rate. Increasing your endurance helps you do everyday tasks and stay healthy. By improving the health of your body system that includes your heart, lungs, and blood vessels (circulatory system), you may also delay or prevent diseases such as heart disease, diabetes, and weak bones (osteoporosis). Types of endurance exercises include: Sports. Indoor activities, such as using gym equipment, doing water aerobics, or dancing. Outdoor activities, such as biking or jogging. Tasks around the house, such as gardening, yard work, and heavy household chores like cleaning. Walking, such as hiking or walking around your neighborhood. When doing endurance exercises, make sure you: Are aware of your surroundings. Use safety equipment as directed. Dress in layers when exercising outdoors. Drink plenty of water to stay well hydrated. Build up endurance slowly. Start with 10 minutes at a time, and gradually build up to doing 30 minutes at a time. Unless your health care provider gave you different instructions, aim to  exercise for a total of 150 minutes a week. Spread out that time so you are working on endurance 3 or more days a week. Strength exercises Lifting, pulling, or pushing weights helps to strengthen muscles. Having stronger muscles makes it easier to do everyday activities, such as getting up from a chair, climbing stairs, carrying groceries, and playing with grandchildren. Strength exercises include arm and leg exercises that may be done: With weights. Without weights (using your own body weight). With a resistance band. When doing strength exercises: Move smoothly and steadily. Do not suddenly thrust or jerk the weights, the resistance band, or your body. Start with no weights or with light weights, and gradually add more weight over time. Eventually, aim to use weights that are hard or very hard for you to lift. This means that you are able to do 8 repetitions with the weight, and the last few repetitions are very challenging. Lift or push weights into position for 3 seconds, hold the position for 1 second, and then take 3 seconds to return to your starting position. Breathe out (exhale) during difficult movements, like lifting or pushing weights. Breathe in (inhale) to relax your muscles before the next repetition. Consider alternating arms  or legs, especially when you first start strength exercises. Expect some slight muscle soreness after each session. Do strength exercises on 2 or more days a week, for 30 minutes at a time. Avoid exercising the same muscle groups two days in a row. For example, if you work on your leg muscles one day, work on your arm muscles the next day. When you can do two sets of 10-15 repetitions with a certain weight, increase the amount of weight. Balance exercises Balance exercises can help to prevent falls. Balance exercises include: Standing on one foot. Heel-to-toe walk. Balance walk. Tai chi. Make sure you have something sturdy to hold onto while doing balance  exercises, such as a sturdy chair. As your balance improves, challenge yourself by holding on to the chair with one hand instead of two, and then with no hands. Trying exercises with your eyes closed also challenges your balance, but be sure to have a sturdy surface (like a countertop) close by in case you need it. Do balance exercises as often as you want, or as often as directed by your health care provider. Flexibility exercises  Flexibility exercises improve how far you can bend, straighten, move, or rotate parts of your body (range of motion). These exercises also help you do everyday activities such as getting dressed or reaching for objects. Flexibility exercises include stretching different parts of the body, and they may be done in a standing or seated position or on the floor. When stretching, make sure you: Keep a slight bend in your arms and legs. Avoid completely straightening (locking) your joints. Do not stretch so far that you feel pain. You should feel a mild stretching feeling. You may try stretching farther as you become more flexible over time. Relax and breathe between stretches. Hold on to something sturdy for balance as needed. Hold each stretch for 10-30 seconds. Repeat each stretch 3-5 times. General safety tips Exercise in well-lit areas. Do not hold your breath during exercises or stretches. Warm up before exercising, and cool down after exercising. This can help prevent injury. Drink plenty of water during exercise or any activity that makes you sweat. If you are not sure if an exercise is safe for you, or you are not sure how to do an exercise, talk with your health care provider. This is especially important if you have had surgery on muscles, bones, or joints (orthopedic surgery). Where to find more information You can find more information about exercise for older adults from: Your local health department, fitness center, or community center. These facilities may  have programs for aging adults. [employer] on Aging: https://walker.com/ National Council on Aging: www.ncoa.org Summary Staying physically active is important as you age. Doing endurance, strength, balance, and flexibility exercises can help in maintaining quality of life, health, physical function, and reducing falls. Make sure to contact your health care provider before you start any exercise routine. Ask your health care provider what activities are safe for you. This information is not intended to replace advice given to you by your health care provider. Make sure you discuss any questions you have with your health care provider. Document Revised: 08/08/2020 Document Reviewed: 08/08/2020 Elsevier Patient Education  2024 Elsevier Inc.  Physical Activity With Heart Disease Being active has many benefits, especially if you have heart disease. Physical activity can help you do more and feel healthier. Start slowly, and increase the amount of time you spend being active. You should aim for physical activity that: Makes  you breathe harder and raises your heart rate (aerobic activity). Try to get at least 150 minutes of aerobic activity each week. This is about 30 minutes each day, 5 days a week. Helps build muscle strength (strengthening activity). Do this at least 2 times a week. A good rule of thumb is to work hard enough to breathe harder but still be able to carry on a conversation. If you can sing, you may not be working hard enough. You may also want to monitor your heart rate (pulse) and blood pressure. Ask your health care provider what kind of tools you will need to track these. Always talk with your health care provider before starting any new activity program or if you have any changes in your condition. What are the benefits of physical activity? Physical activity can help improve your heart and blood vessel (cardiovascular) health. It can: Lower your blood pressure. Lower your  cholesterol. Control your weight. Help control your blood sugar. Improve the function of your heart and lungs. Reduce your risk of developing blood clots. Physical activity can help improve other aspects of your health. It can: Prevent bone loss. Improve your sleep. Improve your energy level. Reduce stress. What are some types of physical activity I could try? There are many ways to be active. Talk with your health care provider about what types and intensity of activity is right for you. Aerobic activity  Aerobic (cardiovascular) activity can be moderate or vigorous intensity, depending on how hard you are working. Moderate-intensity activity includes: Walking. Slow bicycling. Water aerobics. Dancing. Light gardening or house work. Vigorous-intensity activity includes: Jogging or running. Stair climbing. Swimming laps. Hiking uphill. Heavy gardening, such as digging trenches.  Strengthening activity Strengthening activities work your muscles to build strength. Some examples include: Doing push-ups, sit-ups, or pull-ups. Lifting small weights. Using resistance bands. Yoga.  Flexibility Flexibility activities lengthen your muscles to keep them flexible and less tight and improve your balance. Some examples include: Stretching. Yoga. Tai chi. Evalene barre.  Follow these instructions at home: How to get started Talk with your health care provider about: What types of activities are safe for you. If you should check your pulse or take other precautions during physical activity. Get a calendar. Write down a schedule and plan for your new routine. At the start of your workout, as well as at the end, remember to warm up and cool down to allow a gradual increase or decrease in heart rate and breathing. If you have not been active, begin with sessions that last 10-15 minutes. Gradually work up to sessions that last 20-30 minutes, 5 times a week. Follow all of your health care  provider's recommendations. Take time to find out what works for you. Consider the following: Join a community program, such as a biking group, yoga class, local gym, or swimming pool membership. Be active on your own by downloading free workout applications on a smartphone or other devices, or by purchasing workout DVDs. Be patient with yourself. It takes time to build up strength and lung capacity. Safety Exercise in an indoor, climate-controlled facility, as told by your health care provider. You may need to do this if: There are extreme outdoor conditions, such as heat, humidity, or cold. There is an air pollution advisory. Your local news, board of health, or hospital can provide information on air quality. Take extra precautions as told by your health care provider. This may include: Monitoring your heart rate. Avoiding heavy lifting. Understanding how your  medicines can affect you during physical activity. Certain medicines may cause heat intolerance or changes in blood sugar. Slowing down to rest when you need to. Keeping nitroglycerin spray and tablets with you at all times if you have angina. Use them as told to prevent and treat symptoms. Drink plenty of water before, during, and after physical activity. Know what symptoms may be signs of a problem and stop physical activity right away if you have any of those symptoms. Where to find more information American Heart Association: www.heart.org U.S. Department of Health and Human Services: sixmonthfoodsupply.at Get help right away if you have any of the following during exercise: Chest pain, shortness of breath, or feel very tired. Pain in the arm, shoulder, neck, or jaw. You feel weak, dizzy, or light-headed. An irregular heart rate, or your heart rate is greater than 100 beats per minute (bpm) before exercise. These symptoms may represent a serious problem that is an emergency. Do not wait to see if the symptoms go away. Get medical help  right away. Call your local emergency services (911 in the [place] Do not drive yourself to the hospital.  Summary Physical activity has many benefits, especially if you have heart disease. Before starting an activity program, talk with your health care provider about how often to be active and what type of activity is safe for you. Your physical activity plan may include moderate or vigorous aerobic activity, strengthening activities, and flexibility. Know what symptoms may be signs of a problem. Stop physical activity right away and call emergency services (911 in the [place] if you have any of those symptoms. This information is not intended to replace advice given to you by your health care provider. Make sure you discuss any questions you have with your health care provider. Document Revised: 09/06/2020 Document Reviewed: 09/06/2020 Elsevier Patient Education  2024 [employer].

## 2024-03-13 ENCOUNTER — Ambulatory Visit: Payer: Self-pay | Admitting: Cardiovascular Disease

## 2024-03-13 LAB — BASIC METABOLIC PANEL WITH GFR
BUN/Creatinine Ratio: 13 (ref 10–24)
BUN: 21 mg/dL (ref 8–27)
CO2: 27 mmol/L (ref 20–29)
Calcium: 9.5 mg/dL (ref 8.6–10.2)
Chloride: 101 mmol/L (ref 96–106)
Creatinine, Ser: 1.65 mg/dL — AB (ref 0.76–1.27)
Glucose: 144 mg/dL — AB (ref 70–99)
Potassium: 5 mmol/L (ref 3.5–5.2)
Sodium: 143 mmol/L (ref 134–144)
eGFR: 44 mL/min/1.73 — AB (ref >59)

## 2024-03-13 LAB — CBC
Hematocrit: 45.2 % (ref 37.5–51.0)
Hemoglobin: 14.4 g/dL (ref 13.0–17.7)
MCH: 30.8 pg (ref 26.6–33.0)
MCHC: 31.9 g/dL (ref 31.5–35.7)
MCV: 97 fL (ref 79–97)
Platelets: 166 x10E3/uL (ref 150–450)
RBC: 4.68 x10E6/uL (ref 4.14–5.80)
RDW: 14 % (ref 11.6–15.4)
WBC: 4.5 x10E3/uL (ref 3.4–10.8)

## 2024-03-17 ENCOUNTER — Telehealth: Payer: Self-pay | Admitting: *Deleted

## 2024-03-17 NOTE — Telephone Encounter (Addendum)
 Cardiac Catheterization scheduled at [facility] for: Thursday March 19, 2024 7:30 AM Arrival time [facility] Main Entrance A at: 5:30 AM  Diet: -Nothing to eat after midnight.  Hydration: -May drink clear liquids until 2 hours before the procedure.  Approved liquids: Water, clear tea, black coffee, fruit juices-non-citric and without pulp,Gatorade, plain Jello/popsicles.  CONTRAST ALLERGY: 13 hour Prednisone  and Benadryl  Prep: 03/18/24 Prednisone  50 mg 6:30 PM 03/19/24 Prednisone  50 mg 12:30 AM 03/19/24 Prednisone  50 mg and Benadryl  50 mg-just prior to leaving home for procedure  Advised patient not to drive after taking Benadryl .  Medication instructions: -Hold:  Eliquis -none 03/17/24 until post procedure   Lasix -day before and day of procedure -per protocol GFR < 60 (44)  Xigduo-day of procedure and 48 hours after procedure  -Other usual morning medications can be taken including aspirin  81 mg.  Plan to go home the same day, you will only stay overnight if medically necessary.  You must have responsible adult to drive you home.  Someone must be with you the first 24 hours after you arrive home.  Reviewed procedure instructions with patient.

## 2024-03-17 NOTE — Telephone Encounter (Signed)
 03/12/24 eGFR 44-Per Dr O'Neal-No fluids pre procedure. His cr is stable. Ok to continue avapro . Ok to hold Lasix .

## 2024-03-19 ENCOUNTER — Other Ambulatory Visit: Payer: Self-pay

## 2024-03-19 ENCOUNTER — Ambulatory Visit (HOSPITAL_COMMUNITY)
Admission: RE | Admit: 2024-03-19 | Discharge: 2024-03-19 | Disposition: A | Attending: Cardiovascular Disease | Admitting: Cardiovascular Disease

## 2024-03-19 ENCOUNTER — Encounter (HOSPITAL_COMMUNITY): Admission: RE | Disposition: A | Payer: Self-pay | Attending: Cardiovascular Disease

## 2024-03-19 DIAGNOSIS — Z7984 Long term (current) use of oral hypoglycemic drugs: Secondary | ICD-10-CM | POA: Diagnosis not present

## 2024-03-19 DIAGNOSIS — Z7901 Long term (current) use of anticoagulants: Secondary | ICD-10-CM | POA: Diagnosis not present

## 2024-03-19 DIAGNOSIS — I712 Thoracic aortic aneurysm, without rupture, unspecified: Secondary | ICD-10-CM | POA: Diagnosis not present

## 2024-03-19 DIAGNOSIS — J986 Disorders of diaphragm: Secondary | ICD-10-CM | POA: Diagnosis not present

## 2024-03-19 DIAGNOSIS — I130 Hypertensive heart and chronic kidney disease with heart failure and stage 1 through stage 4 chronic kidney disease, or unspecified chronic kidney disease: Secondary | ICD-10-CM | POA: Diagnosis not present

## 2024-03-19 DIAGNOSIS — I4892 Unspecified atrial flutter: Secondary | ICD-10-CM | POA: Diagnosis not present

## 2024-03-19 DIAGNOSIS — Z91041 Radiographic dye allergy status: Secondary | ICD-10-CM | POA: Diagnosis not present

## 2024-03-19 DIAGNOSIS — Z79899 Other long term (current) drug therapy: Secondary | ICD-10-CM | POA: Diagnosis not present

## 2024-03-19 DIAGNOSIS — J439 Emphysema, unspecified: Secondary | ICD-10-CM | POA: Diagnosis not present

## 2024-03-19 DIAGNOSIS — I5032 Chronic diastolic (congestive) heart failure: Secondary | ICD-10-CM | POA: Diagnosis not present

## 2024-03-19 DIAGNOSIS — J449 Chronic obstructive pulmonary disease, unspecified: Secondary | ICD-10-CM | POA: Diagnosis not present

## 2024-03-19 DIAGNOSIS — E1122 Type 2 diabetes mellitus with diabetic chronic kidney disease: Secondary | ICD-10-CM | POA: Diagnosis not present

## 2024-03-19 DIAGNOSIS — N1832 Chronic kidney disease, stage 3b: Secondary | ICD-10-CM | POA: Diagnosis not present

## 2024-03-19 DIAGNOSIS — I272 Pulmonary hypertension, unspecified: Secondary | ICD-10-CM | POA: Diagnosis present

## 2024-03-19 DIAGNOSIS — E785 Hyperlipidemia, unspecified: Secondary | ICD-10-CM | POA: Diagnosis not present

## 2024-03-19 HISTORY — PX: RIGHT/LEFT HEART CATH AND CORONARY ANGIOGRAPHY: CATH118266

## 2024-03-19 LAB — POCT I-STAT EG7
Acid-Base Excess: 4 mmol/L — ABNORMAL HIGH (ref 0.0–2.0)
Bicarbonate: 30.8 mmol/L — ABNORMAL HIGH (ref 20.0–28.0)
Calcium, Ion: 1.17 mmol/L (ref 1.15–1.40)
HCT: 49 % (ref 39.0–52.0)
Hemoglobin: 16.7 g/dL (ref 13.0–17.0)
O2 Saturation: 72 %
Potassium: 4.2 mmol/L (ref 3.5–5.1)
Sodium: 138 mmol/L (ref 135–145)
TCO2: 32 mmol/L (ref 22–32)
pCO2, Ven: 52.1 mmHg (ref 44–60)
pH, Ven: 7.379 (ref 7.25–7.43)
pO2, Ven: 40 mmHg (ref 32–45)

## 2024-03-19 LAB — POCT I-STAT 7, (LYTES, BLD GAS, ICA,H+H)
Acid-Base Excess: 2 mmol/L (ref 0.0–2.0)
Bicarbonate: 28.8 mmol/L — ABNORMAL HIGH (ref 20.0–28.0)
Calcium, Ion: 1.17 mmol/L (ref 1.15–1.40)
HCT: 48 % (ref 39.0–52.0)
Hemoglobin: 16.3 g/dL (ref 13.0–17.0)
O2 Saturation: 89 %
Potassium: 4 mmol/L (ref 3.5–5.1)
Sodium: 138 mmol/L (ref 135–145)
TCO2: 30 mmol/L (ref 22–32)
pCO2 arterial: 49.6 mmHg — ABNORMAL HIGH (ref 32–48)
pH, Arterial: 7.373 (ref 7.35–7.45)
pO2, Arterial: 60 mmHg — ABNORMAL LOW (ref 83–108)

## 2024-03-19 SURGERY — RIGHT/LEFT HEART CATH AND CORONARY ANGIOGRAPHY
Anesthesia: LOCAL

## 2024-03-19 MED ORDER — SODIUM CHLORIDE 0.9% FLUSH: 3.0000 mL | INTRAVENOUS | Status: DC | PRN

## 2024-03-19 MED ORDER — FENTANYL CITRATE (PF) 100 MCG/2ML IJ SOLN
INTRAMUSCULAR | Status: DC | PRN
  Administered 2024-03-19: 25 ug via INTRAVENOUS

## 2024-03-19 MED ORDER — VERAPAMIL HCL 2.5 MG/ML IV SOLN
INTRAVENOUS | Status: DC | PRN
  Administered 2024-03-19: 10 mL via INTRA_ARTERIAL

## 2024-03-19 MED ORDER — FREE WATER: 500.0000 mL | Freq: Once | Status: DC

## 2024-03-19 MED ORDER — ACETAMINOPHEN 325 MG PO TABS: 650.0000 mg | ORAL_TABLET | ORAL | Status: DC | PRN

## 2024-03-19 MED ORDER — IOHEXOL 350 MG/ML SOLN
INTRAVENOUS | Status: DC | PRN
  Administered 2024-03-19: 70 mL

## 2024-03-19 MED ORDER — HEPARIN (PORCINE) IN NACL 1000-0.9 UT/500ML-% IV SOLN
INTRAVENOUS | Status: DC | PRN
  Administered 2024-03-19: 1000 mL

## 2024-03-19 MED ORDER — ONDANSETRON HCL 4 MG/2ML IJ SOLN: 4.0000 mg | Freq: Four times a day (QID) | INTRAMUSCULAR | Status: DC | PRN

## 2024-03-19 MED ORDER — SODIUM CHLORIDE 0.9% FLUSH: 3.0000 mL | Freq: Two times a day (BID) | INTRAVENOUS | Status: DC

## 2024-03-19 MED ORDER — MIDAZOLAM HCL 2 MG/2ML IJ SOLN
INTRAMUSCULAR | Status: AC
  Filled 2024-03-19: qty 2

## 2024-03-19 MED ORDER — LIDOCAINE HCL (PF) 1 % IJ SOLN
INTRAMUSCULAR | Status: AC
  Filled 2024-03-19: qty 30

## 2024-03-19 MED ORDER — VERAPAMIL HCL 2.5 MG/ML IV SOLN
INTRAVENOUS | Status: AC
  Filled 2024-03-19: qty 2

## 2024-03-19 MED ORDER — MIDAZOLAM HCL (PF) 2 MG/2ML IJ SOLN
INTRAMUSCULAR | Status: DC | PRN
  Administered 2024-03-19: 1 mg via INTRAVENOUS

## 2024-03-19 MED ORDER — HEPARIN SODIUM (PORCINE) 1000 UNIT/ML IJ SOLN
INTRAMUSCULAR | Status: DC | PRN
  Administered 2024-03-19: 5000 [IU] via INTRAVENOUS

## 2024-03-19 MED ORDER — SODIUM CHLORIDE 0.9 % IV SOLN: 250.0000 mL | INTRAVENOUS | Status: DC | PRN

## 2024-03-19 MED ORDER — LIDOCAINE HCL (PF) 1 % IJ SOLN
INTRAMUSCULAR | Status: DC | PRN
  Administered 2024-03-19: 5 mL via INTRADERMAL

## 2024-03-19 MED ORDER — FENTANYL CITRATE (PF) 100 MCG/2ML IJ SOLN
INTRAMUSCULAR | Status: AC
  Filled 2024-03-19: qty 2

## 2024-03-19 MED ORDER — ASPIRIN 81 MG PO CHEW: 81.0000 mg | CHEWABLE_TABLET | Freq: Once | ORAL | Status: DC

## 2024-03-19 MED ORDER — HEPARIN SODIUM (PORCINE) 1000 UNIT/ML IJ SOLN
INTRAMUSCULAR | Status: AC
  Filled 2024-03-19: qty 10

## 2024-03-19 SURGICAL SUPPLY — 12 items
CATH BALLN WEDGE 5F 110CM (CATHETERS) IMPLANT
CATH INFINITI 5 FR AL2 (CATHETERS) IMPLANT
CATH INFINITI 5 FR AR1 MOD (CATHETERS) IMPLANT
CATH INFINITI AMBI 5FR JK (CATHETERS) IMPLANT
CATH INFINITI JR4 5F (CATHETERS) IMPLANT
DEVICE RAD COMP TR BAND LRG (VASCULAR PRODUCTS) IMPLANT
GLIDESHEATH SLEND SS 6F .021 (SHEATH) IMPLANT
GUIDEWIRE INQWIRE 1.5J.035X260 (WIRE) IMPLANT
KIT SYRINGE INJ CVI SPIKEX1 (MISCELLANEOUS) IMPLANT
PACK CARDIAC CATHETERIZATION (CUSTOM PROCEDURE TRAY) ×1 IMPLANT
SET ATX-X65L (MISCELLANEOUS) IMPLANT
SHEATH GLIDE SLENDER 4/5FR (SHEATH) IMPLANT

## 2024-03-19 NOTE — Progress Notes (Signed)
 Patient and wife was given discharge instructions. Both verbalized understanding.

## 2024-03-19 NOTE — Interval H&P Note (Signed)
 History and Physical Interval Note:  03/19/2024 7:41 AM  Chris Lucas  has presented today for surgery, with the diagnosis of chronic hf   pul htn.  The various methods of treatment have been discussed with the patient and family. After consideration of risks, benefits and other options for treatment, the patient has consented to  Procedures: RIGHT/LEFT HEART CATH AND CORONARY ANGIOGRAPHY (N/A) as a surgical intervention.  The patient's history has been reviewed, patient examined, no change in status, stable for surgery.  I have reviewed the patient's chart and labs.  Questions were answered to the patient's satisfaction.     [name]

## 2024-03-19 NOTE — Discharge Instructions (Signed)

## 2024-03-20 ENCOUNTER — Encounter (HOSPITAL_COMMUNITY): Payer: Self-pay | Admitting: Cardiovascular Disease

## 2024-03-23 ENCOUNTER — Telehealth: Payer: Self-pay | Admitting: Cardiovascular Disease

## 2024-03-23 NOTE — Telephone Encounter (Signed)
 Pt was given medication to take but unsure if he should continue or stop. He is also to take two fluid pills instead of the one pt was taking. Please advise.

## 2024-03-23 NOTE — Telephone Encounter (Signed)
 Spoke with patient regarding medication changes at last visit and since heart cath. Pt encouraged to continue taking hydralazine  3 times a day and furosemide  twice daily, per Dr. Barbaraann notes. Also encouraged pt to stay hydrated, and keep a log of BP readings 1-2 hours after taking medications morning and night. Pt verbalized understanding.

## 2024-04-14 ENCOUNTER — Telehealth: Payer: Self-pay | Admitting: Student in an Organized Health Care Education/Training Program

## 2024-04-14 NOTE — Telephone Encounter (Signed)
 Called Chris Lucas to f/u with him now that he has had a fairly extensive evaluation with [facility] without any obstructive CAD and only moderate PAH.  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  Reviewed her prior discussion regarding ablation and provided my contact information again for him to call if he wants to proceed with ablation.  I have not seen any evidence of AF despite his severe LAE.  I will plan for a flutter ablation without left atrial ablation and less the AFL was left-sided.  He is interested in potentially proceeding and said that he was going to consider it further in clinic back if he wants to.

## 2024-04-22 ENCOUNTER — Telehealth: Payer: Self-pay | Admitting: Cardiovascular Disease

## 2024-04-22 ENCOUNTER — Other Ambulatory Visit: Payer: Self-pay | Admitting: Cardiovascular Disease

## 2024-04-22 MED ORDER — CLONIDINE HCL 0.1 MG PO TABS: ORAL_TABLET | ORAL | 3 refills | Status: DC

## 2024-04-22 NOTE — Telephone Encounter (Signed)
" °*  STAT* If patient is at the pharmacy, call can be transferred to refill team.   1. Which medications need to be refilled? (please list name of each medication and dose if known)   cloNIDine  (CATAPRES ) 0.1 MG tablet    2. Would you like to learn more about the convenience, safety, & potential cost savings by using the [facility]? No    3. Are you open to using the [facility] (Type [facility]. No    4. Which pharmacy/location (including street and city if local pharmacy) is medication to be sent to? [id number] - [place],  - [address] AT [address]     5. Do they need a 30 day or 90 day supply? 90 day   Pt is out of medication   "

## 2024-04-22 NOTE — Telephone Encounter (Signed)
 Pt c/o medication issue:  1. Name of Medication:   atorvastatin  (LIPITOR) 40 MG tablet    2. How are you currently taking this medication (dosage and times per day)? As prescribed   3. Are you having a reaction (difficulty breathing--STAT)? No   4. What is your medication issue? Pt not sure if he should be taking this medication or not

## 2024-04-22 NOTE — Telephone Encounter (Signed)
 RX sent in.

## 2024-04-22 NOTE — Telephone Encounter (Signed)
 Left message for pt to call.

## 2024-04-22 NOTE — Telephone Encounter (Signed)
" °*  STAT* If patient is at the pharmacy, call can be transferred to refill team.   1. Which medications need to be refilled? (please list name of each medication and dose if known) carvedilol  (COREG ) 25 MG tablet   irbesartan  (AVAPRO ) 300 MG tablet    2. Would you like to learn more about the convenience, safety, & potential cost savings by using the [facility]? No    3. Are you open to using the [facility] ([facility]. No    4. Which pharmacy/location (including street and city if local pharmacy) is medication to be sent to? [id number] - [place], [place] - [address] AT [address]     5. Do they need a 30 day or 90 day supply? 90 day   "

## 2024-04-23 MED ORDER — CLONIDINE HCL 0.1 MG PO TABS: ORAL_TABLET | ORAL | 3 refills | Status: DC

## 2024-04-27 NOTE — Progress Notes (Unsigned)
 " Cardiology Office Note:  .   Date:  05/01/2024  ID:  DEFORREST BOGLE, DOB [birth year], MRN [id number] PCP: Shona Norleen PEDLAR, MD  [facility] Providers Cardiologist:  Darryle ONEIDA Decent, MD Electrophysiologist:  Donnice DELENA Primus, MD   History of Present Illness: .    Chief Complaint  Patient presents with   Follow-up    Chris Lucas is a 74 y.o. male with below history who presents for follow-up.   History of Present Illness   Chris Lucas is a 74 year old male with HFpEF, COPD, HTN, pulmonary hypertension and atrial flutter who presents for follow-up.  He has a history of moderate pulmonary hypertension; prior catheterization showed a wedge pressure of 17 mmHg and a mean pulmonary artery pressure of 38 mmHg. He experiences fluid retention, primarily in his legs, but notes a weight loss of ten pounds since the last visit, indicating some improvement in fluid status. No significant dizziness, lightheadedness, or chest pain is reported, and he has no more trouble breathing than usual.  He has persistent atrial flutter and has previously undergone a catheterization procedure. He feels better over the last few days, with increased activity and a reduction in shortness of breath.  He has a venous stasis ulcer on his left leg, which is described as having a scab that falls off, leaving a raw area.  He is currently on a regimen that includes Coreg  25 mg twice daily and irbesartan  300 mg daily. He is taking clonidine  and hydralazine . He is also on Eliquis  for anticoagulation.           Problem List Atrial flutter/fib -DCCV 08/2021 -DCCV 11/29/2023 2. HTN 3. DM -A1c 8.0 4. 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 Thoracic aortic aneurysm  -45 mm 06/08/2023 7. HFpEF -no CAD LHC 03/19/2024 8. Pulm HTN -mPAP 38 mmHG (moderate) -mixed Group 2/3 9. HLD -T chol 86, TG 65, HDL 39, LDL 32 10. COPD -moderate to severe obstructive disease 2023     ROS: All other ROS reviewed and negative. Pertinent positives noted in the HPI.     Studies Reviewed: SABRA   EKG Interpretation Date/Time:  Friday May 01 2024 09:27:51 EST Ventricular Rate:  76 PR Interval:    QRS Duration:  88 QT Interval:  392 QTC Calculation: 441 R Axis:   69  Text Interpretation: Atrial flutter with 4:1 A-V conduction Septal infarct (cited on or before 12-Mar-2024) ST & T wave abnormality, consider inferior ischemia ST & T wave abnormality, consider anterolateral ischemia When compared with ECG of 12-Mar-2024 09:48, QRS axis Shifted left Questionable change in initial forces of Septal leads ST more depressed Inferior leads T wave inversion more evident in Anterior leads T wave inversion less evident in Lateral leads Confirmed by Decent Darryle [phone]) on 05/01/2024 9:28:31 AM    RHC/LHC 03/19/2024 1.  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 2.  Left ventricular angiography was not performed.  EF was normal by echo. 3.  Right heart catheterization showed mildly elevated right and left-sided filling pressures, moderate pulmonary hypertension and normal cardiac output.   RA: 9 mmHg RV: 64/14 mmHg PW: 17 mmHg PA: 62/24 with a mean of 38 mmHg.  Pulmonary vascular resistance of 2.33 Woods units. Cardiac output: 8.99 with an index of 3.99.  TTE 11/22/2023  1. Left ventricular ejection fraction, by estimation, is 60 to 65%. The  left ventricle has normal function.  The left ventricle has no regional  wall motion abnormalities. There is mild left ventricular hypertrophy.  Left ventricular diastolic function  could not be evaluated. Elevated left atrial pressure. There is the  interventricular septum is flattened in systole and diastole, consistent  with right ventricular pressure and volume overload.   2. Right ventricular systolic function is normal. The right ventricular  size is normal. There is severely elevated pulmonary artery  systolic  pressure.   3. Left atrial size was severely dilated.   4. Right atrial size was mildly dilated.   5. The mitral valve is normal in structure. Mild mitral valve  regurgitation. No evidence of mitral stenosis.   6. The aortic valve is tricuspid. Aortic valve regurgitation is not  visualized. Aortic valve sclerosis is present, with no evidence of aortic  valve stenosis.   7. Aortic dilatation noted. There is mild dilatation of the ascending  aorta, measuring 45 mm.   8. The inferior vena cava is dilated in size with <50% respiratory  variability, suggesting right atrial pressure of 15 mmHg.   9. Cannot exclude a small PFO.   Physical Exam:   VS:  BP (!) 76/52 (BP Location: Right Arm, Patient Position: Sitting, Cuff Size: Large)   Ht 6' 1 (1.854 m)   Wt 221 lb 12.8 oz (100.6 kg)   SpO2 98%   BMI 29.26 kg/m    Wt Readings from Last 3 Encounters:  05/01/24 221 lb 12.8 oz (100.6 kg)  03/19/24 223 lb (101.2 kg)  03/12/24 233 lb 3.2 oz (105.8 kg)    GEN: Well nourished, well developed in no acute distress NECK: No JVD; No carotid bruits CARDIAC: irregular rhythm, no murmurs, rubs, gallops RESPIRATORY:  Clear to auscultation without rales, wheezing or rhonchi  ABDOMEN: Soft, non-tender, non-distended EXTREMITIES:  2+ pitting edema, L > R, venous ulcer on the LLE ASSESSMENT AND PLAN: .   Assessment and Plan    Persistent atrial flutter Contributing to fluid retention and possibly exacerbating pulmonary hypertension. Previous cardioversion was unsuccessful. Ablation recommended due to high cure rate and potential fluid management improvement. - Referred to Dr. Almetta for atrial flutter ablation. - Continue Eliquis  for anticoagulation.  Chronic diastolic heart failure Fluid retention primarily in legs. Weight loss indicates effective fluid management. Low blood pressure likely due to overmedication. - Stopped clonidine  and hydralazine . - Switched Lasix  to torsemide  40 mg  daily. - Monitor blood pressure at home. - Referred to pulmonary specialist for COPD management.  Pulmonary hypertension Moderate pulmonary hypertension likely mixed, related to left-sided heart disease and COPD. No significant coronary artery disease on catheterization. - Referred to pulmonary specialist for COPD management.  Primary hypertension Blood pressure low at 76/50 mmHg, likely due to overmedication. No dizziness or lightheadedness reported. - Stopp clonidine  and hydralazine . - Continue irbesartan  300 mg daily. - Monitor blood pressure at home.  Venous stasis ulcer of left calf Venous stasis ulcer on left calf due to venous insufficiency and fluid retention. Risk of infection if not managed. - Referred to primary care physician for [facility] boot application. - Will consider referral to wound clinic for further management.  Chronic kidney disease stage 3a Chronic kidney disease stage 3a. - Ordered BMP to assess kidney function.                Follow-up: Return in about 2 weeks (around 05/15/2024).  Signed, Darryle DASEN. Barbaraann, MD, [facility]  [facility]  [address] [place], [place] [address] [phone]  12:31 PM   "

## 2024-04-29 NOTE — Telephone Encounter (Signed)
 After viewing chart, looks like pt should have enough medications until March and May, 2026.  Will forward to Dr. Rosslyn RN, pt scheduled 05/01/24, refills can be sent at that time.

## 2024-05-01 ENCOUNTER — Ambulatory Visit: Attending: Cardiovascular Disease | Admitting: Cardiovascular Disease

## 2024-05-01 ENCOUNTER — Encounter: Payer: Self-pay | Admitting: Cardiovascular Disease

## 2024-05-01 VITALS — BP 76/52 | Ht 73.0 in | Wt 221.8 lb

## 2024-05-01 DIAGNOSIS — I272 Pulmonary hypertension, unspecified: Secondary | ICD-10-CM | POA: Diagnosis not present

## 2024-05-01 DIAGNOSIS — I4819 Other persistent atrial fibrillation: Secondary | ICD-10-CM

## 2024-05-01 DIAGNOSIS — N1831 Chronic kidney disease, stage 3a: Secondary | ICD-10-CM

## 2024-05-01 DIAGNOSIS — I10 Essential (primary) hypertension: Secondary | ICD-10-CM | POA: Diagnosis not present

## 2024-05-01 DIAGNOSIS — I5032 Chronic diastolic (congestive) heart failure: Secondary | ICD-10-CM

## 2024-05-01 DIAGNOSIS — E785 Hyperlipidemia, unspecified: Secondary | ICD-10-CM

## 2024-05-01 DIAGNOSIS — I872 Venous insufficiency (chronic) (peripheral): Secondary | ICD-10-CM

## 2024-05-01 DIAGNOSIS — L97229 Non-pressure chronic ulcer of left calf with unspecified severity: Secondary | ICD-10-CM | POA: Diagnosis not present

## 2024-05-01 DIAGNOSIS — I7121 Aneurysm of the ascending aorta, without rupture: Secondary | ICD-10-CM

## 2024-05-01 LAB — BASIC METABOLIC PANEL WITH GFR
BUN/Creatinine Ratio: 13 (ref 10–24)
BUN: 27 mg/dL (ref 8–27)
CO2: 27 mmol/L (ref 20–29)
Calcium: 9.5 mg/dL (ref 8.6–10.2)
Chloride: 98 mmol/L (ref 96–106)
Creatinine, Ser: 2.06 mg/dL — ABNORMAL HIGH (ref 0.76–1.27)
Glucose: 172 mg/dL — ABNORMAL HIGH (ref 70–99)
Potassium: 4.3 mmol/L (ref 3.5–5.2)
Sodium: 140 mmol/L (ref 134–144)
eGFR: 34 mL/min/1.73 — ABNORMAL LOW

## 2024-05-01 MED ORDER — IRBESARTAN 300 MG PO TABS: 300.0000 mg | ORAL_TABLET | Freq: Every day | ORAL | 3 refills | Status: AC

## 2024-05-01 MED ORDER — TORSEMIDE 20 MG PO TABS: 40.0000 mg | ORAL_TABLET | Freq: Every day | ORAL | 3 refills | Status: AC

## 2024-05-01 MED ORDER — CARVEDILOL 25 MG PO TABS: 25.0000 mg | ORAL_TABLET | Freq: Two times a day (BID) | ORAL | 3 refills | Status: AC

## 2024-05-01 NOTE — Patient Instructions (Signed)
 Medication Instructions:  Your physician has recommended you make the following change in your medication:   -Stop taking Clonidine  (catapres ).  -Stop taking Hydralazine  (apresoline ).  -Stop taking Furosemide  (lasix ).  -Start taking Torsemide  (demadex ) 40mg  once daily.  *If you need a refill on your cardiac medications before your next appointment, please call your pharmacy*  Lab Work: Today- BMET  If you have labs (blood work) drawn today and your tests are completely normal, you will receive your results only by: MyChart Message (if you have MyChart) OR A paper copy in the mail If you have any lab test that is abnormal or we need to change your treatment, we will call you to review the results.   Follow-Up: At [facility], you and your health needs are our priority.  As part of our continuing mission to provide you with exceptional heart care, our providers are all part of one team.  This team includes your primary Cardiologist (physician) and Advanced Practice Providers or APPs (Physician Assistants and Nurse Practitioners) who all work together to provide you with the care you need, when you need it.  Your next appointment:   2 week(s)  Provider:   Lum Louis, NP, Aline Door, PA-C, Kathleen [name], PA-C, Hao Meng, PA-C, Damien Braver, NP, or Katlyn West, NP         Then, Darryle ONEIDA Decent, MD will plan to see you again in 6 month(s).    We recommend signing up for the patient portal called MyChart.  Sign up information is provided on this After Visit Summary.  MyChart is used to connect with patients for Virtual Visits (Telemedicine).  Patients are able to view lab/test results, encounter notes, upcoming appointments, etc.  Non-urgent messages can be sent to your provider as well.   To learn more about what you can do with MyChart, go to [email or web address].   Other Instructions

## 2024-05-01 NOTE — Addendum Note (Signed)
 Addended by: LORRENE FEDERICO CROME on: 05/01/2024 01:15 PM   Modules accepted: Orders

## 2024-05-03 ENCOUNTER — Ambulatory Visit: Payer: Self-pay | Admitting: Cardiovascular Disease

## 2024-05-03 NOTE — Progress Notes (Signed)
 Creatinine slightly above baseline.  Will continue current medications.  He should have follow-up in 2 weeks.  We can recheck his kidney function at that time.  I will send a MyChart message.  Overall his GFR is not far off from baseline.  Suspect this is multifactorial in the setting of heart failure and chronic kidney disease.  Signed, Darryle DASEN. Barbaraann, MD, [facility]  [facility]  [address] [place], [place] [address] [phone]  12:07 PM

## 2024-05-15 ENCOUNTER — Ambulatory Visit: Admitting: Physician Assistant

## 2024-05-15 ENCOUNTER — Encounter: Payer: Self-pay | Admitting: Physician Assistant

## 2024-05-15 VITALS — BP 118/80 | HR 73 | Ht 73.0 in | Wt 224.2 lb

## 2024-05-15 DIAGNOSIS — Z79899 Other long term (current) drug therapy: Secondary | ICD-10-CM

## 2024-05-15 DIAGNOSIS — I10 Essential (primary) hypertension: Secondary | ICD-10-CM

## 2024-05-15 LAB — BASIC METABOLIC PANEL WITH GFR
BUN/Creatinine Ratio: 17 (ref 10–24)
BUN: 31 mg/dL — ABNORMAL HIGH (ref 8–27)
CO2: 25 mmol/L (ref 20–29)
Calcium: 9.1 mg/dL (ref 8.6–10.2)
Chloride: 99 mmol/L (ref 96–106)
Creatinine, Ser: 1.79 mg/dL — ABNORMAL HIGH (ref 0.76–1.27)
Glucose: 119 mg/dL — ABNORMAL HIGH (ref 70–99)
Potassium: 4.6 mmol/L (ref 3.5–5.2)
Sodium: 142 mmol/L (ref 134–144)
eGFR: 40 mL/min/{1.73_m2} — ABNORMAL LOW

## 2024-05-15 NOTE — Patient Instructions (Signed)
 Medication Instructions:  Your physician recommends that you continue on your current medications as directed. Please refer to the Current Medication list given to you today.  *If you need a refill on your cardiac medications before your next appointment, please call your pharmacy*  Lab Work: Please complete a BMP in our first floor lab today before you leave.  If you have labs (blood work) drawn today and your tests are completely normal, you will receive your results only by: MyChart Message (if you have MyChart) OR A paper copy in the mail If you have any lab test that is abnormal or we need to change your treatment, we will call you to review the results.  Testing/Procedures: None.  Follow-Up: At [facility], you and your health needs are our priority.  As part of our continuing mission to provide you with exceptional heart care, our providers are all part of one team.  This team includes your primary Cardiologist (physician) and Advanced Practice Providers or APPs (Physician Assistants and Nurse Practitioners) who all work together to provide you with the care you need, when you need it.  Your next appointment:   3 month(s)  Provider:   Darryle ONEIDA Decent, MD   **Please also scheduled a 3 month follow up appointment with Dr. Almetta.**      Other Instructions You have been referred to Dr. Richardine in Pulmonology and to Wound Care. Please make sure to keep those appointments.  If you notice a 3 pound weight gain overnight, or a 5 pound weight gain in a week, please take an extra dose of 20 mg torsemide  in the afternoon.

## 2024-05-15 NOTE — Progress Notes (Unsigned)
 " Cardiology Office Note   Date:  05/15/2024  ID:  Chris Lucas, Chris Lucas May 29, 1950, MRN [id number] PCP: Shona Norleen PEDLAR, MD  [facility] Providers Cardiologist:  Darryle ONEIDA Decent, MD Electrophysiologist:  Donnice DELENA Primus, MD { Click to update primary MD,subspecialty MD or APP then REFRESH:1}    History of Present Illness Chris Lucas is a 74 y.o. male with past medical history of hypertension, hyperlipidemia, DM2, CKD stage III, lung carcinoid tumor s/p LUL resection, persistent typical atrial flutter on Eliquis , thoracic aortic aneurysm measuring 4.5 cm, HFpEF and pulmonary hypertension with cor pulmonale.  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  He underwent DCCV in May 2023 and again in August 2025.  Patient was seen by Dr. Primus of EP service on 01/06/2024, atrial flutter ablation was discussed with the patient.  Patient was seen by Dr. Decent on 03/12/2024, it was recommended to proceed with left and right heart cath to preclude cardiac etiology behind shortness of breath versus pulmonary etiology.  He underwent the planned left and right heart cath on 03/19/2024 which showed no significant coronary artery disease, right heart catheter showed mildly elevated right and left-sided filling pressure, moderate pulmonary hypertension and normal cardiac output.  Pulmonary hypertension seems to be a mixed arterial and venous etiology.  RV pressure 64 over 14 mmHg.  Cardiac output 8.99, cardiac index 3.99.  PA pressure 62/24 with mean PA pressure 38 mmHg.  Pulmonary vascular resistance 2.33 Woods unit.  Patient was seen by Dr. Decent again on 05/01/2024, blood pressure was low that day at 76/52.  He was on clonidine , carvedilol , irbesartan  and hydralazine .  He was referred to pulmonology service for COPD management.  Lasix  was switched to torsemide  40 mg daily for fluid retention in the leg.  Hydralazine  and  clonidine  were discontinued due to hypotension.  Patient was also referred to wound clinic with recommendation to start Unna boot for venous stasis ulcer of the left calf.  Blood work showed creatinine trended up to 2.06.  Patient presents today for follow-up accompanied by wife.  He is breathing seems to be better after switching to torsemide .  Initial blood pressure was elevated, repeat manual blood pressure by myself was 118/80.  He was hypotensive during the last office visit, likely contributing to the elevation of the creatinine.  Baseline creatinine has been around 1.4-1.6 range.  He denies any chest pain.  He has chronic left lower extremity swelling, there is no significant swelling in the right lower extremity.  He also has a venous stasis ulcer on the lateral side of the left lower extremity.  After obtaining permission from patient, I obtained a photo of his venous stasis ulcer to posterior in this office note.  He has upcoming visit with wound clinic.  Patient is also waiting call to schedule initial appointment with pulmonology service as well.  I will obtain basic metabolic panel today.  He can follow-up with Dr. Decent in 3 months and Dr. Primus in 3 to 4 months.  He is not sure if he will go through with the ablation procedure at this time.   ROS: ***  Studies Reviewed      *** Risk Assessment/Calculations {Does this patient have ATRIAL FIBRILLATION?:[id number]}         Physical Exam VS:  BP 118/80   Pulse 73   Ht 6' 1 (1.854 m)   Wt 224 lb 3.2 oz (  101.7 kg)   SpO2 98%   BMI 29.58 kg/m        Wt Readings from Last 3 Encounters:  05/15/24 224 lb 3.2 oz (101.7 kg)  05/01/24 221 lb 12.8 oz (100.6 kg)  03/19/24 223 lb (101.2 kg)    GEN: Well nourished, well developed in no acute distress NECK: No JVD; No carotid bruits CARDIAC: ***RRR, no murmurs, rubs, gallops RESPIRATORY:  Clear to auscultation without rales, wheezing or rhonchi  ABDOMEN: Soft, non-tender,  non-distended EXTREMITIES:  No edema; No deformity   ASSESSMENT AND PLAN ***    {Are you ordering a CV Procedure (e.g. stress test, cath, DCCV, TEE, etc)?   Press F2        :[id number]}  Dispo: ***  Signed, Scot Ford, PA  "

## 2024-06-04 ENCOUNTER — Encounter (HOSPITAL_BASED_OUTPATIENT_CLINIC_OR_DEPARTMENT_OTHER): Admitting: Internal Medicine

## 2024-06-09 ENCOUNTER — Other Ambulatory Visit (HOSPITAL_COMMUNITY)

## 2024-06-09 ENCOUNTER — Inpatient Hospital Stay

## 2024-06-11 ENCOUNTER — Other Ambulatory Visit

## 2024-06-11 ENCOUNTER — Ambulatory Visit: Admitting: Internal Medicine

## 2024-06-16 ENCOUNTER — Inpatient Hospital Stay: Admitting: Internal Medicine

## 2024-08-12 ENCOUNTER — Ambulatory Visit: Admitting: Cardiovascular Disease

## 2024-08-20 ENCOUNTER — Ambulatory Visit: Admitting: Student in an Organized Health Care Education/Training Program
